# Patient Record
Sex: Female | Born: 1960
Health system: Southern US, Community
[De-identification: ages and names within clinical notes are randomized; demographics above are authoritative.]

## PROBLEM LIST (undated history)

## (undated) DIAGNOSIS — K589 Irritable bowel syndrome without diarrhea: Secondary | ICD-10-CM

## (undated) DIAGNOSIS — K259 Gastric ulcer, unspecified as acute or chronic, without hemorrhage or perforation: Secondary | ICD-10-CM

## (undated) DIAGNOSIS — I480 Paroxysmal atrial fibrillation: Secondary | ICD-10-CM

## (undated) DIAGNOSIS — Z860100 Personal history of colon polyps, unspecified: Secondary | ICD-10-CM

## (undated) DIAGNOSIS — K3184 Gastroparesis: Secondary | ICD-10-CM

## (undated) DIAGNOSIS — R0600 Dyspnea, unspecified: Secondary | ICD-10-CM

## (undated) DIAGNOSIS — E785 Hyperlipidemia, unspecified: Secondary | ICD-10-CM

## (undated) DIAGNOSIS — E079 Disorder of thyroid, unspecified: Secondary | ICD-10-CM

## (undated) DIAGNOSIS — F32A Depression, unspecified: Secondary | ICD-10-CM

## (undated) DIAGNOSIS — G4700 Insomnia, unspecified: Secondary | ICD-10-CM

## (undated) DIAGNOSIS — K8020 Calculus of gallbladder without cholecystitis without obstruction: Secondary | ICD-10-CM

## (undated) DIAGNOSIS — F419 Anxiety disorder, unspecified: Secondary | ICD-10-CM

## (undated) DIAGNOSIS — K219 Gastro-esophageal reflux disease without esophagitis: Secondary | ICD-10-CM

## (undated) DIAGNOSIS — K5792 Diverticulitis of intestine, part unspecified, without perforation or abscess without bleeding: Secondary | ICD-10-CM

## (undated) DIAGNOSIS — R079 Chest pain, unspecified: Secondary | ICD-10-CM

## (undated) DIAGNOSIS — G43909 Migraine, unspecified, not intractable, without status migrainosus: Secondary | ICD-10-CM

## (undated) DIAGNOSIS — T7840XA Allergy, unspecified, initial encounter: Secondary | ICD-10-CM

## (undated) DIAGNOSIS — R569 Unspecified convulsions: Secondary | ICD-10-CM

## (undated) DIAGNOSIS — I639 Cerebral infarction, unspecified: Secondary | ICD-10-CM

## (undated) DIAGNOSIS — Z950 Presence of cardiac pacemaker: Secondary | ICD-10-CM

## (undated) DIAGNOSIS — R55 Syncope and collapse: Secondary | ICD-10-CM

## (undated) DIAGNOSIS — K635 Polyp of colon: Secondary | ICD-10-CM

## (undated) DIAGNOSIS — R519 Headache, unspecified: Secondary | ICD-10-CM

## (undated) DIAGNOSIS — R011 Cardiac murmur, unspecified: Secondary | ICD-10-CM

## (undated) DIAGNOSIS — D649 Anemia, unspecified: Secondary | ICD-10-CM

## (undated) DIAGNOSIS — F329 Major depressive disorder, single episode, unspecified: Secondary | ICD-10-CM

## (undated) HISTORY — DX: Unspecified convulsions: R56.9

## (undated) HISTORY — PX: COLONOSCOPY: SHX174

## (undated) HISTORY — PX: UPPER GASTROINTESTINAL ENDOSCOPY: SHX188

## (undated) HISTORY — DX: Dyspnea, unspecified: R06.00

## (undated) HISTORY — DX: Diverticulitis of intestine, part unspecified, without perforation or abscess without bleeding: K57.92

## (undated) HISTORY — DX: Gastro-esophageal reflux disease without esophagitis: K21.9

## (undated) HISTORY — DX: Gastric ulcer, unspecified as acute or chronic, without hemorrhage or perforation: K25.9

## (undated) HISTORY — DX: Headache, unspecified: R51.9

## (undated) HISTORY — DX: Cardiac murmur, unspecified: R01.1

## (undated) HISTORY — DX: Migraine, unspecified, not intractable, without status migrainosus: G43.909

## (undated) HISTORY — DX: Gastroparesis: K31.84

## (undated) HISTORY — DX: Paroxysmal atrial fibrillation: I48.0

## (undated) HISTORY — DX: Hyperlipidemia, unspecified: E78.5

## (undated) HISTORY — PX: ELECTROPHYSIOLOGY STUDY: EP1205

## (undated) HISTORY — DX: Depression, unspecified: F32.A

## (undated) HISTORY — PX: BREAST EXCISIONAL BIOPSY: SUR124

## (undated) HISTORY — DX: Personal history of colon polyps, unspecified: Z86.0100

## (undated) HISTORY — DX: Anemia, unspecified: D64.9

## (undated) HISTORY — DX: Syncope and collapse: R55

## (undated) HISTORY — DX: Allergy, unspecified, initial encounter: T78.40XA

## (undated) HISTORY — DX: Irritable bowel syndrome, unspecified: K58.9

## (undated) HISTORY — DX: Disorder of thyroid, unspecified: E07.9

## (undated) HISTORY — DX: Chest pain, unspecified: R07.9

## (undated) HISTORY — DX: Calculus of gallbladder without cholecystitis without obstruction: K80.20

---
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
## 1982-10-27 HISTORY — PX: APPENDECTOMY: SHX54

## 1991-10-28 HISTORY — PX: ABDOMINAL HYSTERECTOMY: SHX81

## 1995-10-28 DIAGNOSIS — I639 Cerebral infarction, unspecified: Secondary | ICD-10-CM

## 1995-10-28 HISTORY — DX: Cerebral infarction, unspecified: I63.9

## 2008-10-27 DIAGNOSIS — G4700 Insomnia, unspecified: Secondary | ICD-10-CM | POA: Insufficient documentation

## 2008-10-27 HISTORY — PX: CHOLECYSTECTOMY: SHX55

## 2010-10-27 HISTORY — PX: CARDIAC CATHETERIZATION: SHX172

## 2010-10-27 HISTORY — PX: ATRIAL ABLATION SURGERY: SHX560

## 2011-04-01 DIAGNOSIS — F4310 Post-traumatic stress disorder, unspecified: Secondary | ICD-10-CM | POA: Insufficient documentation

## 2011-10-28 HISTORY — PX: LOOP RECORDER IMPLANT: SHX5954

## 2012-08-27 DIAGNOSIS — I480 Paroxysmal atrial fibrillation: Secondary | ICD-10-CM | POA: Insufficient documentation

## 2013-05-06 DIAGNOSIS — K589 Irritable bowel syndrome without diarrhea: Secondary | ICD-10-CM | POA: Insufficient documentation

## 2014-05-05 DIAGNOSIS — K3184 Gastroparesis: Secondary | ICD-10-CM | POA: Insufficient documentation

## 2014-05-05 DIAGNOSIS — K219 Gastro-esophageal reflux disease without esophagitis: Secondary | ICD-10-CM | POA: Insufficient documentation

## 2014-09-06 HISTORY — PX: PACEMAKER INSERTION: SHX728

## 2016-10-27 DIAGNOSIS — K635 Polyp of colon: Secondary | ICD-10-CM

## 2016-10-27 HISTORY — DX: Polyp of colon: K63.5

## 2018-01-27 LAB — HM MAMMOGRAPHY

## 2018-01-29 LAB — BASIC METABOLIC PANEL
BUN: 10 (ref 4–21)
CO2: 26 — AB (ref 13–22)
Chloride: 108 (ref 99–108)
Creatinine: 0.6 (ref <=1.1)
Glucose: 95
Potassium: 4.1 (ref 3.4–5.3)
Sodium: 140 (ref 137–147)

## 2018-01-29 LAB — CBC AND DIFFERENTIAL
HCT: 40 (ref 36–46)
Hemoglobin: 13.6 (ref 12.0–16.0)
Platelets: 221 (ref 150–399)
WBC: 11

## 2018-01-29 LAB — TSH: TSH: 0.66 (ref <=5.90)

## 2018-01-29 LAB — LIPID PANEL
Cholesterol: 168 (ref 0–200)
HDL: 50 (ref 35–70)
LDL Cholesterol: 96
Triglycerides: 111 (ref 40–160)

## 2018-01-29 LAB — HEPATIC FUNCTION PANEL
ALT: 15 (ref 7–35)
AST: 21 (ref 13–35)
Alkaline Phosphatase: 75.1 (ref 25–125)

## 2018-01-29 LAB — CBC: RBC: 4.3 (ref 3.87–5.11)

## 2018-05-07 LAB — HM PAP SMEAR

## 2018-08-19 LAB — HM DEXA SCAN

## 2019-01-13 ENCOUNTER — Encounter (HOSPITAL_COMMUNITY): Payer: Self-pay

## 2019-01-13 ENCOUNTER — Other Ambulatory Visit: Payer: Self-pay

## 2019-01-13 ENCOUNTER — Ambulatory Visit (HOSPITAL_COMMUNITY)
Admission: EM | Admit: 2019-01-13 | Discharge: 2019-01-13 | Disposition: A | Discharge status: Home / Self Care | Payer: Self-pay | Attending: Family Medicine | Admitting: Family Medicine

## 2019-01-13 DIAGNOSIS — R05 Cough: Secondary | ICD-10-CM

## 2019-01-13 DIAGNOSIS — J029 Acute pharyngitis, unspecified: Secondary | ICD-10-CM | POA: Insufficient documentation

## 2019-01-13 HISTORY — DX: Insomnia, unspecified: G47.00

## 2019-01-13 HISTORY — DX: Anxiety disorder, unspecified: F41.9

## 2019-01-13 HISTORY — DX: Presence of cardiac pacemaker: Z95.0

## 2019-01-13 LAB — POCT RAPID STREP A: Streptococcus, Group A Screen (Direct): NEGATIVE

## 2019-01-13 MED ORDER — BENZONATATE 100 MG PO CAPS: 100.0000 mg | ORAL_CAPSULE | Freq: Three times a day (TID) | ORAL | 0 refills | Status: DC

## 2019-01-13 MED ORDER — LIDOCAINE VISCOUS HCL 2 % MT SOLN: 15.0000 mL | OROMUCOSAL | 0 refills | Status: DC | PRN

## 2019-01-13 NOTE — ED Provider Notes (Addendum)
[facility]    CSN: [id number] Arrival date & time: 01/13/19  9628     History   Chief Complaint Chief Complaint  Patient presents with  . Sore Throat    HPI Dana Harvey is a 58 y.o. female.   Pt is a 58 year old female that presents with sore throat, dry cough x 5 days. Symptoms have been constant and remain the same. She has been doing salt water gargles for her symptoms. Pain with swallowing. 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 No recent traveling. No reported fever but she has had some chills and night sweat. Reports some fatigue. No chest congestion, chest pain, shortness of breath. Her and her husband recently moved here from [place] and have been cleaning an old house where there has been some exposure to dust and mold.   ROS per HPI    Sore Throat     Past Medical History:  Diagnosis Date  . Anxiety   . Insomnia   . Pacemaker     There are no active problems to display for this patient.   Past Surgical History:  Procedure Laterality Date  . ABDOMINAL HYSTERECTOMY    . APPENDECTOMY    . CESAREAN SECTION    . CHOLECYSTECTOMY      OB History   No obstetric history on file.      Home Medications    Prior to Admission medications   Medication Sig Start Date End Date Taking? Authorizing Provider  ALPRAZolam Prudy Feeler) 0.5 MG tablet Take 0.5 mg by mouth at bedtime as needed for anxiety.   Yes [provider]  zolpidem (AMBIEN) 5 MG tablet Take 10 mg by mouth at bedtime as needed for sleep.   Yes [provider]  benzonatate (TESSALON) 100 MG capsule Take 1 capsule (100 mg total) by mouth every 8 (eight) hours. 01/13/19   Maverick Dieudonne, Gloris Manchester A, NP  lidocaine (XYLOCAINE) 2 % solution Use as directed 15 mLs in the mouth or throat as needed for mouth pain. 01/13/19   Janace Aris, NP    Family History Family History  Problem Relation Age of Onset  . Osteoarthritis Mother   . Cancer Father   . Diabetes  Father     Social History Social History   Tobacco Use  . Smoking status: Never Smoker  . Smokeless tobacco: Never Used  Substance Use Topics  . Alcohol use: Never    Frequency: Never  . Drug use: Never     Allergies   Penicillins and Aspirin   Review of Systems Review of Systems   Physical Exam Triage Vital Signs ED Triage Vitals  Enc Vitals Group     BP 01/13/19 0835 118/83     Pulse Rate 01/13/19 0835 70     Resp 01/13/19 0835 18     Temp 01/13/19 0835 97.8 F (36.6 C)     Temp Source 01/13/19 0835 Oral     SpO2 01/13/19 0835 99 %     Weight 01/13/19 0824 121 lb (54.9 kg)     Height --      Head Circumference --      Peak Flow --      Pain Score 01/13/19 0824 4     Pain Loc --      Pain Edu? --      Excl. in GC? --    No data found.  Updated Vital Signs BP 118/83 (BP Location:  Right Arm)   Pulse 70   Temp 97.8 F (36.6 C) (Oral)   Resp 18   Wt 121 lb (54.9 kg)   SpO2 99%   Visual Acuity Right Eye Distance:   Left Eye Distance:   Bilateral Distance:    Right Eye Near:   Left Eye Near:    Bilateral Near:     Physical Exam Vitals signs and nursing note reviewed.  Constitutional:      General: She is not in acute distress.    Appearance: She is well-developed. She is not toxic-appearing.  HENT:     Head: Normocephalic.     Right Ear: Tympanic membrane and ear canal normal.     Left Ear: Tympanic membrane and ear canal normal.     Nose: Congestion present.     Mouth/Throat:     Pharynx: Posterior oropharyngeal erythema present.     Tonsils: 0 on the right. 0 on the left.  Eyes:     Conjunctiva/sclera: Conjunctivae normal.  Neck:     Musculoskeletal: Normal range of motion.  Cardiovascular:     Rate and Rhythm: Normal rate and regular rhythm.  Pulmonary:     Effort: Pulmonary effort is normal.     Breath sounds: Normal breath sounds.  Lymphadenopathy:     Cervical: No cervical adenopathy.  Skin:    General: Skin is warm and dry.   Neurological:     Mental Status: She is alert.  Psychiatric:        Mood and Affect: Mood normal.      UC Treatments / Results  Labs (all labs ordered are listed, but only abnormal results are displayed) Labs Reviewed  CULTURE, GROUP A STREP [facility])  POCT RAPID STREP A    EKG None  Radiology No results found.  Procedures Procedures (including critical care time)  Medications Ordered in UC Medications - No data to display  Initial Impression / Assessment and Plan / UC Course  I have reviewed the triage vital signs and the nursing notes.  Pertinent labs & imaging results that were available during my care of the patient were reviewed by me and considered in my medical decision making (see chart for details).     Rapid strep test negative Symptoms consistent with viral illness  Lidocaine for sore throat as needed Continue the salt water gargles.  Tessalon Perles for cough Follow up as needed for continued or worsening symptoms  Final Clinical Impressions(s) / UC Diagnoses   Final diagnoses:  Sore throat     Discharge Instructions     Your rapid strep test was negative This is most likely some sort of viral illness We gave you a lidocaine gargle and spit to help with the discomfort in your throat Tessalon Perles for cough You can take ibuprofen or Tylenol as needed Follow up as needed for continued or worsening symptoms     ED Prescriptions    Medication Sig Dispense Auth. Provider   lidocaine (XYLOCAINE) 2 % solution Use as directed 15 mLs in the mouth or throat as needed for mouth pain. 100 mL Chay Mazzoni A, NP   benzonatate (TESSALON) 100 MG capsule Take 1 capsule (100 mg total) by mouth every 8 (eight) hours. 21 capsule Dahlia Byes A, NP     Controlled Substance Prescriptions [place] Controlled Substance Registry consulted? Not Applicable       Janace Aris, NP 01/13/19 1040

## 2019-01-13 NOTE — ED Triage Notes (Signed)
Pt cc sore throat , coughing this has been going on for 5 days.

## 2019-01-13 NOTE — Discharge Instructions (Signed)
Your rapid strep test was negative This is most likely some sort of viral illness We gave you a lidocaine gargle and spit to help with the discomfort in your throat Tessalon Perles for cough You can take ibuprofen or Tylenol as needed Follow up as needed for continued or worsening symptoms

## 2019-01-15 LAB — CULTURE, GROUP A STREP (THRC)

## 2019-01-17 ENCOUNTER — Telehealth (HOSPITAL_COMMUNITY): Payer: Self-pay | Admitting: Emergency Medicine

## 2019-01-17 MED ORDER — AZITHROMYCIN 250 MG PO TABS: 250.0000 mg | ORAL_TABLET | Freq: Every day | ORAL | 0 refills | Status: DC

## 2019-03-02 ENCOUNTER — Encounter: Payer: Self-pay | Admitting: Family Medicine

## 2019-03-16 ENCOUNTER — Encounter: Payer: Self-pay | Admitting: Family Medicine

## 2019-04-01 ENCOUNTER — Encounter: Payer: Self-pay | Admitting: Family Medicine

## 2019-04-01 ENCOUNTER — Ambulatory Visit (INDEPENDENT_AMBULATORY_CARE_PROVIDER_SITE_OTHER): Payer: BC Managed Care – PPO | Admitting: Family Medicine

## 2019-04-01 ENCOUNTER — Other Ambulatory Visit: Payer: Self-pay

## 2019-04-01 ENCOUNTER — Telehealth: Payer: Self-pay | Admitting: *Deleted

## 2019-04-01 VITALS — BP 95/67 | HR 70 | Temp 98.6°F | Ht 67.0 in | Wt 122.2 lb

## 2019-04-01 DIAGNOSIS — I959 Hypotension, unspecified: Secondary | ICD-10-CM

## 2019-04-01 DIAGNOSIS — K3184 Gastroparesis: Secondary | ICD-10-CM

## 2019-04-01 DIAGNOSIS — Z131 Encounter for screening for diabetes mellitus: Secondary | ICD-10-CM

## 2019-04-01 DIAGNOSIS — F419 Anxiety disorder, unspecified: Secondary | ICD-10-CM | POA: Insufficient documentation

## 2019-04-01 DIAGNOSIS — G4700 Insomnia, unspecified: Secondary | ICD-10-CM

## 2019-04-01 DIAGNOSIS — R6889 Other general symptoms and signs: Secondary | ICD-10-CM

## 2019-04-01 DIAGNOSIS — Z13220 Encounter for screening for lipoid disorders: Secondary | ICD-10-CM

## 2019-04-01 DIAGNOSIS — R5383 Other fatigue: Secondary | ICD-10-CM

## 2019-04-01 DIAGNOSIS — M8580 Other specified disorders of bone density and structure, unspecified site: Secondary | ICD-10-CM

## 2019-04-01 DIAGNOSIS — R142 Eructation: Secondary | ICD-10-CM

## 2019-04-01 DIAGNOSIS — Z950 Presence of cardiac pacemaker: Secondary | ICD-10-CM | POA: Diagnosis not present

## 2019-04-01 MED ORDER — ZOLPIDEM TARTRATE ER 6.25 MG PO TBCR: 6.2500 mg | EXTENDED_RELEASE_TABLET | Freq: Every evening | ORAL | 0 refills | Status: DC | PRN

## 2019-04-01 MED ORDER — PANTOPRAZOLE SODIUM 40 MG PO TBEC: 40.0000 mg | DELAYED_RELEASE_TABLET | Freq: Every day | ORAL | 1 refills | Status: DC

## 2019-04-01 NOTE — Telephone Encounter (Signed)
Per Dr Hassan Rowan, I left a detailed message at the pts cell number to return a call to schedule a follow up virtual visit (30 minute visit) for next week.  I also left a message stating Dr Hassan Rowan sent in a refill for Ambien, and if she needs any additional refills to leave a message with this info.

## 2019-04-01 NOTE — Progress Notes (Signed)
Virtual Visit via Video Note  I connected with Dana Harvey   on 04/01/19 at  3:15 PM EDT by a video enabled telemedicine application and verified that I am speaking with the correct person using two identifiers.  Location patient: home Location provider:work office Persons participating in the virtual visit: patient, provider  I discussed the limitations of evaluation and management by telemedicine and the availability of in person appointments. The patient expressed understanding and agreed to proceed.   Dana Harvey DOB: 1961-09-22 Encounter date: 04/01/2019  This is a 58 y.o. female who presents to establish care. Chief Complaint  Patient presents with  . Establish Care    History of present illness: Just moved here in Feb. Was in [place] previously.   Having some blood pressure issues today. 95/67, 93/64 w HR 81, 84/71 HR 79 then 77/50 HR 78, 90/66. Feels like she is dehydrated.  She has had a significant number of stools today which is not uncommon for her to occur episodically for long periods of constipation; feeling worse as day is going on. Has pacemaker so feels like heart shouldn't be racing. Husband is bringing home gatorade for her so she is going to try to rehydrate and will monitor.   Stomach bothering her so much she left work early. Has IBS flares occasionally. Sometimes won't go to bathroom (BM) for 2 weeks. Then is miserable when she finally starts to have BM. Burping frequently. Hasn't had regular GI treating her. Tries to manage with diet. Avoids red meat, tries to stay with softer foods. Has rx for linzess which works, but it makes her go too frequently. Can take this on weekend but doesn't always work when she needs. Dose up to . Gas pressure gets extreme - to point of feeling like she gets chest pain. Has been having BM all day. Took linzess last Saturday. That is only time she can usually tolerate and gives her time to get to bathroom.  When she has BM then she is having diarrhea and vomits as well. Lower doses of Linzess didn't work for her. Lost appetite in 2018. Increased stress with job caused this. Just never got back appetite. Hasn't regained weight since that time. Weight has been stable since that time.   Anxiety/stress: Tried amitriptyline but had suicidal thoughts by second day of medication. Anxiety level is always very elevated. Only sleeping 2-3 hours/night. So tired. In last year sleep has been worst. Takes the zolpidem right when she goes to sleep which helps her fall asleep but wakes at midnight. Goes to sleep at 8pm, wakes at 5am every morning. Tossing/turning, not rested; feels so extremely tired. Ambien is only thing she has tried for sleep in past. Did try to add melatonin but it didn't help - felt like it kept her more wired. (  at onset and then would repeat if woke up)  In 2011 daughter was deployed and became MIA. Went to primary care, stated wasn't feeling well. Not sleeping, extreme fatigue. Dx with fibromyalgia, chronic fatigue syndrome at that time. Started on lyrica which compounded her from being tired to gaining weight and feeling more tired. Then did get depressed. Then started with heart racing. EKG at primary care showed a fib. HR was jumping up to 180. Had atrial node ablation x 6 months, but then sx came back. States she was completely disabled at this point. Started vomiting every time she ate. Started to worry that husband was poisoning her; went to ER  twice weekly. Ended up following with GI and dx with gatroparesis. Was on multiple pain medications from all of providers that she was seeing (including 17 pain medications). Worked to get self off of all medications. Increased anxiety coming off medications. Heart issues didn't improve off of medications. Went back for ablation but was "overablated" and ended up with pacemaker. Every symptom she had felt better once she got pacemaker.      Past Medical  History:  Diagnosis Date  . Anxiety   . CVA (cerebral vascular accident) (HCC) 1997  . Depression   . Diverticulitis   . Fainting spell   . Frequent headaches   . GERD (gastroesophageal reflux disease)   . Heart murmur   . Insomnia   . Migraines   . Pacemaker   . Polyp of intestine 2018   benign per patient  . Seizures (HCC)   . Thyroid disease    Past Surgical History:  Procedure Laterality Date  . ABDOMINAL HYSTERECTOMY  1993  . APPENDECTOMY  1984  . ATRIAL ABLATION SURGERY  2012  . BREAST BIOPSY    . CESAREAN SECTION     [phone]  . CHOLECYSTECTOMY  2010  . PACEMAKER INSERTION  09/06/2014   Dr Onalee Hua [place]--per patient   Allergies  Allergen Reactions  . Penicillins Anaphylaxis  . Aspirin Tinitus  . Nystatin     Cough   Current Meds  Medication Sig  . ALPRAZolam (XANAX) 0.5 MG tablet Take 0.5 mg by mouth 2 (two) times daily.   Marland Kitchen atorvastatin (LIPITOR) 40 MG tablet Take 40 mg by mouth daily.  . pantoprazole (PROTONIX) 40 MG tablet Take 1 tablet (40 mg total) by mouth daily.  Marland Kitchen VITAMIN E PO Take 180 mg by mouth daily.  . [DISCONTINUED] MELATONIN PO Take 5 mg by mouth daily as needed.  . [DISCONTINUED] pantoprazole (PROTONIX) 40 MG tablet Take 40 mg by mouth daily.  . [DISCONTINUED] pantoprazole sodium (PROTONIX) 40 mg/20 mL PACK   . [DISCONTINUED] zolpidem (AMBIEN) 5 MG tablet Take 10 mg by mouth at bedtime as needed for sleep.   Social History   Tobacco Use  . Smoking status: Former Games developer  . Smokeless tobacco: Never Used  . Tobacco comment: quit 37 years ago  Substance Use Topics  . Alcohol use: Never    Frequency: Never   Family History  Problem Relation Age of Onset  . Osteoarthritis Mother   . Alcohol abuse Mother   . Arthritis Mother   . COPD Mother   . Depression Mother   . Hearing loss Mother   . Hyperlipidemia Mother   . Hypertension Mother   . Cancer Father   . Diabetes Father   . Heart disease Father   . Hypertension Father    . Alcohol abuse Brother   . Cancer Brother   . Depression Brother   . Drug abuse Brother   . Alcohol abuse Son   . Drug abuse Son   . Early death Maternal Grandmother 109  . Heart disease Maternal Grandmother   . Alcohol abuse Maternal Grandfather   . Heart disease Maternal Grandfather 70  . Cancer Paternal Grandmother   . Heart disease Paternal Grandmother   . Cancer Paternal Grandfather   . Heart disease Paternal Grandfather   . Alcohol abuse Brother   . Depression Brother   . Diabetes Brother   . Drug abuse Brother   . Hypertension Brother   . Hyperlipidemia Brother      Review  of Systems  Constitutional: Positive for appetite change. Negative for chills, fatigue and fever.  Respiratory: Negative for cough, chest tightness, shortness of breath and wheezing.   Cardiovascular: Positive for palpitations. Negative for chest pain and leg swelling.       Feels like heart is going faster than normal.  Gastrointestinal: Positive for abdominal distention, abdominal pain, constipation and vomiting.  Endocrine: Positive for cold intolerance.       States she is always extremely cold.  Even when it is very hot outside she is wearing multiple layers.  She comes in from working gets in bed under the covers to try and stay warm.  Neurological: Positive for light-headedness.  Psychiatric/Behavioral: Positive for decreased concentration and sleep disturbance. Negative for suicidal ideas. The patient is nervous/anxious.     Objective:  BP 95/67 Comment: all vitals taken by pt-jaf  Pulse 70   Temp 98.6 F (37 C)   Ht 5\' 7"  (1.702 m)   Wt 122 lb 3.2 oz (55.4 kg)   BMI 19.14 kg/m   Weight: 122 lb 3.2 oz (55.4 kg)   BP Readings from Last 3 Encounters:  04/01/19 95/67  01/13/19 118/83   Wt Readings from Last 3 Encounters:  04/01/19 122 lb 3.2 oz (55.4 kg)  01/13/19 121 lb (54.9 kg)    EXAM:  GENERAL: alert, oriented, appears well and in no acute distress  HEENT: atraumatic,  conjunctiva clear, no obvious abnormalities on inspection of external nose and ears  NECK: normal movements of the head and neck  LUNGS: on inspection no signs of respiratory distress, breathing rate appears normal, no obvious gross SOB, gasping or wheezing  CV: no obvious cyanosis  MS: moves all visible extremities without noticeable abnormality  PSYCH/NEURO: pleasant and cooperative, no obvious depression or anxiety, speech and thought processing grossly intact.  SKIN: No noted abnormalities of face or neck.  Assessment/Plan  1. Gastroparesis This is one of her primary concerns.  Although the Linzess has been helpful to achieve a bowel movement, it has not been helpful for her long-term to have regular bowel movements.  She has in the past been on Reglan, but this was for nausea.  Uncertain if it helped to have regular bowel movements.  I think it is reasonable with the longevity of symptoms and severity of symptoms for her to see GI.  She does have records from a endoscopy and colonoscopy that she will try to bring either to us or to GI. - Ambulatory referral to Gastroenterology  2. Hypotension, unspecified hypotension type This may be related to increase in bowel movements today.  Encouraged her to work on continuing to rehydrate and continue to monitor blood pressure.  She does not have to check as often as she has been, but twice a day would be helpful to get baseline.  Call us if any lowering of blood pressure or any additional symptoms.  We will get some blood work since it has been a year since she last completed blood work and can check in with her on how blood pressures are doing at that time.  3. Pacemaker She will need to follow with electrophysiology due to implanted pacemaker.  She is not due for a follow-up visit at this immediate time. - Ambulatory referral to Cardiac Electrophysiology - Ambulatory referral to Cardiology  4. Burping See above.  I suspect are related to  slow gut. - Ambulatory referral to Gastroenterology  5. Insomnia, unspecified type We are going to try a  longer acting Ambien to see if she can achieve some latency of sleep.  We did discuss trying a noncontrolled substance (like trazodone) to help with sleep as well.  Since she tolerates the Ambien and has good sleep onset with this we will try a long-acting version first and then can try trazodone pending her response to this.  I do also suspect that if we can get her improved sleep that anxiety during the day will be better. - zolpidem (AMBIEN CR) 6.25 MG CR tablet; Take 1 tablet (6.25 mg total) by mouth at bedtime as needed for sleep.  Dispense: 30 tablet; Refill: 0  6. Fatigue, unspecified type Issue for her.  She feels it is been worse in the last year.  Appetite is poor and she eats minimally due to GI issues, so I think it is pertinent to check vitamin levels. - CBC with Differential/Platelet; Future - Comprehensive metabolic panel; Future - TSH; Future - T4, free; Future - T3, free; Future - VITAMIN D 25 Hydroxy (Vit-D Deficiency, Fractures); Future - Vitamin B12; Future  7. Lipid screening Has been on Lipitor.  Will recheck baseline. - Lipid panel; Future  8. Screening for diabetes mellitus  - Hemoglobin A1c; Future  9. Cold intolerance  - TSH; Future  10. Osteopenia, unspecified location Could consider repeat scan in 2024.  Recommend weightbearing exercise and adequate vitamin D and calcium.  11. Anxiety: We did discuss anxiety and some amount of detail today.  However, since she has multiple other medical issues, we will need to set up follow-up visit to complete discussion.  We did discuss that chronic use of Xanax is not ideal.  She is willing to try other medication to help with anxiety.  We will also see if this improves at all with a longer acting Ambien for better sleep.  Will discuss at follow-up appointment.   I discussed the assessment and treatment plan with  the patient. The patient was provided an opportunity to ask questions and all were answered. The patient agreed with the plan and demonstrated an understanding of the instructions.   The patient was advised to call back or seek an in-person evaluation if the symptoms worsen or if the condition fails to improve as anticipated.  I provided 40 minutes of non-face-to-face time during this encounter.  I was able to reach her again at end of day. Please schedule bloodwork for her to complete and then follow up 30 minute visit for lab review and anxiety discussion. (we were disconnected but able to reconnect and complete discussion and plan for followup)  Theodis Shove, MD

## 2019-04-04 ENCOUNTER — Other Ambulatory Visit: Payer: Self-pay | Admitting: Family Medicine

## 2019-04-04 MED ORDER — TRAZODONE HCL 100 MG PO TABS: 100.0000 mg | ORAL_TABLET | Freq: Every evening | ORAL | 2 refills | Status: DC | PRN

## 2019-04-06 ENCOUNTER — Telehealth: Payer: Self-pay

## 2019-04-06 ENCOUNTER — Ambulatory Visit: Payer: Self-pay | Admitting: Family Medicine

## 2019-04-06 NOTE — Telephone Encounter (Signed)
LMOVM for pt to return call to DC. 

## 2019-04-22 ENCOUNTER — Ambulatory Visit (INDEPENDENT_AMBULATORY_CARE_PROVIDER_SITE_OTHER): Payer: BC Managed Care – PPO | Admitting: Gastroenterology

## 2019-04-22 ENCOUNTER — Encounter: Payer: Self-pay | Admitting: Gastroenterology

## 2019-04-22 VITALS — Ht 67.0 in | Wt 122.0 lb

## 2019-04-22 DIAGNOSIS — K5900 Constipation, unspecified: Secondary | ICD-10-CM

## 2019-04-22 DIAGNOSIS — K3184 Gastroparesis: Secondary | ICD-10-CM

## 2019-04-22 MED ORDER — MOTEGRITY 2 MG PO TABS: 1.0000 | ORAL_TABLET | Freq: Every day | ORAL | 3 refills | Status: DC

## 2019-04-22 NOTE — Progress Notes (Signed)
TELEHEALTH VISIT  Referring Provider: Wynn BankerKoberlein, Junell C, MD Primary Care Physician:  Patient, No Pcp Per   Tele-visit due to COVID-19 pandemic Patient requested visit virtually, consented to the virtual encounter via video enabled telemedicine application Contact made at:  09:00 04/22/19 Patient verified by name and date of birth Location of patient: Home Location provider: [facility] Names of persons participating: Me, patient, Deneise LeverDesiree Brown CMA Time spent on telehealth visit: 34 minutes I discussed the limitations of evaluation and management by telemedicine. The patient expressed understanding and agreed to proceed.  Reason for Consultation: Gastroparesis   IMPRESSION:  Gastroparesis with ongoing nausea and eructation     - diagnosed by prior gastroenterologists    - responded to domperidone, until it was removed from the market  Constipation - Minimal response to Linzess (diarrhea), no response to Amitiza (hypotension) Intolerant of amitryptlene due to suicidal ideation (used to treat anxiety) Patient reported history of colon polyps on colonoscopy 2018  Suspected diffuse GI motility disorder.  Trial of Motegrity recommended to treat both her known gastroparesis and constipation.  My only concern is her ongoing nausea.  If she does not tolerate the 2 mg dose will reduce to 1 mg daily.  Obtain prior records to review her diagnosis.  Given her ongoing symptoms she may benefit from repeat endoscopic evaluation with esophageal, gastric, and duodenal biopsies if these have not already been performed.  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 Hassan RowanKoberlein for further evaluation of gastroparesis.  The history is obtained through the patient and review of her electronic health record. Moved to [place] in February.  She has a history of fibromyalgia, chronic fatigue syndrome, anxiety, IBS.   Diagnosed with gastroparesis in 2011 at the time of severe stress around her daughter going MIA when she was deployed. [facility] told her that narcotics had paralyzed her stomach. Treated with domperidone but then she couldn't get it in the [place] any more. However, she found that her symptoms dramatically improved.   Constipation frequently going 2 weeks between bowel movements.  Treated with Linzess which works but then results in diarrhea. She finds that she is vomiting while she is defecating.  This prevents her from using the medication during the week when she has to work.  Lower dose of Linzess did not provide symptomatic relief. She did not tolerate Amitiza due to hypotension.   Constant, abdominal pain in the upper mid-abomen. Chronic nausea. Frequent eructation, especially in the morning.  Lost her appetite in 2018 during a time of job stress. She saw another gastroenterologist at that time in [place], [place], Dr. Cameron AliEric Fontenot [phone]), and he did an EGD and colonoscopy. She had some polyps removed. He told her she would have to return to [place] because he didn't treat gastroparesis. She saw another local gastroenterologist for a second opinion, Dr. Laurence SlateNiazy Selim in [place], [place] [phone]).  She had a gastric emptying scan at that time that confirmed the diagnosis.  She was put on a gastroparesis diet. Has not regained the weight that she lost in 2018.  Weight has recently been stable.  No other associated symptoms. No identified exacerbating or relieving features.   Previously on amitrytlene for anxiety and stress but had suicidal thoughts by the second day  of the medication.  Father with colon cancer diagnosed in his 6860s. Three paternal uncles with colon cancer.  No other known family history of colon cancer or polyps. No family history of uterine/endometrial cancer, pancreatic cancer or gastric/stomach cancer.  Past Medical History:  Diagnosis Date  . Anxiety   . CVA (cerebral vascular accident) (HCC) 1997  . Depression   . Diverticulitis   . Fainting spell   . Frequent headaches   . GERD (gastroesophageal reflux disease)   . Heart murmur   . Insomnia   . Migraines   . Pacemaker   . Polyp of intestine 2018   benign per patient  . Seizures (HCC)   . Thyroid disease     Past Surgical History:  Procedure Laterality Date  . ABDOMINAL HYSTERECTOMY  1993  . APPENDECTOMY  1984  . ATRIAL ABLATION SURGERY  2012  . BREAST BIOPSY    . CESAREAN SECTION     [phone],1984,1985  . CHOLECYSTECTOMY  2010  . PACEMAKER INSERTION  09/06/2014   Dr Onalee Huaavid [place]--per patient    Current Outpatient Medications  Medication Sig Dispense Refill  . ALPRAZolam (XANAX) 0.5 MG tablet Take 0.5 mg by mouth 2 (two) times daily.     Marland Kitchen. atorvastatin (LIPITOR) 40 MG tablet Take 40 mg by mouth daily.    . pantoprazole (PROTONIX) 40 MG tablet Take 1 tablet (40 mg total) by mouth daily. 90 tablet 1  . traZODone (DESYREL) 100 MG tablet Take 1 tablet (100 mg total) by mouth at bedtime as needed for sleep. 30 tablet 2  . VITAMIN E PO Take 180 mg by mouth daily.     No current facility-administered medications for this visit.     Allergies as of 04/22/2019 - Review Complete 04/22/2019  Allergen Reaction Noted  . Penicillins Anaphylaxis 01/13/2019  . Aspirin Tinitus 01/13/2019  . Nystatin  04/01/2019    Family History  Problem Relation Age of Onset  . Osteoarthritis Mother   . Alcohol abuse Mother   . Arthritis Mother   . COPD Mother   . Depression Mother   . Hearing loss Mother   . Hyperlipidemia Mother   . Hypertension Mother   . Cancer Father   .  Diabetes Father   . Heart disease Father   . Hypertension Father   . Alcohol abuse Brother   . Cancer Brother   . Depression Brother   . Drug abuse Brother   . Alcohol abuse Son   . Drug abuse Son   . Early death Maternal Grandmother [id number]  . Heart disease Maternal Grandmother   . Alcohol abuse Maternal Grandfather   . Heart disease Maternal Grandfather 70  . Cancer Paternal Grandmother   . Heart disease Paternal Grandmother   . Cancer Paternal Grandfather   . Heart disease Paternal Grandfather   . Alcohol abuse Brother   . Depression Brother   . Diabetes Brother   . Drug abuse Brother   . Hypertension Brother   . Hyperlipidemia Brother     Social History   Socioeconomic History  . Marital status: Married    Spouse name: Not on file  . Number of children: Not on file  . Years of education: Not on file  . Highest education level: Not on file  Occupational History  . Not on file  Social Needs  . Financial resource strain: Not on file  . Food insecurity    Worry: Not on file    Inability: Not on file  . Transportation needs    Medical: Not on file    Non-medical: Not on file  Tobacco Use  . Smoking status: Former Research scientist (life sciences)  . Smokeless tobacco: Never Used  . Tobacco comment: quit 37 years ago  Substance and Sexual Activity  . Alcohol use: Never    Frequency: Never  . Drug use: Never  . Sexual activity: Not on file  Lifestyle  . Physical activity    Days per week: Not on file    Minutes per session: Not on file  . Stress: Not on file  Relationships  . Social Herbalist on phone: Not on file    Gets together: Not on file    Attends religious service: Not on file    Active member of club or organization: Not on file    Attends meetings of clubs or organizations: Not on file    Relationship status: Not on file  . Intimate partner violence    Fear of current or ex partner: Not on file    Emotionally abused: Not on file    Physically abused: Not on file     Forced sexual activity: Not on file  Other Topics Concern  . Not on file  Social History Narrative  . Not on file    Review of Systems: ALL ROS discussed and all others negative except listed in HPI.  Physical Exam: Complete physical exam not performed due to the limits inherent in a telehealth encounter.  General: Awake, alert, and oriented, and well communicative. In no acute distress. Burping during our encounter.  HEENT: EOMI, non-icteric sclera, NCAT, MMM  Neck: Normal movement of head and neck  Pulm: No labored breathing, speaking in full sentences without conversational dyspnea  Abd: patient localizes pain to upper abdomen to the right of the umbilicus Derm: No apparent lesions or bruising in visible field  MS: Moves all visible extremities without noticeable abnormality  Psych: Pleasant, cooperative, normal speech, normal affect and normal insight Neuro: Alert and appropriate   Kaede Clendenen L. Tarri Glenn, MD, MPH [facility] 04/22/2019, 9:30 AM

## 2019-04-22 NOTE — Patient Instructions (Signed)
Please discontinue Linzess.  I am recommending a trial of  Motegrity (Prucalopride) 2 mg once daily.  We will work to obtain your prior GI results.  Let's check in again in 4-6 weeks, or earlier as needed.

## 2019-05-04 ENCOUNTER — Other Ambulatory Visit: Payer: Self-pay | Admitting: Family Medicine

## 2019-05-06 ENCOUNTER — Other Ambulatory Visit: Payer: Self-pay | Admitting: Family Medicine

## 2019-05-06 ENCOUNTER — Encounter: Payer: Self-pay | Admitting: Family Medicine

## 2019-05-06 MED ORDER — ALPRAZOLAM 0.5 MG PO TABS: 0.2500 mg | ORAL_TABLET | Freq: Two times a day (BID) | ORAL | 0 refills | Status: DC | PRN

## 2019-05-06 NOTE — Telephone Encounter (Signed)
I have sent in refill of xanax. I did change the rx to 0.5-1 tab as needed twice daily. I did just send 1 month in hopes that we will be able to decrease this at some point. Would like her to work on decreasing use of this and really evaluating need when she takes it. I do not like this medication long term and she did state she would be willing to discuss alternatives. Please schedule 30 minute chronic condition visit. We discussed a lot at last visit and there is more in her med history to go over as well as larger discussion of anxiety.

## 2019-05-06 NOTE — Telephone Encounter (Signed)
I called the pt and informed her of the message below.  Appt scheduled for 7/17.

## 2019-05-08 NOTE — Progress Notes (Signed)
Patient unable to keep her telehealth encounter scheduled with me this morning due to work obligations. She rescheduled.

## 2019-05-09 ENCOUNTER — Ambulatory Visit (INDEPENDENT_AMBULATORY_CARE_PROVIDER_SITE_OTHER): Payer: BC Managed Care – PPO | Admitting: Gastroenterology

## 2019-05-09 DIAGNOSIS — K3184 Gastroparesis: Secondary | ICD-10-CM

## 2019-05-13 ENCOUNTER — Encounter: Payer: Self-pay | Admitting: Family Medicine

## 2019-05-13 ENCOUNTER — Other Ambulatory Visit: Payer: Self-pay

## 2019-05-13 ENCOUNTER — Telehealth: Payer: Self-pay | Admitting: *Deleted

## 2019-05-13 ENCOUNTER — Ambulatory Visit (INDEPENDENT_AMBULATORY_CARE_PROVIDER_SITE_OTHER): Payer: BC Managed Care – PPO | Admitting: Family Medicine

## 2019-05-13 VITALS — BP 140/103 | HR 92 | Temp 98.6°F | Wt 122.0 lb

## 2019-05-13 DIAGNOSIS — F419 Anxiety disorder, unspecified: Secondary | ICD-10-CM | POA: Diagnosis not present

## 2019-05-13 DIAGNOSIS — R5383 Other fatigue: Secondary | ICD-10-CM

## 2019-05-13 NOTE — Progress Notes (Signed)
Virtual Visit via Video Note  I connected with Dana Harvey on 05/13/19 at  1:00 PM EDT by a video enabled telemedicine application and verified that I am speaking with the correct person using two identifiers.  Location patient: home Location provider:work or home office Persons participating in the virtual visit: patient, provider  I discussed the limitations of evaluation and management by telemedicine and the availability of in person appointments. The patient expressed understanding and agreed to proceed.   Dana Harvey DOB: 06/21/1961 Encounter date: 05/13/2019  This is a 58 y.o. female who presents with Chief Complaint  Patient presents with  . Follow-up    History of present illness: This week has been very hard for her. Son, who lives in [place] is using heroin. 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 Just more than she felt like she could handle.   Has been crying all week. BP 150/103 after work. HR feels constantly up. Using the alprazolam more because feeling like she can't breathe. Just feeling very overwhelmed. HR staying around 103. BP has always been  On lower end. Not having chest pain. Does have a little headache. Very anxious.   Is sleeping better than she was previously, but just can't put everything together. Doesn't want to get out of bed. Going back to church has been helpful.   Does get hot flashes. Has some menopausal sx. Feels like this has been going on for about 6 months.   CVA history: migraine when driving to work; threw up in car coming home. Then went home and lights went out at home. Couldn't see anything and head felt like something exploded. Initially told it was MS. Then lumbar puncture was one and she was told she had stroke.Lost 85% vision right eye.  Last mammogram was last year. Pap at that time as well.   Taking lipitor 40mg  but does get severe chest  pain when she takes this.     Allergies  Allergen Reactions  . Penicillins Anaphylaxis  . Aspirin Tinitus  . Nystatin     Cough   Current Meds  Medication Sig  . ALPRAZolam (XANAX) 0.5 MG tablet Take 0.5-1 tablets (0.25-0.5 mg total) by mouth 2 (two) times daily as needed for anxiety.  Marland Kitchen atorvastatin (LIPITOR) 40 MG tablet Take 40 mg by mouth daily.  . pantoprazole (PROTONIX) 40 MG tablet Take 1 tablet (40 mg total) by mouth daily.  . Prucalopride Succinate (MOTEGRITY) 2 MG TABS Take 1 tablet by mouth daily.  . traZODone (DESYREL) 100 MG tablet Take 1 tablet (100 mg total) by mouth at bedtime as needed for sleep.  Marland Kitchen VITAMIN E PO Take 180 mg by mouth daily.    Review of Systems  Constitutional: Positive for fatigue. Negative for chills and fever.  Respiratory: Positive for shortness of breath (worse under stress). Negative for cough, chest tightness and wheezing.   Cardiovascular: Negative for chest pain, palpitations and leg swelling.  Psychiatric/Behavioral: Positive for sleep disturbance (better with current medication). The patient is nervous/anxious.        Tearful, anxious, stressed; see hpi    Objective:  BP (!) 140/103 Comment: all vitals taken by pt-jaf  Pulse 92   Temp 98.6 F (37 C)   Wt 122 lb (55.3 kg)   BMI 19.11 kg/m   Weight: 122 lb (55.3 kg)   BP Readings from Last 3 Encounters:  05/13/19 (!) 140/103  04/01/19 95/67  01/13/19 118/83   Wt Readings from Last 3 Encounters:  05/13/19 122 lb (55.3 kg)  04/22/19 122 lb (55.3 kg)  04/01/19 122 lb 3.2 oz (55.4 kg)    EXAM:  GENERAL: alert, oriented, appears well and in no acute distress  HEENT: atraumatic, conjunctiva clear, no obvious abnormalities on inspection of external nose and ears  NECK: normal movements of the head and neck  LUNGS: on inspection no signs of respiratory distress, breathing rate appears normal, no obvious gross SOB, gasping or wheezing  CV: no obvious cyanosis  MS: moves  all visible extremities without noticeable abnormality  PSYCH/NEURO: She is tearful, anxious on call. She cries through most of call. She is extremely worried about son. Feels helpless in that situation. No thoughts of self harm, but also feels she cannot live feeling like this so wants to do what she can to work on feeling better. Husband is good support system for her. She is focusing on work during day. Feels she is very productive at work; it's just not enough. And when she does do a good job at work; she ends up getting additional work to do because of this.   Assessment/Plan  1. Anxiety Anxiety is getting worse.  Much of this is situational due to son with addiction issue and COVID crisis causing workload to be increased.  She is willing to try additional treatments to help with anxiety.  We discussed getting blood work done first.  Although she feels like most of her symptoms are anxiety and stress related, because she is having some elevated heart rate I do think it is reasonable to evaluate her in the office with an EKG and also to repeat a blood pressure (at last visit she was hypotensive secondary to excessive bowel movements, but now she is hypertensive).  She has not tolerated amitriptyline in the past for mood.  We briefly discussed consideration for Effexor since this may also help with hot flashes and menopausal type symptoms.  I also mentioned propranolol to help with heart rate as well as anxiety, especially if blood pressure remains slightly elevated.  She questioned using just hormone replacement to help with anxiety, but I do not think that this will be enough since she has been on anxiety treatments in the past and there are so many external factors creating increased stress.  I also suggested therapy, which she is willing to consider.  Number given to patient.  We discussed limitations of dealing with family member that has addiction issues.  I do think that it would be very helpful for  her to have a therapist to help sort through these feelings and to help with stresses that revolve around having a child with an addiction.  2. Fatigue, unspecified type Blood work was ordered at her last visit.  Follow-up is being scheduled and we will review blood work at that time.    I discussed the assessment and treatment plan with the patient. The patient was provided an opportunity to ask questions and all were answered. The patient agreed with the plan and demonstrated an understanding of the instructions.   The patient was advised to call back or seek an in-person evaluation if the symptoms worsen or if the condition fails to improve as anticipated.  I provided 30 minutes of non-face-to-face time during this encounter.   Theodis ShoveJunell Tyronn Golda, MD

## 2019-05-13 NOTE — Telephone Encounter (Signed)
I left a detailed message for the pt to call the office to schedule a lab appt. Office visit with PCP needed for EKG and BP check.

## 2019-05-13 NOTE — Telephone Encounter (Signed)
-----   Message from Caren Macadam, MD sent at 05/13/2019  1:59 PM EDT ----- Schedule labwork; is there any way we could get EKG and bp check as well?

## 2019-05-20 ENCOUNTER — Other Ambulatory Visit (INDEPENDENT_AMBULATORY_CARE_PROVIDER_SITE_OTHER): Payer: BC Managed Care – PPO

## 2019-05-20 ENCOUNTER — Ambulatory Visit (INDEPENDENT_AMBULATORY_CARE_PROVIDER_SITE_OTHER): Payer: BC Managed Care – PPO

## 2019-05-20 ENCOUNTER — Encounter: Payer: Self-pay | Admitting: Family Medicine

## 2019-05-20 ENCOUNTER — Ambulatory Visit (INDEPENDENT_AMBULATORY_CARE_PROVIDER_SITE_OTHER): Payer: BC Managed Care – PPO | Admitting: Family Medicine

## 2019-05-20 ENCOUNTER — Ambulatory Visit: Payer: BC Managed Care – PPO | Admitting: Family Medicine

## 2019-05-20 ENCOUNTER — Other Ambulatory Visit: Payer: Self-pay

## 2019-05-20 VITALS — BP 100/60 | HR 73 | Temp 97.6°F | Ht 67.0 in | Wt 123.7 lb

## 2019-05-20 DIAGNOSIS — R5383 Other fatigue: Secondary | ICD-10-CM

## 2019-05-20 DIAGNOSIS — R0602 Shortness of breath: Secondary | ICD-10-CM | POA: Diagnosis not present

## 2019-05-20 DIAGNOSIS — Z13220 Encounter for screening for lipoid disorders: Secondary | ICD-10-CM

## 2019-05-20 DIAGNOSIS — Z1239 Encounter for other screening for malignant neoplasm of breast: Secondary | ICD-10-CM | POA: Diagnosis not present

## 2019-05-20 DIAGNOSIS — E785 Hyperlipidemia, unspecified: Secondary | ICD-10-CM

## 2019-05-20 DIAGNOSIS — Z131 Encounter for screening for diabetes mellitus: Secondary | ICD-10-CM | POA: Diagnosis not present

## 2019-05-20 DIAGNOSIS — R252 Cramp and spasm: Secondary | ICD-10-CM | POA: Diagnosis not present

## 2019-05-20 DIAGNOSIS — R000 Tachycardia, unspecified: Secondary | ICD-10-CM | POA: Diagnosis not present

## 2019-05-20 DIAGNOSIS — R6889 Other general symptoms and signs: Secondary | ICD-10-CM

## 2019-05-20 LAB — CBC WITH DIFFERENTIAL/PLATELET
Basophils Absolute: 0.1 10*3/uL (ref 0.0–0.1)
Basophils Relative: 0.7 % (ref 0.0–3.0)
Eosinophils Absolute: 0 10*3/uL (ref 0.0–0.7)
Eosinophils Relative: 0.1 % (ref 0.0–5.0)
HCT: 42.1 % (ref 36.0–46.0)
Hemoglobin: 14.3 g/dL (ref 12.0–15.0)
Lymphocytes Relative: 30.4 % (ref 12.0–46.0)
Lymphs Abs: 3.1 10*3/uL (ref 0.7–4.0)
MCHC: 34 g/dL (ref 30.0–36.0)
MCV: 94.2 fl (ref 78.0–100.0)
Monocytes Absolute: 0.5 10*3/uL (ref 0.1–1.0)
Monocytes Relative: 4.8 % (ref 3.0–12.0)
Neutro Abs: 6.6 10*3/uL (ref 1.4–7.7)
Neutrophils Relative %: 64 % (ref 43.0–77.0)
Platelets: 217 10*3/uL (ref 150.0–400.0)
RBC: 4.46 Mil/uL (ref 3.87–5.11)
RDW: 13.5 % (ref 11.5–15.5)
WBC: 10.3 10*3/uL (ref 4.0–10.5)

## 2019-05-20 LAB — COMPREHENSIVE METABOLIC PANEL
ALT: 9 U/L (ref 0–35)
AST: 14 U/L (ref 0–37)
Albumin: 4.7 g/dL (ref 3.5–5.2)
Alkaline Phosphatase: 72 U/L (ref 39–117)
BUN: 11 mg/dL (ref 6–23)
CO2: 27 mEq/L (ref 19–32)
Calcium: 10 mg/dL (ref 8.4–10.5)
Chloride: 106 mEq/L (ref 96–112)
Creatinine, Ser: 0.75 mg/dL (ref 0.40–1.20)
GFR: 79.58 mL/min (ref >60.00)
Glucose, Bld: 82 mg/dL (ref 70–99)
Potassium: 4 mEq/L (ref 3.5–5.1)
Sodium: 141 mEq/L (ref 135–145)
Total Bilirubin: 0.4 mg/dL (ref 0.2–1.2)
Total Protein: 6.7 g/dL (ref 6.0–8.3)

## 2019-05-20 LAB — TSH: TSH: 0.71 u[IU]/mL (ref 0.35–4.50)

## 2019-05-20 LAB — VITAMIN B12: Vitamin B-12: 544 pg/mL (ref 211–911)

## 2019-05-20 LAB — LIPID PANEL
Cholesterol: 271 mg/dL — ABNORMAL HIGH (ref 0–200)
HDL: 58.7 mg/dL (ref >39.00)
LDL Cholesterol: 198 mg/dL — ABNORMAL HIGH (ref 0–99)
NonHDL: 211.84
Total CHOL/HDL Ratio: 5
Triglycerides: 68 mg/dL (ref 0.0–149.0)
VLDL: 13.6 mg/dL (ref 0.0–40.0)

## 2019-05-20 LAB — VITAMIN D 25 HYDROXY (VIT D DEFICIENCY, FRACTURES): VITD: 25.59 ng/mL — ABNORMAL LOW (ref 30.00–100.00)

## 2019-05-20 LAB — T3, FREE: T3, Free: 2.9 pg/mL (ref 2.3–4.2)

## 2019-05-20 LAB — HEMOGLOBIN A1C: Hgb A1c MFr Bld: 5.6 % (ref 4.6–6.5)

## 2019-05-20 LAB — MAGNESIUM: Magnesium: 2.1 mg/dL (ref 1.5–2.5)

## 2019-05-20 LAB — T4, FREE: Free T4: 0.88 ng/dL (ref 0.60–1.60)

## 2019-05-20 NOTE — Progress Notes (Signed)
Dana DoffingMichelle Iliene Harvey DOB: [birth year] Encounter date: 05/20/2019  This is a 58 y.o. female who presents with Chief Complaint  Patient presents with  . Shortness of Breath    History of present illness: Provider had patient set up in office exam due to elevated bp and heart rate. Bloodwork was also ordered but has not yet been completed.  She has come around with dealing with son with heroin addiction. She feels better after learning that multiple people were giving him money which helped her to feel like it was no longer felt that he was in his current situation.  She now has been able to let some of this go to God which is helped her.  Hasn't taken medication from Dr. Orvan FalconerBeavers this week. Prioritizing at work. Learning to say no. Does still have orthostatic moments still sometimes - notes that she feels it with changes in position sometime and then has to sit down. Today's reading is closer to her 'normal" at home. bp has been good this week. Has been taking half of alprazolam around 4pmp by time she gets home she feels better. Does note elevated heart rate several times/day. Notes more around 3pm. Seems like she starts to note it after lunch. No caffeine at lunch.   Wanted to ask about med that patient told her about. Calms Forte. Thought she could try this for sleep.   Is waking up with charlie horses frequently. This is newer for her.  Not taking the lipitor because it causes her chest pains. Stopped then restarted and chest pain recurred. Tried crestor in past - leg cramps. Lipitor brand and generic were also bad. Has been off of this now for 2 weeks.   Sometimes feels that breathing is labored. Sometimes feels winded/hard to take deep breath.   Hasn't followed with cardiology due to cost for multiple pre-existing medical bills.    Allergies  Allergen Reactions  . Penicillins Anaphylaxis  . Aspirin Tinitus  . Crestor [Rosuvastatin Calcium]   . Lipitor [Atorvastatin Calcium]   .  Nystatin     Cough   Current Meds  Medication Sig  . ALPRAZolam (XANAX) 0.5 MG tablet Take 0.5-1 tablets (0.25-0.5 mg total) by mouth 2 (two) times daily as needed for anxiety.  . pantoprazole (PROTONIX) 40 MG tablet Take 1 tablet (40 mg total) by mouth daily.  . Prucalopride Succinate (MOTEGRITY) 2 MG TABS Take 1 tablet by mouth daily.  . traZODone (DESYREL) 100 MG tablet Take 1 tablet (100 mg total) by mouth at bedtime as needed for sleep.  Marland Kitchen. VITAMIN E PO Take 180 mg by mouth daily.  . [DISCONTINUED] atorvastatin (LIPITOR) 40 MG tablet Take 40 mg by mouth daily.    Review of Systems  Constitutional: Negative for chills, fatigue and fever.  Respiratory: Negative for cough, chest tightness, shortness of breath and wheezing.   Cardiovascular: Negative for chest pain, palpitations and leg swelling.    Objective:  BP 100/60 (BP Location: Left Arm, Patient Position: Sitting, Cuff Size: Normal)   Pulse 73   Temp 97.6 F (36.4 C) (Temporal)   Ht 5\' 7"  (1.702 m)   Wt 123 lb 11.2 oz (56.1 kg)   SpO2 98%   BMI 19.37 kg/m   Weight: 123 lb 11.2 oz (56.1 kg)   BP Readings from Last 3 Encounters:  05/20/19 100/60  05/13/19 (!) 140/103  04/01/19 95/67   Wt Readings from Last 3 Encounters:  05/20/19 123 lb 11.2 oz (56.1 kg)  05/13/19 122  lb (55.3 kg)  04/22/19 122 lb (55.3 kg)    Physical Exam Constitutional:      General: She is not in acute distress.    Appearance: She is well-developed.  Cardiovascular:     Rate and Rhythm: Normal rate and regular rhythm.     Heart sounds: Normal heart sounds. No murmur. No friction rub.  Pulmonary:     Effort: Pulmonary effort is normal. No respiratory distress.     Breath sounds: Normal breath sounds. No wheezing or rales.  Musculoskeletal:     Right lower leg: No edema.     Left lower leg: No edema.  Neurological:     Mental Status: She is alert and oriented to person, place, and time.  Psychiatric:        Mood and Affect: Mood  normal.        Behavior: Behavior normal.     Assessment/Plan:   1. Fatigue, unspecified type This is been going on for some time.  We will start with some blood work and then consider further evaluation pending these results.  She has also had significantly increased stress recently, which may be contributing as well. - EKG 12-Lead - Vitamin B12 - VITAMIN D 25 Hydroxy (Vit-D Deficiency, Fractures) - T3, free - T4, free - TSH - Comprehensive metabolic panel - CBC with Differential/Platelet - Magnesium  2. Tachycardia Tachycardia was not apparent on EKG today.  EKG was in normal sinus rhythm.  No acute changes.  Now asked to elevation or depression. - EKG 12-Lead  3. Screening for breast cancer She is due for repeat mammogram. - MM DIGITAL SCREENING BILATERAL; Future  4. Leg cramping This is near complete.  We will start with some blood work.  I have added on magnesium to previous blood work ordered.  5. Shortness of breath This is a newer complaint for her.  We will get a chest x-ray since she is here today.  Lung exam sounded normal.  Further evaluation pending this result. - DG Chest 2 View; Future - DG Chest 2 View  6. Cold intolerance  - TSH - Magnesium  7. Lipid screening  - Lipid panel  8. Screening for diabetes mellitus  - Hemoglobin A1c   Return pending bloodwork results.    Micheline Rough, MD

## 2019-05-20 NOTE — Patient Instructions (Signed)
I will call you with lab results when I get them and we can plan follow up visit at that time.

## 2019-05-25 ENCOUNTER — Encounter: Payer: Self-pay | Admitting: Family Medicine

## 2019-05-25 NOTE — Addendum Note (Signed)
Addended by: Agnes Lawrence on: 05/25/2019 03:37 PM   Modules accepted: Orders

## 2019-05-27 ENCOUNTER — Ambulatory Visit: Payer: BC Managed Care – PPO | Admitting: Gastroenterology

## 2019-05-27 ENCOUNTER — Encounter: Payer: BC Managed Care – PPO | Admitting: Internal Medicine

## 2019-06-02 ENCOUNTER — Ambulatory Visit (INDEPENDENT_AMBULATORY_CARE_PROVIDER_SITE_OTHER): Payer: BC Managed Care – PPO | Admitting: Physician Assistant

## 2019-06-02 ENCOUNTER — Encounter: Payer: Self-pay | Admitting: Physician Assistant

## 2019-06-02 ENCOUNTER — Telehealth: Payer: BC Managed Care – PPO | Admitting: Internal Medicine

## 2019-06-02 ENCOUNTER — Ambulatory Visit: Payer: BC Managed Care – PPO | Admitting: Physician Assistant

## 2019-06-02 ENCOUNTER — Other Ambulatory Visit: Payer: Self-pay

## 2019-06-02 VITALS — BP 98/70 | HR 69 | Temp 97.0°F | Ht 67.0 in | Wt 123.6 lb

## 2019-06-02 DIAGNOSIS — R002 Palpitations: Secondary | ICD-10-CM

## 2019-06-02 DIAGNOSIS — R079 Chest pain, unspecified: Secondary | ICD-10-CM

## 2019-06-02 DIAGNOSIS — R072 Precordial pain: Secondary | ICD-10-CM | POA: Diagnosis not present

## 2019-06-02 DIAGNOSIS — I495 Sick sinus syndrome: Secondary | ICD-10-CM | POA: Diagnosis not present

## 2019-06-02 DIAGNOSIS — Z950 Presence of cardiac pacemaker: Secondary | ICD-10-CM

## 2019-06-02 DIAGNOSIS — R0789 Other chest pain: Secondary | ICD-10-CM

## 2019-06-02 NOTE — Progress Notes (Addendum)
Cardiology Office Note    Date:  06/02/2019   ID:  Dana Harvey, DOB Jul 24, 1961, MRN [id number]  PCP:  Dana BankerKoberlein, Junell C, MD  Cardiologist:  Plan to set up with Dr. Jens Harvey as primary cardiologist and Dr. Graciela Harvey as EP  Chief Complaint  Patient presents with   Establish Care   Consult    cardiology evaluation for chest pain and palpitation    History of Present Illness:  Dana Harvey is a 58 y.o. female with past medical history of CVA, depression, GERD, insomnia, history of seizure in the setting of severe hypokalemia, and history of pacemaker.  Her father had first MI at age 57 and ended up having bypass surgery later.  Maternal grandmother had a sudden cardiac death at age 557.  Patient was initially seen by Dr. Annice Harvey electrophysiology in [place] for inappropriate tachycardia, she later had an ablation however was unsuccessful.  Loop recorder later confirmed sick sinus syndrome with nighttime bradycardia down to the 20s. She had a cardiac cath in [facility] in [place] which was reportedly normal. She later established seen by Dr. Sherre Scarletavid [facility] [place] who diagnosed her with sick sinus syndrome, a St Jude dual-chamber pacemaker placed.  She also had a prior history of atrial fibrillation according to the patient.  No previous record was available to me.  Patient was later moved [facility] and was followed by Dr. Fransisca ConnorsMichael Harvey.  Last interrogation was near the end of 2019.  [place] moved to [place] in February 2020 and has not established with a cardiologist since then. She works as a Clinical biochemistCMA. In the past few weeks, she has been noticing some quivering sensation in the heart and also occasional chest pain.  The two symptoms do not occur at the same time.  The chest pain typically occurs at rest and last about 5 seconds.  There is no obvious correlation between chest pain and physical exertion.  There has been increasing in  frequency lately with up to 15 and 16 episodes per day.  She does notice increasing dyspnea with exertion and fatigue recently.  Recent lab work showed normal red blood cell count and renal function.  Fasting lipid panel showed LDL quite elevated up to 198.  She was seen by her primary care provider and was referred to cardiology service for further evaluation.     Past Medical History:  Diagnosis Date   Anxiety    CVA (cerebral vascular accident) (HCC) 1997   Depression    Diverticulitis    Fainting spell    Frequent headaches    GERD (gastroesophageal reflux disease)    Heart murmur    Insomnia    Migraines    Pacemaker    Polyp of intestine 2018   benign per patient   Seizures (HCC)    secondary to potassium level dropping (due to opioid medications per patient)   Thyroid disease     Past Surgical History:  Procedure Laterality Date   ABDOMINAL HYSTERECTOMY  1993   heavy vaginal bleeding; later went in and removed Right ovary   APPENDECTOMY  1984   ATRIAL ABLATION SURGERY  2012   BREAST BIOPSY     bilateral; benign   CESAREAN SECTION     [phone],1984,1985   CHOLECYSTECTOMY  2010   PACEMAKER INSERTION  09/06/2014   Dr Dana Huaavid Harvey--per patient; put in after ablation    Current Medications: Outpatient Medications Prior to Visit  Medication Sig Dispense Refill   atorvastatin (LIPITOR) 40 MG tablet Take 1 tablet by mouth daily.     ALPRAZolam (XANAX) 0.5 MG tablet Take 0.5-1 tablets (0.25-0.5 mg total) by mouth 2 (two) times daily as needed for anxiety. 60 tablet 0   pantoprazole (PROTONIX) 40 MG tablet Take 1 tablet (40 mg total) by mouth daily. 90 tablet 1   traZODone (DESYREL) 100 MG tablet Take 1 tablet (100 mg total) by mouth at bedtime as needed for sleep. 30 tablet 2   VITAMIN E PO Take 180 mg by mouth daily.     Prucalopride Succinate (MOTEGRITY) 2 MG TABS Take 1 tablet by mouth daily. 30 tablet 3   No facility-administered  medications prior to visit.      Allergies:   Penicillins, Aspirin, Crestor [rosuvastatin calcium], Lipitor [atorvastatin calcium], and Nystatin   Social History   Socioeconomic History   Marital status: Married    Spouse name: Not on file   Number of children: Not on file   Years of education: Not on file   Highest education level: Not on file  Occupational History   Not on file  Social Needs   Financial resource strain: Not on file   Food insecurity    Worry: Not on file    Inability: Not on file   Transportation needs    Medical: Not on file    Non-medical: Not on file  Tobacco Use   Smoking status: Former Smoker   Smokeless tobacco: Never Used   Tobacco comment: quit 37 years ago  Substance and Sexual Activity   Alcohol use: Never    Frequency: Never   Drug use: Never   Sexual activity: Not on file  Lifestyle   Physical activity    Days per week: Not on file    Minutes per session: Not on file   Stress: Not on file  Relationships   Social connections    Talks on phone: Not on file    Gets together: Not on file    Attends religious service: Not on file    Active member of club or organization: Not on file    Attends meetings of clubs or organizations: Not on file    Relationship status: Not on file  Other Topics Concern   Not on file  Social History Narrative   Not on file     Family History:  The patient's family history includes Alcohol abuse in her brother, brother, maternal grandfather, mother, and son; Arthritis in her mother; COPD in her mother; Cancer in her brother, father, paternal grandfather, and paternal grandmother; Depression in her brother, brother, and mother; Diabetes in her brother and father; Drug abuse in her brother, brother, and son; Early death (age of onset: 8653) in her maternal grandmother; Hearing loss in her mother; Heart disease in her maternal grandmother, paternal grandfather, and paternal grandmother; Heart disease  (age of onset: 3162) in her father; Heart disease (age of onset: 4270) in her maternal grandfather; Hyperlipidemia in her brother and mother; Hypertension in her brother, father, and mother; Osteoarthritis in her mother.   ROS:   Please see the history of present illness.    ROS All other systems reviewed and are negative.   PHYSICAL EXAM:   VS:  BP 98/70    Pulse 69    Temp (!) 97 F (36.1 Harvey)    Ht 5\' 7"  (1.702 m)    Wt 123 lb 9.6 oz (56.1 kg)  SpO2 100%    BMI 19.36 kg/m    GEN: Well nourished, well developed, in no acute distress  HEENT: normal  Neck: no JVD, carotid bruits, or masses Cardiac: RRR; no murmurs, rubs, or gallops,no edema. Pacemaker in left pectoral space.  Respiratory:  clear to auscultation bilaterally, normal work of breathing GI: soft, nontender, nondistended, + BS MS: no deformity or atrophy  Skin: warm and dry, no rash Neuro:  Alert and Oriented x 3, Strength and sensation are intact Psych: euthymic mood, full affect  Wt Readings from Last 3 Encounters:  06/02/19 123 lb 9.6 oz (56.1 kg)  05/20/19 123 lb 11.2 oz (56.1 kg)  05/13/19 122 lb (55.3 kg)      Studies/Labs Reviewed:   EKG:  EKG is ordered today.  The ekg ordered today demonstrates occasionally atrial paced rhythm.  No significant ST-T wave changes  Recent Labs: 05/20/2019: ALT 9; BUN 11; Creatinine, Ser 0.75; Hemoglobin 14.3; Magnesium 2.1; Platelets 217.0; Potassium 4.0; Sodium 141; TSH 0.71   Lipid Panel    Component Value Date/Time   CHOL 271 (H) 05/20/2019 1124   TRIG 68.0 05/20/2019 1124   HDL 58.70 05/20/2019 1124   CHOLHDL 5 05/20/2019 1124   VLDL 13.6 05/20/2019 1124   LDLCALC 198 (H) 05/20/2019 1124    Additional studies/ records that were reviewed today include:   N/A   ASSESSMENT:    1. Precordial pain   2. Chest pain of uncertain etiology   3. Palpitations   4. SSS (sick sinus syndrome) ([place])   5. Pacemaker      PLAN:  In order of problems listed  above:  1. Chest pain: Chest pain seems to be atypical and only last about 5 to 10 seconds each time.  However she does have increasing exertional dyspnea and fatigue.  I recommended a echocardiogram and stress test in this case.  The case has been discussed with Dr. Stanford Breed who is agreeable.  She was established with Dr. Stanford Breed as primary cardiologist and Dr. Caryl Comes of his electrophysiologist.  Request previous pacemaker implantation report, office note, cath report and last stress test.  She reportedly had an normal cath in 2011 and a negative stress test sometime between 2015 and 2019.  2. Palpitation: Symptom of palpitation occurs at different time as the chest discomfort.  She will need earlier device interrogation to make sure she does not have any significant irregular rhythm.  Otherwise plan to follow-up with Dr. Caryl Comes as her electrophysiologist   3. SSS s/p pacemaker: Placed in September 06, 2014 by Dr. Bjorn Loser [place]  4. PAF: This is per patient report, I do not have any documentation to support this.  She says she had atrial fibrillation in the past however denies any recent recurrence.  She is not on any systemic anticoagulation therapy. CHA2DS2-Vasc score 1 (female)   Addendum: outside record received, see separate phone note on 06/14/2019 for more detail regarding previous history and workup. Will upload outside record to EPIC   Medication Adjustments/Labs and Tests Ordered: Current medicines are reviewed at length with the patient today.  Concerns regarding medicines are outlined above.  Medication changes, Labs and Tests ordered today are listed in the Patient Instructions below. Patient Instructions  Medication Instructions:  Your physician recommends that you continue on your current medications as directed. Please refer to the Current Medication list given to you today.  If you need a refill on your cardiac medications before your next appointment, please call  your pharmacy.   Lab work: NONE ordered at this time of appointment   If you have labs (blood work) drawn today and your tests are completely normal, you will receive your results only by:  MyChart Message (if you have MyChart) OR  A paper copy in the mail If you have any lab test that is abnormal or we need to change your treatment, we will call you to review the results.  Testing/Procedures: Your physician has requested that you have an Echocardiogram. Echocardiography is a painless test that uses sound waves to create images of your heart. It provides your doctor with information about the size and shape of your heart and how well your hearts chambers and valves are working. This procedure takes approximately one hour. There are no restrictions for this procedure.  Your physician has requested that you have a Lexiscan Myoview. For further information please visit [email or web address]. Please follow instruction sheet, as given.  Please schedule both Echocardiogram and Lexiscan as urgent next week  Follow-Up: At [facility], you and your health needs are our priority.  As part of our continuing mission to provide you with exceptional heart care, we have created designated Provider Care Teams.  These Care Teams include your primary Cardiologist (physician) and Advanced Practice Providers (APPs -  Physician Assistants and Nurse Practitioners) who all work together to provide you with the care you need, when you need it. You will need a follow up appointment in 6 weeks.  Please call our office 2 months in advance to schedule this appointment.  You may see Olga MillersBrian Crenshaw, MD or one of the following Advanced Practice Providers on your designated Care Team:   Corine ShelterLuke Kilroy, PA-Harvey Judy PimpleKrista Kroeger, PA-Harvey  Marjie Skiffallie Goodrich, [place]-Harvey  Any Other Special Instructions Will Be Listed Below (If Applicable).         Ramond DialSigned, Johnwilliam Shepperson, [place]  06/02/2019 6:22 PM    [facility] [address], [place], [place]  [address] Phone: [phone](336) [phone]; Fax: [phone]

## 2019-06-02 NOTE — Patient Instructions (Addendum)
Medication Instructions:  Your physician recommends that you continue on your current medications as directed. Please refer to the Current Medication list given to you today.  If you need a refill on your cardiac medications before your next appointment, please call your pharmacy.   Lab work: NONE ordered at this time of appointment   If you have labs (blood work) drawn today and your tests are completely normal, you will receive your results only by: Marland Kitchen MyChart Message (if you have MyChart) OR . A paper copy in the mail If you have any lab test that is abnormal or we need to change your treatment, we will call you to review the results.  Testing/Procedures: Your physician has requested that you have an Echocardiogram. Echocardiography is a painless test that uses sound waves to create images of your heart. It provides your doctor with information about the size and shape of your heart and how well your heart's chambers and valves are working. This procedure takes approximately one hour. There are no restrictions for this procedure.  Your physician has requested that you have a Lexiscan Myoview. For further information please visit [email or web address]. Please follow instruction sheet, as given.  Please schedule both Echocardiogram and Lexiscan as urgent next week  Follow-Up: At [facility], you and your health needs are our priority.  As part of our continuing mission to provide you with exceptional heart care, we have created designated Provider Care Teams.  These Care Teams include your primary Cardiologist (physician) and Advanced Practice Providers (APPs -  Physician Assistants and Nurse Practitioners) who all work together to provide you with the care you need, when you need it. You will need a follow up appointment in 6 weeks.  Please call our office 2 months in advance to schedule this appointment.  You may see Kirk Ruths, MD or one of the following Advanced Practice Providers on  your designated Care Team:   Kerin Ransom, PA-C Roby Lofts, [place] . Sande Rives, PA-C  Any Other Special Instructions Will Be Listed Below (If Applicable).

## 2019-06-07 ENCOUNTER — Telehealth (HOSPITAL_COMMUNITY): Payer: Self-pay | Admitting: *Deleted

## 2019-06-07 NOTE — Telephone Encounter (Signed)
Patient given detailed instructions per Myocardial Perfusion Study Information Sheet for the test on 06/10/19 at 9:45. Patient notified to arrive 15 minutes early and that it is imperative to arrive on time for appointment to keep from having the test rescheduled.  If you need to cancel or reschedule your appointment, please call the office within 24 hours of your appointment. . Patient verbalized understanding.Dana Harvey

## 2019-06-10 ENCOUNTER — Ambulatory Visit (HOSPITAL_BASED_OUTPATIENT_CLINIC_OR_DEPARTMENT_OTHER): Payer: BC Managed Care – PPO

## 2019-06-10 ENCOUNTER — Ambulatory Visit (HOSPITAL_COMMUNITY): Payer: BC Managed Care – PPO | Attending: Physician Assistant

## 2019-06-10 ENCOUNTER — Other Ambulatory Visit: Payer: Self-pay

## 2019-06-10 DIAGNOSIS — R110 Nausea: Secondary | ICD-10-CM | POA: Diagnosis not present

## 2019-06-10 DIAGNOSIS — R072 Precordial pain: Secondary | ICD-10-CM

## 2019-06-10 LAB — MYOCARDIAL PERFUSION IMAGING
LV dias vol: 54 mL (ref 46–106)
LV sys vol: 22 mL
Peak HR: 79 {beats}/min
Rest HR: 71 {beats}/min
SDS: 6
SRS: 0
SSS: 6
TID: 1.01

## 2019-06-10 MED ORDER — AMINOPHYLLINE 25 MG/ML IV SOLN
75.0000 mg | Freq: Once | INTRAVENOUS | Status: AC
  Administered 2019-06-10: 75 mg via INTRAVENOUS

## 2019-06-10 MED ORDER — REGADENOSON 0.4 MG/5ML IV SOLN
0.4000 mg | Freq: Once | INTRAVENOUS | Status: AC
  Administered 2019-06-10: 0.4 mg via INTRAVENOUS

## 2019-06-10 MED ORDER — TECHNETIUM TC 99M TETROFOSMIN IV KIT
32.5000 | PACK | Freq: Once | INTRAVENOUS | Status: AC | PRN
  Administered 2019-06-10: 32.5 via INTRAVENOUS
  Filled 2019-06-10: qty 33

## 2019-06-10 MED ORDER — TECHNETIUM TC 99M TETROFOSMIN IV KIT
10.9000 | PACK | Freq: Once | INTRAVENOUS | Status: AC | PRN
  Administered 2019-06-10: 10.9 via INTRAVENOUS
  Filled 2019-06-10: qty 11

## 2019-06-14 ENCOUNTER — Encounter: Payer: Self-pay | Admitting: Physician Assistant

## 2019-06-14 ENCOUNTER — Telehealth: Payer: Self-pay | Admitting: Cardiology

## 2019-06-14 ENCOUNTER — Telehealth: Payer: Self-pay | Admitting: Physician Assistant

## 2019-06-14 NOTE — Telephone Encounter (Signed)
I have called and discussed the patient's echocardiogram result and a stress test result with her.  I reassured her that when compared to her previous echocardiogram and stress test in 2017, her overall ejection fraction has slightly improved and that there is no evidence of significant reversible blockage to explain her symptoms.  She says she continued to feel shortness of breath with exertion.  She feels like there is something going on with her heart.  Although the recent cardiac test has placed significant strain on her financial status. I encouraged her to followup with Dr. Caryl Comes, she does not need followup with Dr. Stanford Breed at this time as I doubt structural heart issue is responsible for her symptom

## 2019-06-14 NOTE — Telephone Encounter (Signed)
New Message     Pt Is calling for results   Please call

## 2019-06-14 NOTE — Telephone Encounter (Signed)
Which phone number did she use, I just called her, no answer

## 2019-06-14 NOTE — Progress Notes (Signed)
Normal stress test, no significant reversible blockage

## 2019-06-14 NOTE — Progress Notes (Signed)
Normal echo with normal pumping function of heart. When compare to previous echo in 2017, pumping function has improved from 45-50% to now 50-55%.

## 2019-06-14 NOTE — Telephone Encounter (Signed)
I have reviewed received outside medical record, previous work-up as summarized below:  Records received from Dr. Rance Muir [facility]ovascular specialist of [place] [address] [place] [address]  1.  Coronary CT obtained at [facility] on 05/15/2017, normal coronary arteries, EF 57%.  No evidence of atherosclerosis.  Chest pain unlikely to be cardiac in origin.  2.  St Jude pacemaker interrogation 05/08/2017, dual-chamber interrogation by Dr. Radford Pax.  Ventricular pacing less than 1%, atrial pacing 72%, no therapy delivered.  Documented note at the time says patient complained of palpitation on almost daily.  Some symptom correlated with A and V auto capture.  A and V auto capture was turned off.  Adjusted EGM collection for AMS and a decreased AMS detection rate to 160 BPM.  3.  Previous echocardiogram obtained in July 2017 showed EF 45 to 50%, no pulmonary hypertension noted, normal valve function.  4.  History of pulmonary function test revealed a normal spirometry, normal lung volumes, severe diffusion defect with DLCO 43% predicted.   5.  Although patient says she has a history of PAF, previous cardiology notes from Dr. Radford Pax did not document any evidence of atrial fibrillation.  6.  Previous sleep study obtained in August 2017.  No evidence of nocturnal hypoxia.  7.  Myoview obtained on 05/15/2016 was normal with fixed small anterior defect mixed with ischemia however this was felt to be breast attenuation artifact, normal LVEF    Records received from Dr. Jacqlyn Larsen office 1. Vass pacemaker ([place] [id number] pacemaker [id number]) placed by Dr. Bjorn Loser [phone]) on 09/06/2014 for sinus node dysfunction  2.  Dr. Nonnie Done office did document prior history of atrial fibrillation, last documentation was in December 2015, no evidence of recurrent atrial fibrillation on pacemaker.  3.  Previous cardiac catheterization in  2012 that showed no coronary artery disease.  She also had a tilt table testing that showed no significant change in BP or heart rate.  She also had a witnessed syncopal episode in Dr. Starleen Blue Panday's office with normal BP and heart rate during the event.  She ultimately underwent EPS.  Dr. Karleen Hampshire reported no inducible sustained tachyarrhythmia.  Dr. Karleen Hampshire then proceed with ablation for clinically suspected inappropriate sinus tachycardia despite top of the report read no finding of supraventricular tachycardia.  She had sinus tachycardia with heart rate of 130-140s on isoproterenol and epinephrine which was not surprising.  She subsequently developed sinus bradycardia.  She had a implantable loop recorder placed in early 2013.  Based on last report by Dr. Magdalene Molly, she was noted to have significant sinus bradycardia during sleep but occasional heart rate down into the 30s and 40s during the day as well on previous loop recorder interrogation.  This apparently led to her pacemaker implantation.

## 2019-06-14 NOTE — Telephone Encounter (Signed)
The patient was calling for her Echo and Lexiscan results. Message routed to the ordering provider.

## 2019-06-14 NOTE — Progress Notes (Signed)
When compare to previous stress test in 2017 at outside facility, no significant change

## 2019-06-20 ENCOUNTER — Other Ambulatory Visit: Payer: Self-pay

## 2019-06-20 ENCOUNTER — Telehealth: Payer: Self-pay | Admitting: *Deleted

## 2019-06-20 ENCOUNTER — Ambulatory Visit (INDEPENDENT_AMBULATORY_CARE_PROVIDER_SITE_OTHER): Payer: BC Managed Care – PPO | Admitting: Internal Medicine

## 2019-06-20 ENCOUNTER — Encounter: Payer: Self-pay | Admitting: Internal Medicine

## 2019-06-20 VITALS — BP 106/74 | HR 72 | Ht 67.0 in | Wt 125.2 lb

## 2019-06-20 DIAGNOSIS — Z950 Presence of cardiac pacemaker: Secondary | ICD-10-CM

## 2019-06-20 DIAGNOSIS — I495 Sick sinus syndrome: Secondary | ICD-10-CM | POA: Diagnosis not present

## 2019-06-20 DIAGNOSIS — R072 Precordial pain: Secondary | ICD-10-CM | POA: Diagnosis not present

## 2019-06-20 DIAGNOSIS — R002 Palpitations: Secondary | ICD-10-CM

## 2019-06-20 NOTE — Progress Notes (Addendum)
ELECTROPHYSIOLOGY CONSULT NOTE  Patient ID: Dana Harvey, MRN: [id number], DOB/AGE: October 30, 1960 58 y.o. Admit date: (Not on file) Date of Consult: 06/20/2019  Primary Physician: Caren Macadam, MD Primary Cardiologist: new      Dana Harvey is a 58 y.o. female who is being seen today for the evaluation of pacemaker and tachycardia  at the request of BC.    HPI Dana Harvey is a 58 y.o. female seen to establish pacemaker St Jude 2015   Symptoms of fatigue and Dx with chronic fatigue and fibromyalgia and saw many MDs finally Dx with inappropriate sinus tachycardia and underwent ablation with significant improvement   Then had recurrent symptomts and loop showed >> tachy and "nocturnal bradycardia" and paced with resolution of almost all of her symptoms.  She understood it was to "fix her Afib"  Over the last 4 months has had exertional dyspnea, LH and shower intolerance  Longstanding heat intolerance    DATE TEST EF   8/20 .myoview  59 % No ischemia  8/20 Echo  50-55%            Past Medical History:  Diagnosis Date  . Anxiety   . Chest pain at rest    normal cors on cath in 2012. Normal coronary CT in 2018. Normal stress test in 05/2019  . CVA (cerebral vascular accident) ([place]) 1997  . Depression   . Diverticulitis   . Dyspnea    previous PFT showed normal lung volumes, severe diffusion defect with DLCO 43% predicted  . Fainting spell   . Frequent headaches   . GERD (gastroesophageal reflux disease)   . Heart murmur   . Insomnia   . Migraines   . Pacemaker    St Jude dual chamber PPM placed on 09/06/2014 by Dr. Magdalene Molly in [place]  . PAF (paroxysmal atrial fibrillation) ([place])    diagnosed by Dr. Magdalene Molly, remote history, no recurrent  . Polyp of intestine 2018   benign per patient  . Seizures ([place])    secondary to potassium level dropping (due to opioid medications per patient)  . Thyroid disease       Surgical  History:  Past Surgical History:  Procedure Laterality Date  . ABDOMINAL HYSTERECTOMY  1993   heavy vaginal bleeding; later went in and removed Right ovary  . APPENDECTOMY  1984  . ATRIAL ABLATION SURGERY  2012  . BREAST BIOPSY     bilateral; benign  . CARDIAC CATHETERIZATION  2012   normal cors on cath in [facility] [place]  . CESAREAN SECTION     [phone]  . CHOLECYSTECTOMY  2010  . ELECTROPHYSIOLOGY STUDY  around 2013   with ablation for clinically suspected inappropriate sinus tachycardia, by Dr. Karleen Hampshire  . LOOP RECORDER IMPLANT  2013   by Dr. Karleen Hampshire  . PACEMAKER INSERTION  09/06/2014   St Jude Assurity dual chamber PPM, Dr Shanon Brow [place]--per patient; put in after ablation     Home Meds: Current Meds  Medication Sig  . ALPRAZolam (XANAX) 0.5 MG tablet Take 0.5-1 tablets (0.25-0.5 mg total) by mouth 2 (two) times daily as needed for anxiety.  Marland Kitchen atorvastatin (LIPITOR) 40 MG tablet Take 80 mg by mouth daily.  . pantoprazole (PROTONIX) 40 MG tablet Take 40 mg by mouth daily as needed (GERD).  Marland Kitchen traZODone (DESYREL) 100 MG tablet Take 100 mg by mouth at bedtime.  Marland Kitchen zolpidem (AMBIEN CR) 6.25 MG CR tablet Take 1 tablet  by mouth at bedtime.    Allergies:  Allergies  Allergen Reactions  . Penicillins Anaphylaxis  . Aspirin Tinitus  . Crestor [Rosuvastatin Calcium]   . Nystatin     Cough    Social History   Socioeconomic History  . Marital status: Married    Spouse name: Not on file  . Number of children: Not on file  . Years of education: Not on file  . Highest education level: Not on file  Occupational History  . Not on file  Social Needs  . Financial resource strain: Not on file  . Food insecurity    Worry: Not on file    Inability: Not on file  . Transportation needs    Medical: Not on file    Non-medical: Not on file  Tobacco Use  . Smoking status: Former Games developermoker  . Smokeless tobacco: Never Used  . Tobacco comment: quit 37 years ago  Substance and  Sexual Activity  . Alcohol use: Never    Frequency: Never  . Drug use: Never  . Sexual activity: Not on file  Lifestyle  . Physical activity    Days per week: Not on file    Minutes per session: Not on file  . Stress: Not on file  Relationships  . Social Musicianconnections    Talks on phone: Not on file    Gets together: Not on file    Attends religious service: Not on file    Active member of club or organization: Not on file    Attends meetings of clubs or organizations: Not on file    Relationship status: Not on file  . Intimate partner violence    Fear of current or ex partner: Not on file    Emotionally abused: Not on file    Physically abused: Not on file    Forced sexual activity: Not on file  Other Topics Concern  . Not on file  Social History Narrative  . Not on file     Family History  Problem Relation Age of Onset  . Osteoarthritis Mother   . Alcohol abuse Mother   . Arthritis Mother   . COPD Mother   . Depression Mother   . Hearing loss Mother   . Hyperlipidemia Mother   . Hypertension Mother   . Cancer Father   . Diabetes Father   . Heart disease Father [phone]       CABG  . Hypertension Father   . Alcohol abuse Brother   . Cancer Brother   . Depression Brother   . Drug abuse Brother   . Alcohol abuse Son   . Drug abuse Son   . Early death Maternal Grandmother [phone]  . Heart disease Maternal Grandmother   . Alcohol abuse Maternal Grandfather   . Heart disease Maternal Grandfather 70  . Cancer Paternal Grandmother   . Heart disease Paternal Grandmother   . Cancer Paternal Grandfather   . Heart disease Paternal Grandfather   . Alcohol abuse Brother   . Depression Brother   . Diabetes Brother   . Drug abuse Brother   . Hypertension Brother   . Hyperlipidemia Brother      ROS:  Please see the history of present illness.     All other systems reviewed and negative.    Physical Exam: Blood pressure 106/74, pulse 72, height 5\' 7"  (1.702 m), weight 125 lb  3.2 oz (56.8 kg), SpO2 97 %. General: Well developed, well nourished female in no  acute distress. Head: Normocephalic, atraumatic, sclera non-icteric, no xanthomas, nares are without discharge. EENT: normal  Lymph Nodes:  none Neck: Negative for carotid bruits. JVD not elevated. Back:without scoliosis kyphosis Lungs: Clear bilaterally to auscultation without wheezes, rales, or rhonchi. Breathing is unlabored. Device pocket well healed; without hematoma or erythema.  There is no tethering  Heart: RRR with S1 S2. No  murmur . No rubs, or gallops appreciated. Abdomen: Soft, non-tender, non-distended with normoactive bowel sounds. No hepatomegaly. No rebound/guarding. No obvious abdominal masses. Msk:  Strength and tone appear normal for age. Extremities: No clubbing or cyanosis. No  edema.  Distal pedal pulses are 2+ and equal bilaterally. Skin: Warm and Dry Neuro: Alert and oriented X 3. CN III-XII intact Grossly normal sensory and motor function . Psych:  Responds to questions appropriately with a normal affect.      Labs: Cardiac Enzymes No results for input(s): CKTOTAL, CKMB, TROPONINI in the last 72 hours. CBC Lab Results  Component Value Date   WBC 10.3 05/20/2019   HGB 14.3 05/20/2019   HCT 42.1 05/20/2019   MCV 94.2 05/20/2019   PLT 217.0 05/20/2019   PROTIME: No results for input(s): LABPROT, INR in the last 72 hours. Chemistry No results for input(s): NA, K, CL, CO2, BUN, CREATININE, CALCIUM, PROT, BILITOT, ALKPHOS, ALT, AST, GLUCOSE in the last 168 hours.  Invalid input(s): LABALBU Lipids Lab Results  Component Value Date   CHOL 271 (H) 05/20/2019   HDL 58.70 05/20/2019   LDLCALC 198 (H) 05/20/2019   TRIG 68.0 05/20/2019   BNP No results found for: PROBNP Thyroid Function Tests: No results for input(s): TSH, T4TOTAL, T3FREE, THYROIDAB in the last 72 hours.  Invalid input(s): FREET3 Miscellaneous No results found for: DDIMER  Radiology/Studies:  No results  found.  EKG:  7/20 Personally reviewed  Sinus w intermittent A pacing  8/20 [facility]  20/08/39 Assessment and Plan:   Inappropriate sinus tachy s/p ablation  Sinus node dysfunction secondary  Pacemaker St Jude   orthostatic intolerance  Dyspnea on exertion   Profound orthostatic hypotension which is likely contributing to her dyspnea and exercise intolerance.  Would anticipate trying compressive wear and hydration with tri-oral/pedialyte/liquid IV   Device function is normal with some degree of heart rate limitation-- may be a role for device reprogramming  Will followup by way of telehealth visit.       Sherryl MangesSteven Hajime Asfaw

## 2019-06-20 NOTE — Telephone Encounter (Signed)
Attempted to transfer pt's [facility] remote monitoring to our clinic. Unable to request release via [facility].  Spoke with Dr. Jacqlyn Larsen office in [place], [place]--pt is no longer followed by their office. Spoke with [facility]--pt was seen in-office 09/2018, but [facility]lin monitoring is followed by Dr. Rance Muir (also in [place], [place] @ [facility]). Left message for pacer clinic @ [phone] requesting call back to our DC.

## 2019-06-21 LAB — CUP PACEART INCLINIC DEVICE CHECK
Battery Remaining Longevity: 124 mo
Battery Voltage: 2.99 V
Brady Statistic RA Percent Paced: 76 %
Brady Statistic RV Percent Paced: 0.02 %
Date Time Interrogation Session: 20200824191728
Implantable Lead Implant Date: 20151111
Implantable Lead Implant Date: 20151111
Implantable Lead Location: 753859
Implantable Lead Location: 753860
Implantable Pulse Generator Implant Date: 20151111
Lead Channel Impedance Value: 475 Ohm
Lead Channel Impedance Value: 587.5 Ohm
Lead Channel Pacing Threshold Amplitude: 0.5 V
Lead Channel Pacing Threshold Amplitude: 0.5 V
Lead Channel Pacing Threshold Amplitude: 0.5 V
Lead Channel Pacing Threshold Amplitude: 0.5 V
Lead Channel Pacing Threshold Pulse Width: 0.4 ms
Lead Channel Pacing Threshold Pulse Width: 0.4 ms
Lead Channel Pacing Threshold Pulse Width: 0.4 ms
Lead Channel Pacing Threshold Pulse Width: 0.4 ms
Lead Channel Sensing Intrinsic Amplitude: 12 mV
Lead Channel Sensing Intrinsic Amplitude: 4.1 mV
Lead Channel Setting Pacing Amplitude: 2 V
Lead Channel Setting Pacing Amplitude: 2.5 V
Lead Channel Setting Pacing Pulse Width: 0.4 ms
Lead Channel Setting Sensing Sensitivity: 0.5 mV
Pulse Gen Model: 2240
Pulse Gen Serial Number: 7694052

## 2019-06-24 ENCOUNTER — Other Ambulatory Visit: Payer: Self-pay | Admitting: Family Medicine

## 2019-06-24 DIAGNOSIS — G4700 Insomnia, unspecified: Secondary | ICD-10-CM

## 2019-06-27 ENCOUNTER — Encounter: Payer: Self-pay | Admitting: Family Medicine

## 2019-06-27 ENCOUNTER — Other Ambulatory Visit: Payer: Self-pay | Admitting: Family Medicine

## 2019-06-27 MED ORDER — ATORVASTATIN CALCIUM 80 MG PO TABS: 80.0000 mg | ORAL_TABLET | Freq: Every day | ORAL | 1 refills | Status: DC

## 2019-06-27 NOTE — Telephone Encounter (Signed)
Attempted to call [facility] at [facility]. The number was busy.

## 2019-06-28 NOTE — Telephone Encounter (Signed)
Attempted to call back. No answer / number busy.

## 2019-06-29 ENCOUNTER — Other Ambulatory Visit: Payer: Self-pay | Admitting: Physician Assistant

## 2019-07-05 ENCOUNTER — Ambulatory Visit
Admission: RE | Admit: 2019-07-05 | Discharge: 2019-07-05 | Disposition: A | Discharge status: Home / Self Care | Payer: BC Managed Care – PPO | Source: Ambulatory Visit | Attending: Family Medicine | Admitting: Family Medicine

## 2019-07-05 ENCOUNTER — Other Ambulatory Visit: Payer: Self-pay

## 2019-07-05 DIAGNOSIS — Z1239 Encounter for other screening for malignant neoplasm of breast: Secondary | ICD-10-CM

## 2019-07-05 DIAGNOSIS — Z1231 Encounter for screening mammogram for malignant neoplasm of breast: Secondary | ICD-10-CM | POA: Diagnosis not present

## 2019-07-06 ENCOUNTER — Ambulatory Visit: Payer: BC Managed Care – PPO

## 2019-07-13 ENCOUNTER — Encounter: Payer: Self-pay | Admitting: Family Medicine

## 2019-07-13 NOTE — Telephone Encounter (Signed)
I can see that exam ended, but I don't see result put in chart. Can you check with [facility] imaging and see if this mammogram was read/resulted so we can get results to patient? Thanks!

## 2019-07-14 NOTE — Telephone Encounter (Signed)
I called the [facility] and spoke with [facility]  She stated there is a note in the patients chart stating they are awaiting the films from her previous mammogram prior to completing the report.  Message forwarded to Dr Ethlyn Gallery and the pt via [place] message.

## 2019-07-18 ENCOUNTER — Ambulatory Visit: Payer: BC Managed Care – PPO | Admitting: Cardiology

## 2019-07-19 NOTE — Telephone Encounter (Signed)
Spoke w/ operator message taking to have device clinic return our call.

## 2019-07-26 ENCOUNTER — Other Ambulatory Visit: Payer: Self-pay | Admitting: Family Medicine

## 2019-07-26 DIAGNOSIS — R928 Other abnormal and inconclusive findings on diagnostic imaging of breast: Secondary | ICD-10-CM

## 2019-07-27 ENCOUNTER — Telehealth: Payer: Self-pay
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
## 2019-07-27 NOTE — Telephone Encounter (Signed)
Duplicate encounter. See phone note from 06/20/19.
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
## 2019-08-02 ENCOUNTER — Telehealth (INDEPENDENT_AMBULATORY_CARE_PROVIDER_SITE_OTHER): Payer: BC Managed Care – PPO | Admitting: Family Medicine

## 2019-08-02 ENCOUNTER — Encounter: Payer: Self-pay | Admitting: Family Medicine

## 2019-08-02 ENCOUNTER — Other Ambulatory Visit: Payer: Self-pay

## 2019-08-02 DIAGNOSIS — K3184 Gastroparesis: Secondary | ICD-10-CM | POA: Diagnosis not present

## 2019-08-02 DIAGNOSIS — G43909 Migraine, unspecified, not intractable, without status migrainosus: Secondary | ICD-10-CM

## 2019-08-02 DIAGNOSIS — R110 Nausea: Secondary | ICD-10-CM

## 2019-08-02 MED ORDER — ONDANSETRON HCL 4 MG PO TABS: 4.0000 mg | ORAL_TABLET | Freq: Three times a day (TID) | ORAL | 1 refills | Status: DC | PRN

## 2019-08-02 NOTE — Progress Notes (Signed)
Virtual Visit via Video Note  I connected with Dana Harvey on 08/02/19 at  2:00 PM EDT by a video enabled telemedicine application 2/2 COVID-19 pandemic and verified that I am speaking with the correct person using two identifiers.  Location patient: home Location provider:work or home office Persons participating in the virtual visit: patient, provider  I discussed the limitations of evaluation and management by telemedicine and the availability of in person appointments. The patient expressed understanding and agreed to proceed.   HPI: Pt is a 58 yo female with pmh sig for IBS, gastroparesis, GERD, PAF, h/o ablation now with pacemaker, s/p cholecystectomy, anxiety and depression typically seen by Dr. Hassan Rowan.  Pt seen today for acute concern of increased nausea x the last few wks.  The feeling starts 1 hour after waking up.  Pt has been unable to vomit, but feels it would make her feel better if she could.  Pantoprazole helps some.  Pt denies wt loss.  Weight stable, 123-125 lbs.  Pt denies heart burn, abd pain, CP, sour acid taste in mouth, sore throat, pregnancy-s/p hysterectomy.  Pt endorses increased anxiety after mammogram results showed a mass.  Was on motegrity for gastroparesis, but had to stop 2/2 increased depression/suicidal thoughts. Feels like bowels are moving well at this time.  Using more ibuprofen, 1200 mg at a time for HAs.  Pain in R posterior neck, moves to R parietal area before becoming a stabbing sensation in R eye.   ROS: See pertinent positives and negatives per HPI.  Past Medical History:  Diagnosis Date  . Anxiety   . Chest pain at rest    normal cors on cath in 2012. Normal coronary CT in 2018. Normal stress test in 05/2019  . CVA (cerebral vascular accident) ([facility]) 1997  . Depression   . Diverticulitis   . Dyspnea    previous PFT showed normal lung volumes, severe diffusion defect with DLCO 43% predicted  . Fainting spell   . Frequent headaches   . GERD  (gastroesophageal reflux disease)   . Heart murmur   . Insomnia   . Migraines   . Pacemaker    St Jude dual chamber PPM placed on 09/06/2014 by Dr. Gaynelle Adu in [place]  . PAF (paroxysmal atrial fibrillation) ([facility])    diagnosed by Dr. Gaynelle Adu, remote history, no recurrent  . Polyp of intestine 2018   benign per patient  . Seizures ([facility])    secondary to potassium level dropping (due to opioid medications per patient)  . Thyroid disease     Past Surgical History:  Procedure Laterality Date  . ABDOMINAL HYSTERECTOMY  1993   heavy vaginal bleeding; later went in and removed Right ovary  . APPENDECTOMY  1984  . ATRIAL ABLATION SURGERY  2012  . BREAST EXCISIONAL BIOPSY     bilateral; benign  . CARDIAC CATHETERIZATION  2012   normal cors on cath in [facility] [place]  . CESAREAN SECTION     [phone]  . CHOLECYSTECTOMY  2010  . ELECTROPHYSIOLOGY STUDY  around 2013   with ablation for clinically suspected inappropriate sinus tachycardia, by Dr. Annice Pih  . LOOP RECORDER IMPLANT  2013   by Dr. Annice Pih  . PACEMAKER INSERTION  09/06/2014   St Jude Assurity dual chamber PPM, Dr Onalee Hua [place]--per patient; put in after ablation    Family History  Problem Relation Age of Onset  . Osteoarthritis Mother   . Alcohol abuse Mother   . Arthritis Mother   .  COPD Mother   . Depression Mother   . Hearing loss Mother   . Hyperlipidemia Mother   . Hypertension Mother   . Cancer Father   . Diabetes Father   . Heart disease Father 71       CABG  . Hypertension Father   . Alcohol abuse Brother   . Cancer Brother   . Depression Brother   . Drug abuse Brother   . Alcohol abuse Son   . Drug abuse Son   . Early death Maternal Grandmother 80  . Heart disease Maternal Grandmother   . Alcohol abuse Maternal Grandfather   . Heart disease Maternal Grandfather 70  . Cancer Paternal Grandmother   . Heart disease Paternal Grandmother   . Cancer Paternal Grandfather   . Heart disease  Paternal Grandfather   . Alcohol abuse Brother   . Depression Brother   . Diabetes Brother   . Drug abuse Brother   . Hypertension Brother   . Hyperlipidemia Brother      Current Outpatient Medications:  .  ALPRAZolam (XANAX) 0.5 MG tablet, TAKE 1/2 TO 1 TABLETS (0.25-0.5 MG TOTAL) BY MOUTH 2 TIMES DAILY AS NEEDED FOR ANXIETY., Disp: 60 tablet, Rfl: 0 .  atorvastatin (LIPITOR) 80 MG tablet, Take 1 tablet (80 mg total) by mouth daily., Disp: 90 tablet, Rfl: 1 .  pantoprazole (PROTONIX) 40 MG tablet, Take 40 mg by mouth daily as needed (GERD)., Disp: , Rfl:  .  traZODone (DESYREL) 100 MG tablet, Take 100 mg by mouth at bedtime., Disp: , Rfl:  .  zolpidem (AMBIEN CR) 6.25 MG CR tablet, TAKE 1 TABLET (6.25 MG TOTAL) BY MOUTH AT BEDTIME AS NEEDED FOR SLEEP., Disp: 30 tablet, Rfl: 0  EXAM:  VITALS per patient if applicable:  GENERAL: alert, oriented, appears well and in no acute distress  HEENT: atraumatic, conjunctiva clear, no obvious abnormalities on inspection of external nose and ears  NECK: normal movements of the head and neck  LUNGS: on inspection no signs of respiratory distress, breathing rate appears normal, no obvious gross SOB, gasping or wheezing  CV: no obvious cyanosis  MS: moves all visible extremities without noticeable abnormality  PSYCH/NEURO: pleasant and cooperative, no obvious depression or anxiety, speech and thought processing grossly intact  ASSESSMENT AND PLAN:  Discussed the following assessment and plan:  Nausea  -discussed possible causes including gastroparesis, gastric ulcers (2/2 increased NSAID use), reflux, etc.  Thought s/p cholecystectomy consider blocked pancreatic duct - Plan: ondansetron (ZOFRAN) 4 MG tablet, Comprehensive metabolic panel, Lipase, CBC (no diff), TSH  Gastroparesis -discussed various medication options including reglan if not previously tried. -f/u with GI recommended.  Migraine without status migrainosus, not  intractable, unspecified migraine type  -pt advised to limit NASAID use -discussed HA prevention (hydration, decreasing caffeine intake, getting enough sleep) - Plan: CBC (no diff)  Pt wishes to wait on labs until after she has f/u for her mammogram. Recommend f/u with pcp in the next 1-2 wks.   I discussed the assessment and treatment plan with the patient. The patient was provided an opportunity to ask questions and all were answered. The patient agreed with the plan and demonstrated an understanding of the instructions.   The patient was advised to call back or seek an in-person evaluation if the symptoms worsen or if the condition fails to improve as anticipated.   Billie Ruddy, MD

## 2019-08-03 ENCOUNTER — Ambulatory Visit
Admission: RE | Admit: 2019-08-03 | Discharge: 2019-08-03 | Disposition: A | Discharge status: Home / Self Care | Payer: BC Managed Care – PPO | Source: Ambulatory Visit | Attending: Family Medicine | Admitting: Family Medicine

## 2019-08-03 ENCOUNTER — Other Ambulatory Visit: Payer: Self-pay | Admitting: Family Medicine

## 2019-08-03 ENCOUNTER — Ambulatory Visit: Payer: BC Managed Care – PPO

## 2019-08-03 ENCOUNTER — Other Ambulatory Visit: Payer: Self-pay

## 2019-08-03 DIAGNOSIS — N6310 Unspecified lump in the right breast, unspecified quadrant: Secondary | ICD-10-CM

## 2019-08-03 DIAGNOSIS — R928 Other abnormal and inconclusive findings on diagnostic imaging of breast: Secondary | ICD-10-CM

## 2019-08-03 DIAGNOSIS — R921 Mammographic calcification found on diagnostic imaging of breast: Secondary | ICD-10-CM | POA: Diagnosis not present

## 2019-08-03 DIAGNOSIS — N6320 Unspecified lump in the left breast, unspecified quadrant: Secondary | ICD-10-CM

## 2019-08-04 ENCOUNTER — Telehealth: Payer: BC Managed Care – PPO | Admitting: Internal Medicine

## 2019-08-12 ENCOUNTER — Other Ambulatory Visit: Payer: Self-pay

## 2019-08-12 ENCOUNTER — Ambulatory Visit
Admission: RE | Admit: 2019-08-12 | Discharge: 2019-08-12 | Disposition: A | Discharge status: Home / Self Care | Payer: BC Managed Care – PPO | Source: Ambulatory Visit | Attending: Family Medicine | Admitting: Family Medicine

## 2019-08-12 DIAGNOSIS — N6320 Unspecified lump in the left breast, unspecified quadrant: Secondary | ICD-10-CM

## 2019-08-12 DIAGNOSIS — R921 Mammographic calcification found on diagnostic imaging of breast: Secondary | ICD-10-CM | POA: Diagnosis not present

## 2019-08-12 DIAGNOSIS — N6489 Other specified disorders of breast: Secondary | ICD-10-CM | POA: Diagnosis not present

## 2019-08-17 ENCOUNTER — Other Ambulatory Visit: Payer: Self-pay | Admitting: Family Medicine

## 2019-08-17 DIAGNOSIS — G4700 Insomnia, unspecified: Secondary | ICD-10-CM

## 2019-08-30 NOTE — Telephone Encounter (Signed)
LM (HIPAA-compliant) for Dana Harvey at [facility] requesting call back to DC. Direct number given.

## 2019-09-14 NOTE — Telephone Encounter (Signed)
Lmovm with direct office number to call back.

## 2019-09-27 ENCOUNTER — Telehealth: Payer: Self-pay

## 2019-09-27 ENCOUNTER — Emergency Department (HOSPITAL_COMMUNITY)
Admission: EM | Admit: 2019-09-27 | Discharge: 2019-09-27 | Disposition: A | Discharge status: Home / Self Care | Payer: BC Managed Care – PPO | Attending: Emergency Medicine | Admitting: Emergency Medicine

## 2019-09-27 ENCOUNTER — Other Ambulatory Visit: Payer: Self-pay

## 2019-09-27 ENCOUNTER — Emergency Department (HOSPITAL_COMMUNITY): Payer: BC Managed Care – PPO

## 2019-09-27 DIAGNOSIS — R519 Headache, unspecified: Secondary | ICD-10-CM | POA: Diagnosis not present

## 2019-09-27 DIAGNOSIS — H538 Other visual disturbances: Secondary | ICD-10-CM | POA: Diagnosis not present

## 2019-09-27 DIAGNOSIS — E079 Disorder of thyroid, unspecified: Secondary | ICD-10-CM | POA: Insufficient documentation

## 2019-09-27 DIAGNOSIS — G43909 Migraine, unspecified, not intractable, without status migrainosus: Secondary | ICD-10-CM | POA: Diagnosis not present

## 2019-09-27 DIAGNOSIS — Z950 Presence of cardiac pacemaker: Secondary | ICD-10-CM | POA: Insufficient documentation

## 2019-09-27 DIAGNOSIS — R110 Nausea: Secondary | ICD-10-CM | POA: Diagnosis not present

## 2019-09-27 DIAGNOSIS — H532 Diplopia: Secondary | ICD-10-CM | POA: Diagnosis not present

## 2019-09-27 DIAGNOSIS — H539 Unspecified visual disturbance: Secondary | ICD-10-CM | POA: Insufficient documentation

## 2019-09-27 DIAGNOSIS — Z720 Tobacco use: Secondary | ICD-10-CM | POA: Insufficient documentation

## 2019-09-27 DIAGNOSIS — M542 Cervicalgia: Secondary | ICD-10-CM | POA: Diagnosis not present

## 2019-09-27 DIAGNOSIS — R52 Pain, unspecified: Secondary | ICD-10-CM | POA: Diagnosis not present

## 2019-09-27 DIAGNOSIS — Z20828 Contact with and (suspected) exposure to other viral communicable diseases: Secondary | ICD-10-CM | POA: Insufficient documentation

## 2019-09-27 DIAGNOSIS — R42 Dizziness and giddiness: Secondary | ICD-10-CM | POA: Diagnosis not present

## 2019-09-27 DIAGNOSIS — Z79899 Other long term (current) drug therapy: Secondary | ICD-10-CM | POA: Diagnosis not present

## 2019-09-27 DIAGNOSIS — G4489 Other headache syndrome: Secondary | ICD-10-CM | POA: Diagnosis not present

## 2019-09-27 LAB — I-STAT CHEM 8, ED
BUN: 5 mg/dL — ABNORMAL LOW (ref 6–20)
Calcium, Ion: 1.18 mmol/L (ref 1.15–1.40)
Chloride: 106 mmol/L (ref 98–111)
Creatinine, Ser: 0.6 mg/dL (ref 0.44–1.00)
Glucose, Bld: 95 mg/dL (ref 70–99)
HCT: 38 % (ref 36.0–46.0)
Hemoglobin: 12.9 g/dL (ref 12.0–15.0)
Potassium: 3.8 mmol/L (ref 3.5–5.1)
Sodium: 141 mmol/L (ref 135–145)
TCO2: 25 mmol/L (ref 22–32)

## 2019-09-27 LAB — COMPREHENSIVE METABOLIC PANEL
ALT: 18 U/L (ref 0–44)
AST: 20 U/L (ref 15–41)
Albumin: 4 g/dL (ref 3.5–5.0)
Alkaline Phosphatase: 66 U/L (ref 38–126)
Anion gap: 10 (ref 5–15)
BUN: 6 mg/dL (ref 6–20)
CO2: 24 mmol/L (ref 22–32)
Calcium: 9.4 mg/dL (ref 8.9–10.3)
Chloride: 107 mmol/L (ref 98–111)
Creatinine, Ser: 0.74 mg/dL (ref 0.44–1.00)
GFR calc Af Amer: >60 mL/min (ref >60)
GFR calc non Af Amer: >60 mL/min (ref >60)
Glucose, Bld: 99 mg/dL (ref 70–99)
Potassium: 3.8 mmol/L (ref 3.5–5.1)
Sodium: 141 mmol/L (ref 135–145)
Total Bilirubin: 0.4 mg/dL (ref 0.3–1.2)
Total Protein: 6.3 g/dL — ABNORMAL LOW (ref 6.5–8.1)

## 2019-09-27 LAB — CBC
HCT: 38.8 % (ref 36.0–46.0)
Hemoglobin: 12.9 g/dL (ref 12.0–15.0)
MCH: 32 pg (ref 26.0–34.0)
MCHC: 33.2 g/dL (ref 30.0–36.0)
MCV: 96.3 fL (ref 80.0–100.0)
Platelets: 204 10*3/uL (ref 150–400)
RBC: 4.03 MIL/uL (ref 3.87–5.11)
RDW: 13.2 % (ref 11.5–15.5)
WBC: 7.9 10*3/uL (ref 4.0–10.5)
nRBC: 0 % (ref 0.0–0.2)

## 2019-09-27 LAB — DIFFERENTIAL
Abs Immature Granulocytes: 0.03 10*3/uL (ref 0.00–0.07)
Basophils Absolute: 0 10*3/uL (ref 0.0–0.1)
Basophils Relative: 1 %
Eosinophils Absolute: 0 10*3/uL (ref 0.0–0.5)
Eosinophils Relative: 0 %
Immature Granulocytes: 0 %
Lymphocytes Relative: 34 %
Lymphs Abs: 2.7 10*3/uL (ref 0.7–4.0)
Monocytes Absolute: 0.4 10*3/uL (ref 0.1–1.0)
Monocytes Relative: 5 %
Neutro Abs: 4.7 10*3/uL (ref 1.7–7.7)
Neutrophils Relative %: 60 %

## 2019-09-27 LAB — I-STAT BETA HCG BLOOD, ED (MC, WL, AP ONLY): I-stat hCG, quantitative: <5 m[IU]/mL (ref <5)

## 2019-09-27 LAB — PROTIME-INR
INR: 1 (ref 0.8–1.2)
Prothrombin Time: 12.8 seconds (ref 11.4–15.2)

## 2019-09-27 LAB — CK: Total CK: 279 U/L — ABNORMAL HIGH (ref 38–234)

## 2019-09-27 LAB — CBG MONITORING, ED: Glucose-Capillary: 99 mg/dL (ref 70–99)

## 2019-09-27 LAB — APTT: aPTT: 29 seconds (ref 24–36)

## 2019-09-27 MED ORDER — LIDOCAINE 5 % EX PTCH: 1.0000 | MEDICATED_PATCH | CUTANEOUS | 0 refills | Status: DC

## 2019-09-27 MED ORDER — IOHEXOL 350 MG/ML SOLN
100.0000 mL | Freq: Once | INTRAVENOUS | Status: AC | PRN
  Administered 2019-09-27: 100 mL via INTRAVENOUS

## 2019-09-27 MED ORDER — METHOCARBAMOL 500 MG PO TABS: 500.0000 mg | ORAL_TABLET | Freq: Two times a day (BID) | ORAL | 0 refills | Status: DC

## 2019-09-27 MED ORDER — SODIUM CHLORIDE 0.9% FLUSH
3.0000 mL | Freq: Once | INTRAVENOUS | Status: DC
Start: 2019-09-27 — End: 2019-09-28

## 2019-09-27 MED ORDER — TETRACAINE HCL 0.5 % OP SOLN
2.0000 [drp] | Freq: Once | OPHTHALMIC | Status: AC
  Administered 2019-09-27: 2 [drp] via OPHTHALMIC
  Filled 2019-09-27: qty 4

## 2019-09-27 MED ORDER — SODIUM CHLORIDE 0.9 % IV BOLUS
1000.0000 mL | Freq: Once | INTRAVENOUS | Status: AC
  Administered 2019-09-27: 1000 mL via INTRAVENOUS

## 2019-09-27 MED ORDER — METOCLOPRAMIDE HCL 10 MG PO TABS: 10.0000 mg | ORAL_TABLET | Freq: Four times a day (QID) | ORAL | 0 refills | Status: DC

## 2019-09-27 MED ORDER — METHYLPREDNISOLONE SODIUM SUCC 125 MG IJ SOLR
125.0000 mg | Freq: Once | INTRAMUSCULAR | Status: AC
  Administered 2019-09-27: 125 mg via INTRAVENOUS
  Filled 2019-09-27: qty 2

## 2019-09-27 MED ORDER — METOCLOPRAMIDE HCL 5 MG/ML IJ SOLN
10.0000 mg | Freq: Once | INTRAMUSCULAR | Status: AC
  Administered 2019-09-27: 10 mg via INTRAVENOUS
  Filled 2019-09-27: qty 2

## 2019-09-27 NOTE — Telephone Encounter (Signed)
I called [facility] tech support and the tech lady helped me enrolled the pt into our system. I scheduled the pt for a transmission for 10-11-2019.

## 2019-09-27 NOTE — Telephone Encounter (Signed)
   MF 1   Dana Harvey  Female, 58 y.o., September 06, 1961  MRN: [id number]     Phone: [phone] Jerilynn Mages)  PCP: Caren Macadam, MD  Coverage: Sherre Poot Blue [employer] Comm Ppo       Next Appt  With Cardiology ([facility] Device Remotes) 09/29/2019 at 9:00 AM          Patient Calls You routed conversation to [facility] [place] Just now (12:44 PM)      You Just now (12:44 PM)   I called [facility] tech support and the tech lady helped me enrolled the pt into our system. I scheduled the pt for a transmission for 10-11-2019.      Documentation        You routed conversation to [facility] Device 13 minutes ago (12:32 PM)      You 13 minutes ago (12:32 PM)   I spoke to the nurse in the device clinic at dr. Radford Pax office and she states the pt has not been seen by them since January of 2019. She tried to release the pt in [facility] however she did not find the pt in [facility] to release her.      Documentation        You (Name not recorded) 27 minutes ago (12:17 PM)   dr. Radford Pax office    Outgoing call                    Recent Patient Communication   Last Update Reason Specialty     Today - Cardiology    [facility], [place] D Closed    1 month ago Medication Refill Family Medicine    [facility] [place], [place]

## 2019-09-27 NOTE — ED Triage Notes (Signed)
Pt here for headache x 3 weeks, worsening today to the worst headache of her life while at work so they called EMS. Pt endorses nausea, double vision and blurred vision in R eye. 4 mg zofran given PTA without relief.
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
## 2019-09-27 NOTE — Discharge Instructions (Signed)
°  Antiinflammatory medications: Take 600 mg of ibuprofen every 6 hours or 440 mg (over the counter dose) to 500 mg (prescription dose) of naproxen every 12 hours for the next 3 days. After this time, these medications may be used as needed for pain. Take these medications with food to avoid upset stomach. Choose only one of these medications, do not take them together. Acetaminophen (generic for Tylenol): Should you continue to have additional pain while taking the ibuprofen or naproxen, you may add in acetaminophen as needed. Your daily total maximum amount of acetaminophen from all sources should be limited to 4000mg /day for persons without liver problems, or 2000mg /day for those with liver problems. Methocarbamol: Methocarbamol (generic for Robaxin) is a muscle relaxer and can help relieve stiff muscles or muscle spasms.  Do not drive or perform other dangerous activities while taking this medication as it can cause drowsiness as well as changes in reaction time and judgement. Lidocaine patches: These are available via either prescription or over-the-counter. The over-the-counter option may be more economical one and are likely just as effective. There are multiple over-the-counter brands, such as Salonpas. Exercises: Be sure to perform the attached exercises starting with three times a week and working up to performing them daily. This is an essential part of preventing long term problems.  Follow up: Follow-up with the ophthalmologist tomorrow morning, December 2, at 8:30 AM in the office. May need to follow-up with primary care provider or neurologist should headaches recur. Return: Return to the ED should symptoms worsen.  For prescription assistance, may try using prescription discount sites or apps, such as goodrx.com  Test Results for COVID-19 pending  You have a test pending for COVID-19.  Results typically return within about 48 hours.  Be sure to check MyChart for updated results.  We  recommend isolating yourself until results are received.  Patients who have symptoms consistent with COVID-19 should self isolated for: At least 3 days (72 hours) have passed since recovery, defined as resolution of fever without the use of fever reducing medications and improvement in respiratory symptoms (e.g., cough, shortness of breath), and At least 7 days have passed since symptoms first appeared.  If you have no symptoms, but your test returns positive, recommend isolating for at least 10 days.

## 2019-09-27 NOTE — ED Notes (Signed)
Patient verbalizes understanding of discharge instructions. Opportunity for questioning and answers were provided. Armband removed by staff, pt discharged from ED. Pt. ambulatory and discharged home.  

## 2019-09-27 NOTE — Telephone Encounter (Signed)
I spoke to the nurse in the device clinic at dr. Radford Pax office and she states the pt has not been seen by them since January of 2019. She tried to release the pt in [facility] however she did not find the pt in [facility] to release her.

## 2019-09-28 DIAGNOSIS — H532 Diplopia: Secondary | ICD-10-CM | POA: Diagnosis not present

## 2019-09-28 DIAGNOSIS — G43909 Migraine, unspecified, not intractable, without status migrainosus: Secondary | ICD-10-CM | POA: Diagnosis not present

## 2019-09-28 DIAGNOSIS — H5203 Hypermetropia, bilateral: Secondary | ICD-10-CM | POA: Diagnosis not present

## 2019-09-28 DIAGNOSIS — H5310 Unspecified subjective visual disturbances: Secondary | ICD-10-CM | POA: Diagnosis not present

## 2019-09-28 LAB — SARS CORONAVIRUS 2 (TAT 6-24 HRS): SARS Coronavirus 2: NEGATIVE

## 2019-09-29 ENCOUNTER — Telehealth: Payer: Self-pay

## 2019-09-29 NOTE — Telephone Encounter (Signed)
LMOVM for pt to send a transmission with her home monitor.  Pt called back and I tried to help her send a manual transmission with her home monitor. It did not come through. I gave her the number to [facility] support to get additional help.

## 2019-10-04 ENCOUNTER — Encounter: Payer: Self-pay | Admitting: Family Medicine

## 2019-10-04 ENCOUNTER — Telehealth: Payer: Self-pay | Admitting: *Deleted

## 2019-10-04 ENCOUNTER — Encounter: Payer: Self-pay | Admitting: *Deleted

## 2019-10-04 NOTE — Telephone Encounter (Signed)
See follow up charts message.

## 2019-10-04 NOTE — Telephone Encounter (Signed)
LMOVM for pt about following up to see if she called [facility] about her monitor. I left my direct office number for the pt to call back.

## 2019-10-04 NOTE — Telephone Encounter (Signed)
Copied from [place] [id number]. Topic: General - Inquiry >> Oct 04, 2019  8:37 AM Oneta Rack wrote: Reason for CRM: Patient states she's returning "Wendie Simmer" call stating every since her breast exam no one has reached out to her to follow up with her. Patient sent a My Chart message regarding the return call #. Patient states if an appointment is needed she's available for virtual only

## 2019-10-04 NOTE — Telephone Encounter (Signed)
Message addressed in follow up chart note.

## 2019-10-04 NOTE — Telephone Encounter (Signed)
Spoke with the pt and sent message with response to PCP.

## 2019-10-06 NOTE — Telephone Encounter (Signed)
Please help patient schedule if she has not yet made an appointment.  I have had multiple patients tell me that they have had difficulty scheduling an appointment with me and being told there is no availability.

## 2019-10-07 ENCOUNTER — Ambulatory Visit (INDEPENDENT_AMBULATORY_CARE_PROVIDER_SITE_OTHER): Payer: BC Managed Care – PPO | Admitting: *Deleted

## 2019-10-07 DIAGNOSIS — Z950 Presence of cardiac pacemaker: Secondary | ICD-10-CM | POA: Diagnosis not present

## 2019-10-10 LAB — CUP PACEART REMOTE DEVICE CHECK
Battery Remaining Longevity: 115 mo
Battery Remaining Percentage: 95.5 %
Battery Voltage: 2.99 V
Brady Statistic AP VP Percent: 1 %
Brady Statistic AP VS Percent: 79 %
Brady Statistic AS VP Percent: 1 %
Brady Statistic AS VS Percent: 21 %
Brady Statistic RA Percent Paced: 76 %
Brady Statistic RV Percent Paced: 1 %
Date Time Interrogation Session: 20201211214932
Implantable Lead Implant Date: 20151111
Implantable Lead Implant Date: 20151111
Implantable Lead Location: 753859
Implantable Lead Location: 753860
Implantable Pulse Generator Implant Date: 20151111
Lead Channel Impedance Value: 460 Ohm
Lead Channel Impedance Value: 530 Ohm
Lead Channel Pacing Threshold Amplitude: 0.5 V
Lead Channel Pacing Threshold Amplitude: 0.5 V
Lead Channel Pacing Threshold Pulse Width: 0.4 ms
Lead Channel Pacing Threshold Pulse Width: 0.4 ms
Lead Channel Sensing Intrinsic Amplitude: 12 mV
Lead Channel Sensing Intrinsic Amplitude: 5 mV
Lead Channel Setting Pacing Amplitude: 2 V
Lead Channel Setting Pacing Amplitude: 2.5 V
Lead Channel Setting Pacing Pulse Width: 0.4 ms
Lead Channel Setting Sensing Sensitivity: 0.5 mV
Pulse Gen Model: 2240
Pulse Gen Serial Number: 7694052

## 2019-10-10 NOTE — Telephone Encounter (Signed)
Transmission added to schedule for processing. 

## 2019-10-10 NOTE — Telephone Encounter (Signed)
Transmission received 10/07/2019 

## 2019-10-12 ENCOUNTER — Other Ambulatory Visit: Payer: Self-pay

## 2019-10-12 ENCOUNTER — Encounter: Payer: Self-pay | Admitting: Family Medicine

## 2019-10-12 ENCOUNTER — Telehealth (INDEPENDENT_AMBULATORY_CARE_PROVIDER_SITE_OTHER): Payer: BC Managed Care – PPO | Admitting: Family Medicine

## 2019-10-12 VITALS — BP 102/68 | HR 69 | Temp 97.9°F

## 2019-10-12 DIAGNOSIS — E785 Hyperlipidemia, unspecified: Secondary | ICD-10-CM

## 2019-10-12 DIAGNOSIS — R748 Abnormal levels of other serum enzymes: Secondary | ICD-10-CM

## 2019-10-12 MED ORDER — PANTOPRAZOLE SODIUM 40 MG PO TBEC: 40.0000 mg | DELAYED_RELEASE_TABLET | Freq: Two times a day (BID) | ORAL | Status: DC

## 2019-10-12 NOTE — Progress Notes (Signed)
Virtual Visit via Video Note  I connected with Dana Harvey  on 10/12/19 at  3:00 PM EST by a video enabled telemedicine application and verified that I am speaking with the correct person using two identifiers.  Location patient: home Location provider:work or home office Persons participating in the virtual visit: patient, provider  I discussed the limitations of evaluation and management by telemedicine and the availability of in person appointments. The patient expressed understanding and agreed to proceed.   Dana Harvey DOB: Jul 26, 1961 Encounter date: 10/12/2019  This is a 58 y.o. female who presents with Chief Complaint  Patient presents with  . Follow-up    History of present illness: Recent ER visit Thinks a lot of issues were work related stressors/stomach issues and thinks that this all lead up to migraine explosion. Hasn't had migraine in 5 years but this one just hung in there and wouldn't go away. Was taking '2400mg'$  ibuprofen daily to try and control it. Then kept having rainbow aura x 10 min over right eye; repeat episode a week later. Even tried [place] sample at work. Then got to point where she couldn't see. That was a Tuesday or weds night and went to hospital. Cocktail they gave her at hospital, didn't have migraine next day. Stopped zolpidem, trazodone. PA at ER thought that trazodone was causing rebound headaches. Just taking alprazolam and slept all night long. Got I watch and noticed she was getting only 4 hours of sleep in last month and then with the alprazolam she is sleeping 8 hours/night and sleeping all night long and even states that REM is better - from 20 minutes to 3.5 hours.   Also cut out soda. Last headache was Sunday. 3 days without headache.   Still stress at work. 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 Son is still using heroin (as is his wife) and has basically kicked her out of his life.   Doing better with  heart racing and breathing. Also has had resolution of leg cramps, charlie horses. When she met with cardio they suggested pedialyte which she does three times/week and this has really helped.    Allergies  Allergen Reactions  . Penicillins Anaphylaxis  . Aspirin Tinitus  . Crestor [Rosuvastatin Calcium]   . Nystatin     Cough   Current Meds  Medication Sig  . ALPRAZolam (XANAX) 0.5 MG tablet TAKE 1/2 TO 1 TABLETS (0.25-0.5 MG TOTAL) BY MOUTH 2 TIMES DAILY AS NEEDED FOR ANXIETY.  Marland Kitchen atorvastatin (LIPITOR) 80 MG tablet Take 1 tablet (80 mg total) by mouth daily.  . ondansetron (ZOFRAN) 4 MG tablet Take 1 tablet (4 mg total) by mouth every 8 (eight) hours as needed for nausea or vomiting.  . pantoprazole (PROTONIX) 40 MG tablet Take 40 mg by mouth daily as needed (GERD).  Marland Kitchen traZODone (DESYREL) 100 MG tablet Take 100 mg by mouth at bedtime.  Marland Kitchen zolpidem (AMBIEN CR) 6.25 MG CR tablet TAKE 1 TABLET (6.25 MG TOTAL) BY MOUTH AT BEDTIME AS NEEDED FOR SLEEP.    Review of Systems  Constitutional: Negative for chills, fatigue and fever.  Respiratory: Negative for cough, chest tightness, shortness of breath and wheezing.   Cardiovascular: Negative for chest pain, palpitations and leg swelling.    Objective:  BP 102/68   Pulse 69   Temp 97.9 F (36.6 C)       BP Readings from Last 3 Encounters:  10/12/19 102/68  09/27/19 (!) 147/84  06/20/19 106/74   Wt Readings from Last 3 Encounters:  06/20/19 125 lb 3.2 oz (56.8 kg)  06/02/19 123 lb 9.6 oz (56.1 kg)  05/20/19 123 lb 11.2 oz (56.1 kg)    EXAM:  GENERAL: alert, oriented, appears well and in no acute distress  HEENT: atraumatic, conjunctiva clear, no obvious abnormalities on inspection of external nose and ears  NECK: normal movements of the head and neck  LUNGS: on inspection no signs of respiratory distress, breathing rate appears normal, no obvious gross SOB, gasping or wheezing  CV: no obvious cyanosis  MS: moves all  visible extremities without noticeable abnormality  PSYCH/NEURO: pleasant and cooperative, no obvious depression or anxiety, speech and thought processing grossly intact  Assessment/Plan  1. Elevated CK - CK; Future  2. Hyperlipidemia, unspecified hyperlipidemia type - Lipid panel; Future  Return for pending lab results.    I discussed the assessment and treatment plan with the patient. The patient was provided an opportunity to ask questions and all were answered. The patient agreed with the plan and demonstrated an understanding of the instructions.   The patient was advised to call back or seek an in-person evaluation if the symptoms worsen or if the condition fails to improve as anticipated.  I provided 25 minutes of non-face-to-face time during this encounter.   Micheline Rough, MD

## 2019-10-13 ENCOUNTER — Telehealth: Payer: Self-pay | Admitting: *Deleted

## 2019-10-13 NOTE — Telephone Encounter (Signed)
-----   Message from Caren Macadam, MD sent at 10/12/2019  3:31 PM EST ----- Please set up lab visit for her

## 2019-10-13 NOTE — Telephone Encounter (Signed)
I left a detailed message at the pts cell number to call the office to schedule a fasting lab appt as below.

## 2019-10-15 ENCOUNTER — Other Ambulatory Visit: Payer: Self-pay | Admitting: Family Medicine

## 2019-10-25 ENCOUNTER — Other Ambulatory Visit: Payer: Self-pay | Admitting: Family Medicine

## 2019-10-26 ENCOUNTER — Encounter: Payer: Self-pay | Admitting: Family Medicine

## 2019-10-27 ENCOUNTER — Other Ambulatory Visit: Payer: Self-pay | Admitting: Family Medicine

## 2019-10-27 NOTE — Telephone Encounter (Signed)
Message routed to PCP CMA  

## 2019-10-27 NOTE — Telephone Encounter (Signed)
Mychart message sent to the pt stating Rx was previously sent to [facility] on 12/29.

## 2019-12-21 ENCOUNTER — Other Ambulatory Visit: Payer: Self-pay | Admitting: Family Medicine

## 2020-01-02 ENCOUNTER — Encounter: Payer: Self-pay | Admitting: Family Medicine

## 2020-01-02 ENCOUNTER — Other Ambulatory Visit: Payer: Self-pay | Admitting: Family Medicine

## 2020-01-02 MED ORDER — ALPRAZOLAM 0.5 MG PO TABS: 0.5000 mg | ORAL_TABLET | Freq: Two times a day (BID) | ORAL | 0 refills | Status: DC | PRN

## 2020-01-06 ENCOUNTER — Ambulatory Visit (INDEPENDENT_AMBULATORY_CARE_PROVIDER_SITE_OTHER): Payer: BC Managed Care – PPO | Admitting: *Deleted

## 2020-01-06 DIAGNOSIS — Z950 Presence of cardiac pacemaker: Secondary | ICD-10-CM

## 2020-01-07 LAB — CUP PACEART REMOTE DEVICE CHECK
Battery Remaining Longevity: 115 mo
Battery Remaining Percentage: 95.5 %
Battery Voltage: 2.99 V
Brady Statistic AP VP Percent: 1 %
Brady Statistic AP VS Percent: 78 %
Brady Statistic AS VP Percent: 1 %
Brady Statistic AS VS Percent: 21 %
Brady Statistic RA Percent Paced: 75 %
Brady Statistic RV Percent Paced: 1 %
Date Time Interrogation Session: 20210312040012
Implantable Lead Implant Date: 20151111
Implantable Lead Implant Date: 20151111
Implantable Lead Location: 753859
Implantable Lead Location: 753860
Implantable Pulse Generator Implant Date: 20151111
Lead Channel Impedance Value: 440 Ohm
Lead Channel Impedance Value: 530 Ohm
Lead Channel Pacing Threshold Amplitude: 0.5 V
Lead Channel Pacing Threshold Amplitude: 0.5 V
Lead Channel Pacing Threshold Pulse Width: 0.4 ms
Lead Channel Pacing Threshold Pulse Width: 0.4 ms
Lead Channel Sensing Intrinsic Amplitude: 12 mV
Lead Channel Sensing Intrinsic Amplitude: 5 mV
Lead Channel Setting Pacing Amplitude: 2 V
Lead Channel Setting Pacing Amplitude: 2.5 V
Lead Channel Setting Pacing Pulse Width: 0.4 ms
Lead Channel Setting Sensing Sensitivity: 0.5 mV
Pulse Gen Model: 2240
Pulse Gen Serial Number: 7694052

## 2020-01-12 ENCOUNTER — Telehealth: Payer: Self-pay | Admitting: Emergency Medicine

## 2020-01-12 NOTE — Telephone Encounter (Signed)
Reviewed 01/06/20 transmission results with patient. She sent myChart message to review. PPM function WNL 2 short AMS rates. Question concerning battery life, reassured that 9 yrs 7 months battery life is consistent with implant date . Asked if she could have PPM removed due to only VP < 1 % and educated that she needs ppm due to SSS and RA pacing of 75%. Patient does not feel she needs to see a cardiologist as well as EP .  Educated on fact that EP  Cares for her ppm specifically and cardiology responsible for other cardiac needs. All questions answered.

## 2020-01-13 ENCOUNTER — Other Ambulatory Visit: Payer: Self-pay | Admitting: *Deleted

## 2020-01-13 ENCOUNTER — Ambulatory Visit
Admission: RE | Admit: 2020-01-13 | Discharge: 2020-01-13 | Disposition: A | Discharge status: Home / Self Care | Payer: BC Managed Care – PPO | Source: Ambulatory Visit | Attending: *Deleted | Admitting: *Deleted

## 2020-01-13 DIAGNOSIS — R3129 Other microscopic hematuria: Secondary | ICD-10-CM | POA: Diagnosis not present

## 2020-01-13 DIAGNOSIS — R109 Unspecified abdominal pain: Secondary | ICD-10-CM

## 2020-01-13 DIAGNOSIS — N200 Calculus of kidney: Secondary | ICD-10-CM | POA: Insufficient documentation

## 2020-01-13 DIAGNOSIS — R319 Hematuria, unspecified: Secondary | ICD-10-CM

## 2020-01-20 ENCOUNTER — Other Ambulatory Visit: Payer: Self-pay | Admitting: Family Medicine

## 2020-02-15 ENCOUNTER — Telehealth: Payer: BC Managed Care – PPO | Admitting: Family Medicine

## 2020-02-22 ENCOUNTER — Other Ambulatory Visit: Payer: Self-pay | Admitting: Family Medicine

## 2020-02-22 DIAGNOSIS — G4700 Insomnia, unspecified: Secondary | ICD-10-CM

## 2020-02-23 NOTE — Telephone Encounter (Signed)
Needs a chronic condition visit

## 2020-03-07 ENCOUNTER — Telehealth: Payer: Self-pay | Admitting: Family Medicine

## 2020-03-07 NOTE — Telephone Encounter (Signed)
error 

## 2020-03-08 ENCOUNTER — Other Ambulatory Visit: Payer: Self-pay

## 2020-03-09 ENCOUNTER — Other Ambulatory Visit (INDEPENDENT_AMBULATORY_CARE_PROVIDER_SITE_OTHER): Payer: BC Managed Care – PPO

## 2020-03-09 DIAGNOSIS — F419 Anxiety disorder, unspecified: Secondary | ICD-10-CM | POA: Diagnosis not present

## 2020-03-09 DIAGNOSIS — E785 Hyperlipidemia, unspecified: Secondary | ICD-10-CM

## 2020-03-09 DIAGNOSIS — R110 Nausea: Secondary | ICD-10-CM

## 2020-03-09 LAB — TSH: TSH: 0.61 u[IU]/mL (ref 0.35–4.50)

## 2020-03-09 LAB — LIPID PANEL
Cholesterol: 185 mg/dL (ref 0–200)
HDL: 45.7 mg/dL (ref >39.00)
LDL Cholesterol: 117 mg/dL — ABNORMAL HIGH (ref 0–99)
NonHDL: 139.01
Total CHOL/HDL Ratio: 4
Triglycerides: 111 mg/dL (ref 0.0–149.0)
VLDL: 22.2 mg/dL (ref 0.0–40.0)

## 2020-03-15 ENCOUNTER — Encounter (HOSPITAL_COMMUNITY): Payer: Self-pay | Admitting: *Deleted

## 2020-03-15 ENCOUNTER — Emergency Department (HOSPITAL_COMMUNITY): Payer: BC Managed Care – PPO

## 2020-03-15 ENCOUNTER — Emergency Department (HOSPITAL_COMMUNITY)
Admission: EM | Admit: 2020-03-15 | Discharge: 2020-03-15 | Disposition: A | Discharge status: Home / Self Care | Payer: BC Managed Care – PPO | Attending: Emergency Medicine | Admitting: Emergency Medicine

## 2020-03-15 DIAGNOSIS — K5900 Constipation, unspecified: Secondary | ICD-10-CM | POA: Diagnosis not present

## 2020-03-15 DIAGNOSIS — Z79899 Other long term (current) drug therapy: Secondary | ICD-10-CM | POA: Diagnosis not present

## 2020-03-15 DIAGNOSIS — R109 Unspecified abdominal pain: Secondary | ICD-10-CM | POA: Diagnosis not present

## 2020-03-15 DIAGNOSIS — Z950 Presence of cardiac pacemaker: Secondary | ICD-10-CM | POA: Diagnosis not present

## 2020-03-15 DIAGNOSIS — Z87891 Personal history of nicotine dependence: Secondary | ICD-10-CM | POA: Diagnosis not present

## 2020-03-15 LAB — COMPREHENSIVE METABOLIC PANEL
ALT: 31 U/L (ref 0–44)
AST: 27 U/L (ref 15–41)
Albumin: 4.1 g/dL (ref 3.5–5.0)
Alkaline Phosphatase: 64 U/L (ref 38–126)
Anion gap: 9 (ref 5–15)
BUN: 10 mg/dL (ref 6–20)
CO2: 26 mmol/L (ref 22–32)
Calcium: 9.6 mg/dL (ref 8.9–10.3)
Chloride: 102 mmol/L (ref 98–111)
Creatinine, Ser: 0.64 mg/dL (ref 0.44–1.00)
GFR calc Af Amer: >60 mL/min (ref >60)
GFR calc non Af Amer: >60 mL/min (ref >60)
Glucose, Bld: 99 mg/dL (ref 70–99)
Potassium: 3.9 mmol/L (ref 3.5–5.1)
Sodium: 137 mmol/L (ref 135–145)
Total Bilirubin: 0.3 mg/dL (ref 0.3–1.2)
Total Protein: 6.5 g/dL (ref 6.5–8.1)

## 2020-03-15 LAB — URINALYSIS, ROUTINE W REFLEX MICROSCOPIC
Bilirubin Urine: NEGATIVE
Glucose, UA: NEGATIVE mg/dL
Hgb urine dipstick: NEGATIVE
Ketones, ur: NEGATIVE mg/dL
Leukocytes,Ua: NEGATIVE
Nitrite: NEGATIVE
Protein, ur: NEGATIVE mg/dL
Specific Gravity, Urine: 1.001 — ABNORMAL LOW (ref 1.005–1.030)
pH: 7 (ref 5.0–8.0)

## 2020-03-15 LAB — CBC
HCT: 40.5 % (ref 36.0–46.0)
Hemoglobin: 13.7 g/dL (ref 12.0–15.0)
MCH: 32.2 pg (ref 26.0–34.0)
MCHC: 33.8 g/dL (ref 30.0–36.0)
MCV: 95.3 fL (ref 80.0–100.0)
Platelets: 204 10*3/uL (ref 150–400)
RBC: 4.25 MIL/uL (ref 3.87–5.11)
RDW: 13.1 % (ref 11.5–15.5)
WBC: 8 10*3/uL (ref 4.0–10.5)
nRBC: 0 % (ref 0.0–0.2)

## 2020-03-15 LAB — LIPASE, BLOOD: Lipase: 31 U/L (ref 11–51)

## 2020-03-15 LAB — POC OCCULT BLOOD, ED: Fecal Occult Bld: NEGATIVE

## 2020-03-15 MED ORDER — SODIUM CHLORIDE 0.9 % IV BOLUS
1000.0000 mL | Freq: Once | INTRAVENOUS | Status: AC
  Administered 2020-03-15: 1000 mL via INTRAVENOUS

## 2020-03-15 MED ORDER — ONDANSETRON HCL 4 MG/2ML IJ SOLN
4.0000 mg | Freq: Once | INTRAMUSCULAR | Status: AC
  Administered 2020-03-15: 4 mg via INTRAVENOUS
  Filled 2020-03-15: qty 2

## 2020-03-15 MED ORDER — IOHEXOL 300 MG/ML  SOLN
100.0000 mL | Freq: Once | INTRAMUSCULAR | Status: AC | PRN
  Administered 2020-03-15: 100 mL via INTRAVENOUS

## 2020-03-15 MED ORDER — SODIUM CHLORIDE 0.9% FLUSH
3.0000 mL | Freq: Once | INTRAVENOUS | Status: AC
  Administered 2020-03-15: 3 mL via INTRAVENOUS

## 2020-03-15 NOTE — ED Notes (Signed)
Patient verbalizes understanding of discharge instructions . Opportunity for questions and answers were provided . Armband removed by staff ,Pt discharged from ED. W/C  offered at D/C  and Declined W/C at D/C and was escorted to lobby by RN.  

## 2020-03-15 NOTE — ED Provider Notes (Signed)
[facility] EMERGENCY DEPARTMENT Provider Note   CSN: [id number] Arrival date & time: 03/15/20  8841     History Chief Complaint  Patient presents with  . Abdominal Pain    Dana Harvey is a 59 y.o. female.  The history is provided by the patient and medical records. No language interpreter was used.  Abdominal Pain    59 year old female with history of diverticular disease, GERD, and prior surgical history including appendectomy, cholecystectomy, pacemaker insertion presenting for evaluation of abdominal pain.  Patient report for the past 11 days she has not had a bowel movement.  She complains of tightness and burning sensation in her abdomen, having the urge to go with minimal stool production, decreased flatus, recurrent bouts of nausea, and has vomited up unprocessed food.  Symptoms moderate in severity.  She reported having history of gastroparesis in the past which felt somewhat similar.  She denies any prior history of SBO.  No fever chills no chest pain shortness of breath productive cough or dysuria.  She has not been COVID-19 vaccinated.  She did try an enema recently and did report having some liquid follows with some ability to pass flatus but symptoms still persist.  She report 8 pound weight gain within the past week.  Denies any history of cancer, no history of alcohol abuse or tobacco abuse.  Past Medical History:  Diagnosis Date  . Anxiety   . Chest pain at rest    normal cors on cath in 2012. Normal coronary CT in 2018. Normal stress test in 05/2019  . CVA (cerebral vascular accident) ([place]) 1997  . Depression   . Diverticulitis   . Dyspnea    previous PFT showed normal lung volumes, severe diffusion defect with DLCO 43% predicted  . Fainting spell   . Frequent headaches   . GERD (gastroesophageal reflux disease)   . Heart murmur   . Insomnia   . Migraines   . Pacemaker    St Jude dual chamber PPM placed on 09/06/2014 by Dr. Magdalene Molly  in [place]  . PAF (paroxysmal atrial fibrillation) ([place])    diagnosed by Dr. Magdalene Molly, remote history, no recurrent  . Polyp of intestine 2018   benign per patient  . Seizures ([place])    secondary to potassium level dropping (due to opioid medications per patient)  . Thyroid disease     Patient Active Problem List   Diagnosis Date Noted  . Pacemaker 04/01/2019  . Osteopenia 04/01/2019  . Anxiety 04/01/2019    Past Surgical History:  Procedure Laterality Date  . ABDOMINAL HYSTERECTOMY  1993   heavy vaginal bleeding; later went in and removed Right ovary  . APPENDECTOMY  1984  . ATRIAL ABLATION SURGERY  2012  . BREAST EXCISIONAL BIOPSY     bilateral; benign  . CARDIAC CATHETERIZATION  2012   normal cors on cath in [facility] tx  . CESAREAN SECTION     [phone]  . CHOLECYSTECTOMY  2010  . ELECTROPHYSIOLOGY STUDY  around 2013   with ablation for clinically suspected inappropriate sinus tachycardia, by Dr. Karleen Hampshire  . LOOP RECORDER IMPLANT  2013   by Dr. Karleen Hampshire  . PACEMAKER INSERTION  09/06/2014   St Jude Assurity dual chamber PPM, Dr Shanon Brow [place]--per patient; put in after ablation     OB History   No obstetric history on file.     Family History  Problem Relation Age of Onset  . Osteoarthritis Mother   .  Alcohol abuse Mother   . Arthritis Mother   . COPD Mother   . Depression Mother   . Hearing loss Mother   . Hyperlipidemia Mother   . Hypertension Mother   . Cancer Father   . Diabetes Father   . Heart disease Father 44       CABG  . Hypertension Father   . Alcohol abuse Brother   . Cancer Brother   . Depression Brother   . Drug abuse Brother   . Alcohol abuse Son   . Drug abuse Son   . Early death Maternal Grandmother 69  . Heart disease Maternal Grandmother   . Alcohol abuse Maternal Grandfather   . Heart disease Maternal Grandfather 70  . Cancer Paternal Grandmother   . Heart disease Paternal Grandmother   . Cancer Paternal Grandfather    . Heart disease Paternal Grandfather   . Alcohol abuse Brother   . Depression Brother   . Diabetes Brother   . Drug abuse Brother   . Hypertension Brother   . Hyperlipidemia Brother     Social History   Tobacco Use  . Smoking status: Former Games developer  . Smokeless tobacco: Never Used  . Tobacco comment: quit 37 years ago  Substance Use Topics  . Alcohol use: Never  . Drug use: Never    Home Medications Prior to Admission medications   Medication Sig Start Date End Date Taking? Authorizing Provider  ALPRAZolam (XANAX) 0.5 MG tablet TAKE 1 TABLET BY MOUTH 2 (TWO) TIMES DAILY AS NEEDED FOR ANXIETY. 02/23/20   Koberlein, Jannette Spanner C, MD  atorvastatin (LIPITOR) 80 MG tablet TAKE 1 TABLET BY MOUTH EVERY DAY 12/21/19   Koberlein, Junell C, MD  pantoprazole (PROTONIX) 40 MG tablet TAKE 1 TABLET BY MOUTH EVERY DAY 01/20/20   Koberlein, Jannette Spanner C, MD  zolpidem (AMBIEN CR) 6.25 MG CR tablet TAKE 1 TABLET BY MOUTH AT BEDTIME AS NEEDED FOR SLEEP. 02/23/20   [name], MD    Allergies    Penicillins, Aspirin, Crestor [rosuvastatin calcium], Fleet enema [enema], Miralax [polyethylene glycol], and Nystatin  Review of Systems   Review of Systems  Gastrointestinal: Positive for abdominal pain.  All other systems reviewed and are negative.   Physical Exam Updated Vital Signs BP (!) 133/91   Pulse 70   Temp 98.4 F (36.9 C) (Oral)   Resp 16   Ht 5\' 7"  (1.702 m)   Wt 59.9 kg   SpO2 100%   BMI 20.67 kg/m   Physical Exam Vitals and nursing note reviewed. Exam conducted with a chaperone present.  Constitutional:      General: She is not in acute distress.    Appearance: She is well-developed.  HENT:     Head: Atraumatic.  Eyes:     Conjunctiva/sclera: Conjunctivae normal.  Cardiovascular:     Rate and Rhythm: Normal rate and regular rhythm.  Pulmonary:     Effort: Pulmonary effort is normal.     Breath sounds: Normal breath sounds.  Abdominal:     General: Abdomen is flat.  Bowel sounds are decreased. There is no distension.     Palpations: Abdomen is soft.     Tenderness: There is abdominal tenderness in the left lower quadrant. Negative signs include Murphy's sign, Rovsing's sign and McBurney's sign.     Hernia: No hernia is present.  Genitourinary:    Comments: Chaperone present during exam.  Normal rectal tone, no obvious mass, no stool impaction, normal color stool on  glove.  No gross blood. Musculoskeletal:     Cervical back: Neck supple.  Skin:    Findings: No rash.  Neurological:     Mental Status: She is alert.     ED Results / Procedures / Treatments   Labs (all labs ordered are listed, but only abnormal results are displayed) Labs Reviewed  URINALYSIS, ROUTINE W REFLEX MICROSCOPIC - Abnormal; Notable for the following components:      Result Value   Color, Urine COLORLESS (*)    Specific Gravity, Urine 1.001 (*)    All other components within normal limits  LIPASE, BLOOD  COMPREHENSIVE METABOLIC PANEL  CBC  POC OCCULT BLOOD, ED    EKG None  Radiology CT ABDOMEN PELVIS W CONTRAST  Result Date: 03/15/2020 CLINICAL DATA:  Abdominal pain and constipation EXAM: CT ABDOMEN AND PELVIS WITH CONTRAST TECHNIQUE: Multidetector CT imaging of the abdomen and pelvis was performed using the standard protocol following bolus administration of intravenous contrast. CONTRAST:  OMNIPAQUE IOHEXOL 300 MG/ML  SOLN COMPARISON:  01/13/2020 FINDINGS: Lower chest: No acute abnormality. Hepatobiliary: No focal liver abnormality is seen. Status post cholecystectomy. No biliary dilatation. Pancreas: Unremarkable. No pancreatic ductal dilatation or surrounding inflammatory changes. Spleen: Normal in size without focal abnormality. Adrenals/Urinary Tract: Adrenal glands are within normal limits. Kidneys demonstrate a normal enhancement pattern bilaterally. No renal calculi are seen. Small right renal cyst is seen. No obstructive changes are noted. The bladder is  well distended. Stomach/Bowel: Appendix has been surgically removed. No obstructive or inflammatory changes of the colon are seen. Small bowel and stomach are unremarkable. Vascular/Lymphatic: Aortic atherosclerosis. No enlarged abdominal or pelvic lymph nodes. Reproductive: Status post hysterectomy. No adnexal masses. Other: No abdominal wall hernia or abnormality. No abdominopelvic ascites. Musculoskeletal: Degenerative changes of lumbar spine are noted. IMPRESSION: No acute abnormality to correspond with the patient's given clinical symptomatology. Electronically Signed   By: Alcide Clever M.D.   On: 03/15/2020 11:54    Procedures Procedures (including critical care time)  Medications Ordered in ED Medications  sodium chloride flush (NS) 0.9 % injection 3 mL (3 mLs Intravenous Given 03/15/20 1005)  ondansetron (ZOFRAN) injection 4 mg (4 mg Intravenous Given 03/15/20 1003)  sodium chloride 0.9 % bolus 1,000 mL (0 mLs Intravenous Stopped 03/15/20 1122)  iohexol (OMNIPAQUE) 300 MG/ML solution 100 mL (100 mLs Intravenous Contrast Given 03/15/20 1137)    ED Course  I have reviewed the triage vital signs and the nursing notes.  Pertinent labs & imaging results that were available during my care of the patient were reviewed by me and considered in my medical decision making (see chart for details).    MDM Rules/Calculators/A&P                      BP (!) 110/58 (BP Location: Right Arm)   Pulse 70   Temp 98.4 F (36.9 C) (Oral)   Resp 16   Ht 5\' 7"  (1.702 m)   Wt 59.9 kg   SpO2 100%   BMI 20.67 kg/m   Final Clinical Impression(s) / ED Diagnoses Final diagnoses:  Constipation, unspecified constipation type    Rx / DC Orders ED Discharge Orders    None     9:32 AM Patient with history of gastroparesis, and multiple prior abdominal laparoscopic surgery here with complaints of constipation for more than a week.  Will obtain abdominal pelvis CT scan for further evaluation and to rule  out SBO.  CT scan and  labs are reassuring.  encouarge pt to f/u with PCP for further management.  She should also f/u with her GI specialist as well.  Suspect gastroparesis contributing to her sxs.  Recommend miralax    Fayrene Helper, PA-C 03/16/20 1111    Gwyneth Sprout, MD 03/16/20 [phone]

## 2020-03-15 NOTE — Discharge Instructions (Signed)
Please call and follow-up with your gastroenterologist for further evaluation of your condition.  Unfortunately it appears that you are allergic to MiraLAX.  Drink plenty of fluid, use Fleet enema as needed for constipation.

## 2020-03-15 NOTE — ED Triage Notes (Signed)
To ED for eval of constipation for past 11 days. States she vomited this am and has picture of chicken that she ate on Saturday in her vomit. Pt used enema last night without relief. Hx of IBS.

## 2020-03-19 ENCOUNTER — Telehealth (INDEPENDENT_AMBULATORY_CARE_PROVIDER_SITE_OTHER): Payer: BC Managed Care – PPO | Admitting: Family Medicine

## 2020-03-19 ENCOUNTER — Encounter: Payer: Self-pay | Admitting: Family Medicine

## 2020-03-19 DIAGNOSIS — F419 Anxiety disorder, unspecified: Secondary | ICD-10-CM | POA: Diagnosis not present

## 2020-03-19 DIAGNOSIS — G4700 Insomnia, unspecified: Secondary | ICD-10-CM | POA: Diagnosis not present

## 2020-03-19 DIAGNOSIS — E785 Hyperlipidemia, unspecified: Secondary | ICD-10-CM

## 2020-03-19 DIAGNOSIS — K5900 Constipation, unspecified: Secondary | ICD-10-CM

## 2020-03-19 MED ORDER — SERTRALINE HCL 50 MG PO TABS: 50.0000 mg | ORAL_TABLET | Freq: Every day | ORAL | 1 refills | Status: DC

## 2020-03-19 NOTE — Progress Notes (Signed)
Virtual Visit via Video Note  I connected with Dana Harvey  on 03/19/20 at 12:45 PM EDT by a video enabled telemedicine application and verified that I am speaking with the correct person using two identifiers.  Location patient: home Location provider:work or home office Persons participating in the virtual visit: patient, provider  I discussed the limitations of evaluation and management by telemedicine and the availability of in person appointments. The patient expressed understanding and agreed to proceed.   Dana Harvey DOB: [birth year] Encounter date: 03/19/2020  This is a 59 y.o. female who presents with Chief Complaint  Patient presents with  . Follow-up    History of present illness: In ER 03/15/20 for constipation: Didn't have BM x 12 days. Was bloated, and had provider she works with listen and told her she didn't have bowel sounds. She kept burping chicken. Went to store and got enema and got nothing out but water. Then 40 minutes later started throwing up. No food was coming up, just bile. Couldn't sleep due to stomach pain all night. Thursday morning was throwing up all bile. Felt like something stuck in chest - she heimlich maneuver herself and chicken came up. Hands got swollen and she had gained 8lbs in 5 days.   Trying to avoid dairy, chicken. Took motegrity after 12 days no BM and miralax and had multiple days of bowel movements. Feels better today.   Has doubled up on the protonix to help with her stomach symptoms.   Tried to wean self off ambien. Then couldn't sleep so tried to take alprazolam instead (1.5 tab of the 0.5mg ). then tried not to take xanax for 4 days and was really stressed out to point of having to call the office. She went home and took half dose and felt ok.   Son and his wife are in rehab program. Sons wife delivered baby a couple of months ago and baby and all kids taken away.   Anxiety: has been increased; see above. Increases  through work day. Feels a lot of stress at work. Increased work load; feels responsible to taking on the work of others at the end of the day.   Ostopenia (08/19/18 last dexa):   Allergies  Allergen Reactions  . Penicillins Anaphylaxis  . Aspirin Tinitus  . Crestor [Rosuvastatin Calcium]   . Fleet Enema [Enema]     vomiting  . Miralax [Polyethylene Glycol]     vomiting  . Nystatin     Cough   Current Meds  Medication Sig  . ALPRAZolam (XANAX) 0.5 MG tablet TAKE 1 TABLET BY MOUTH 2 (TWO) TIMES DAILY AS NEEDED FOR ANXIETY.  Marland Kitchen atorvastatin (LIPITOR) 80 MG tablet Take 1 tablet (80 mg total) by mouth daily.  . pantoprazole (PROTONIX) 40 MG tablet Take 2 tablets (80 mg total) by mouth daily.  . Prucalopride Succinate (MOTEGRITY PO) Take by mouth as needed.  . zolpidem (AMBIEN CR) 6.25 MG CR tablet Take 1 tablet (6.25 mg total) by mouth at bedtime as needed. for sleep  . [DISCONTINUED] atorvastatin (LIPITOR) 80 MG tablet TAKE 1 TABLET BY MOUTH EVERY DAY  . [DISCONTINUED] pantoprazole (PROTONIX) 40 MG tablet TAKE 1 TABLET BY MOUTH EVERY DAY (Patient taking differently: 80 mg. )  . [DISCONTINUED] zolpidem (AMBIEN CR) 6.25 MG CR tablet TAKE 1 TABLET BY MOUTH AT BEDTIME AS NEEDED FOR SLEEP.    Review of Systems  Constitutional: Negative for chills, fatigue and fever.  Respiratory: Negative for cough, chest tightness, shortness  of breath and wheezing.   Cardiovascular: Negative for chest pain, palpitations and leg swelling.  Gastrointestinal: Positive for abdominal pain (has improved), constipation, diarrhea, nausea and vomiting.  Psychiatric/Behavioral: Positive for decreased concentration (when stressed). Negative for suicidal ideas. The patient is nervous/anxious.     Objective:  There were no vitals taken for this visit.      BP Readings from Last 3 Encounters:  03/15/20 (!) 110/58  10/12/19 102/68  09/27/19 (!) 147/84   Wt Readings from Last 3 Encounters:  03/15/20 132 lb  (59.9 kg)  06/20/19 125 lb 3.2 oz (56.8 kg)  06/02/19 123 lb 9.6 oz (56.1 kg)    EXAM:  GENERAL: alert, oriented, appears well and in no acute distress  HEENT: atraumatic, conjunctiva clear, no obvious abnormalities on inspection of external nose and ears  NECK: normal movements of the head and neck  LUNGS: on inspection no signs of respiratory distress, breathing rate appears normal, no obvious gross SOB, gasping or wheezing  CV: no obvious cyanosis  MS: moves all visible extremities without noticeable abnormality  PSYCH/NEURO: pleasant and cooperative. She sounds stressed, anxious. She has a good support system.    Assessment/Plan  1. Anxiety We are going to start sertraline today.  She will continue Xanax as needed, but I am hoping that with the sertraline in her system, anxiety will be better controlled overall. Discussed new medication(s) today with patient. Discussed potential side effects and patient verbalized understanding.  She is going to give me an update in 2 weeks with how she is tolerating medication and how mood is doing.  Mikeal Hawthorne to follow-up in 1 to 2 months.  A lot of anxiety is related to son's drug abuse, so she has mentioned going to an al-Anon meeting, which I encouraged her to do.  I encouraged her to bring her husband as well.   2. Constipation, unspecified constipation type She is continuing to use MiraLAX, and has had some relief with this.  I encouraged her to keep using this on a regular basis.  I am hoping with improvement in anxiety as well as use of the sertraline, the bowels will be more regular as well.  3. Insomnia, unspecified Continue with Ambien as needed.  This does help her with sleep. - zolpidem (AMBIEN CR) 6.25 MG CR tablet; Take 1 tablet (6.25 mg total) by mouth at bedtime as needed. for sleep  Dispense: 30 tablet; Refill: 2  4. Hyperlipidemia, unspecified hyperlipidemia type Continue with Lipitor.  Huge improvement in cholesterol with  this medication.  Labs reviewed with patient over the phone today.  Return for 2 week mychart update, we will schedule next visit pending this.    I discussed the assessment and treatment plan with the patient. The patient was provided an opportunity to ask questions and all were answered. The patient agreed with the plan and demonstrated an understanding of the instructions.   The patient was advised to call back or seek an in-person evaluation if the symptoms worsen or if the condition fails to improve as anticipated.  I provided 35 minutes of non-face-to-face time during this encounter.   Micheline Rough, MD

## 2020-03-22 DIAGNOSIS — E785 Hyperlipidemia, unspecified: Secondary | ICD-10-CM | POA: Insufficient documentation

## 2020-03-22 DIAGNOSIS — G4700 Insomnia, unspecified: Secondary | ICD-10-CM | POA: Insufficient documentation

## 2020-03-22 MED ORDER — ATORVASTATIN CALCIUM 80 MG PO TABS: 80.0000 mg | ORAL_TABLET | Freq: Every day | ORAL | 1 refills | Status: DC

## 2020-03-22 MED ORDER — ZOLPIDEM TARTRATE ER 6.25 MG PO TBCR: 6.2500 mg | EXTENDED_RELEASE_TABLET | Freq: Every evening | ORAL | 2 refills | Status: DC | PRN

## 2020-03-22 MED ORDER — PANTOPRAZOLE SODIUM 40 MG PO TBEC: 80.0000 mg | DELAYED_RELEASE_TABLET | Freq: Every day | ORAL | 1 refills | Status: DC

## 2020-04-06 ENCOUNTER — Ambulatory Visit (INDEPENDENT_AMBULATORY_CARE_PROVIDER_SITE_OTHER): Payer: BC Managed Care – PPO | Admitting: *Deleted

## 2020-04-06 DIAGNOSIS — I495 Sick sinus syndrome: Secondary | ICD-10-CM | POA: Diagnosis not present

## 2020-04-06 LAB — CUP PACEART REMOTE DEVICE CHECK
Battery Remaining Longevity: 114 mo
Battery Remaining Percentage: 95.5 %
Battery Voltage: 2.98 V
Brady Statistic AP VP Percent: 1 %
Brady Statistic AP VS Percent: 80 %
Brady Statistic AS VP Percent: 1 %
Brady Statistic AS VS Percent: 20 %
Brady Statistic RA Percent Paced: 77 %
Brady Statistic RV Percent Paced: 1 %
Date Time Interrogation Session: 20210611040014
Implantable Lead Implant Date: 20151111
Implantable Lead Implant Date: 20151111
Implantable Lead Location: 753859
Implantable Lead Location: 753860
Implantable Pulse Generator Implant Date: 20151111
Lead Channel Impedance Value: 460 Ohm
Lead Channel Impedance Value: 490 Ohm
Lead Channel Pacing Threshold Amplitude: 0.5 V
Lead Channel Pacing Threshold Amplitude: 0.5 V
Lead Channel Pacing Threshold Pulse Width: 0.4 ms
Lead Channel Pacing Threshold Pulse Width: 0.4 ms
Lead Channel Sensing Intrinsic Amplitude: 12 mV
Lead Channel Sensing Intrinsic Amplitude: 4 mV
Lead Channel Setting Pacing Amplitude: 2 V
Lead Channel Setting Pacing Amplitude: 2.5 V
Lead Channel Setting Pacing Pulse Width: 0.4 ms
Lead Channel Setting Sensing Sensitivity: 0.5 mV
Pulse Gen Model: 2240
Pulse Gen Serial Number: 7694052

## 2020-04-06 NOTE — Progress Notes (Signed)
Remote pacemaker transmission.   

## 2020-04-16 ENCOUNTER — Other Ambulatory Visit: Payer: Self-pay | Admitting: Family Medicine

## 2020-04-17 MED ORDER — ALPRAZOLAM 0.5 MG PO TABS: 0.5000 mg | ORAL_TABLET | Freq: Two times a day (BID) | ORAL | 1 refills | Status: DC | PRN

## 2020-05-07 ENCOUNTER — Ambulatory Visit (HOSPITAL_COMMUNITY)
Admission: EM | Admit: 2020-05-07 | Discharge: 2020-05-07 | Disposition: A | Discharge status: Home / Self Care | Payer: BC Managed Care – PPO | Attending: Family Medicine | Admitting: Family Medicine

## 2020-05-07 ENCOUNTER — Encounter (HOSPITAL_COMMUNITY): Payer: Self-pay | Admitting: Emergency Medicine

## 2020-05-07 ENCOUNTER — Other Ambulatory Visit: Payer: Self-pay

## 2020-05-07 DIAGNOSIS — H9209 Otalgia, unspecified ear: Secondary | ICD-10-CM | POA: Diagnosis not present

## 2020-05-07 DIAGNOSIS — I889 Nonspecific lymphadenitis, unspecified: Secondary | ICD-10-CM | POA: Diagnosis not present

## 2020-05-07 LAB — POCT RAPID STREP A: Streptococcus, Group A Screen (Direct): NEGATIVE

## 2020-05-07 MED ORDER — SULFAMETHOXAZOLE-TRIMETHOPRIM 800-160 MG PO TABS
1.0000 | ORAL_TABLET | Freq: Two times a day (BID) | ORAL | 0 refills | Status: AC
Start: 2020-05-07 — End: 2020-05-14

## 2020-05-07 NOTE — Discharge Instructions (Addendum)
Take the antibiotic as directed Warm compresses to area Tylenol or ibuprofen for pain Call or return for problems

## 2020-05-07 NOTE — ED Triage Notes (Addendum)
Complains of left ear pain and left tmj/jaw pain.  Patient has a dry cough  Symptoms started Saturday

## 2020-05-10 LAB — CULTURE, GROUP A STREP (THRC)

## 2020-05-10 NOTE — ED Provider Notes (Signed)
[facility]    CSN: [id number] Arrival date & time: 05/07/20  1727      History   Chief Complaint Chief Complaint  Patient presents with  . Otalgia    HPI Dana Harvey is a 59 y.o. female.   HPI   Patient has pain in the left ear and jaw for 4 days No pain with chewing No change in hearing No cold sx Pain with opening jaw Pain with extending neck/looking up   Past Medical History:  Diagnosis Date  . Anxiety   . Chest pain at rest    normal cors on cath in 2012. Normal coronary CT in 2018. Normal stress test in 05/2019  . CVA (cerebral vascular accident) (HCC) 1997  . Depression   . Diverticulitis   . Dyspnea    previous PFT showed normal lung volumes, severe diffusion defect with DLCO 43% predicted  . Fainting spell   . Frequent headaches   . GERD (gastroesophageal reflux disease)   . Heart murmur   . Insomnia   . Migraines   . Pacemaker    St Jude dual chamber PPM placed on 09/06/2014 by Dr. Gaynelle Adu in [place]  . PAF (paroxysmal atrial fibrillation) (HCC)    diagnosed by Dr. Gaynelle Adu, remote history, no recurrent  . Polyp of intestine 2018   benign per patient  . Seizures (HCC)    secondary to potassium level dropping (due to opioid medications per patient)  . Thyroid disease     Patient Active Problem List   Diagnosis Date Noted  . Insomnia 03/22/2020  . Hyperlipidemia 03/22/2020  . Pacemaker 04/01/2019  . Osteopenia 04/01/2019  . Anxiety 04/01/2019    Past Surgical History:  Procedure Laterality Date  . ABDOMINAL HYSTERECTOMY  1993   heavy vaginal bleeding; later went in and removed Right ovary  . APPENDECTOMY  1984  . ATRIAL ABLATION SURGERY  2012  . BREAST EXCISIONAL BIOPSY     bilateral; benign  . CARDIAC CATHETERIZATION  2012   normal cors on cath in [facility] tx  . CESAREAN SECTION     [phone]  . CHOLECYSTECTOMY  2010  . ELECTROPHYSIOLOGY STUDY  around 2013   with ablation for clinically suspected  inappropriate sinus tachycardia, by Dr. Annice Pih  . LOOP RECORDER IMPLANT  2013   by Dr. Annice Pih  . PACEMAKER INSERTION  09/06/2014   St Jude Assurity dual chamber PPM, Dr Onalee Hua McCain-[place]--per patient; put in after ablation    OB History   No obstetric history on file.      Home Medications    Prior to Admission medications   Medication Sig Start Date End Date Taking? Authorizing Provider  ALPRAZolam Prudy Feeler) 0.5 MG tablet Take 1 tablet (0.5 mg total) by mouth 2 (two) times daily as needed for anxiety. 04/17/20  Yes Koberlein, Junell C, MD  atorvastatin (LIPITOR) 80 MG tablet Take 1 tablet (80 mg total) by mouth daily. 03/22/20  Yes Koberlein, Junell C, MD  sertraline (ZOLOFT) 50 MG tablet Take 1 tablet (50 mg total) by mouth daily. 03/19/20  Yes Koberlein, Junell C, MD  zolpidem (AMBIEN CR) 6.25 MG CR tablet Take 1 tablet (6.25 mg total) by mouth at bedtime as needed. for sleep 03/22/20  Yes Koberlein, Junell C, MD  pantoprazole (PROTONIX) 40 MG tablet Take 2 tablets (80 mg total) by mouth daily. 03/22/20   [name], MD  Prucalopride Succinate (MOTEGRITY PO) Take by mouth as needed.  [provider]  sulfamethoxazole-trimethoprim (BACTRIM DS) 800-160 MG tablet Take 1 tablet by mouth 2 (two) times daily for 7 days. 05/07/20 05/14/20  Eustace Moore, MD    Family History Family History  Problem Relation Age of Onset  . Osteoarthritis Mother   . Alcohol abuse Mother   . Arthritis Mother   . COPD Mother   . Depression Mother   . Hearing loss Mother   . Hyperlipidemia Mother   . Hypertension Mother   . Cancer Father   . Diabetes Father   . Heart disease Father 37       CABG  . Hypertension Father   . Alcohol abuse Brother   . Cancer Brother   . Depression Brother   . Drug abuse Brother   . Alcohol abuse Son   . Drug abuse Son   . Early death Maternal Grandmother 58  . Heart disease Maternal Grandmother   . Alcohol abuse Maternal Grandfather   . Heart  disease Maternal Grandfather 70  . Cancer Paternal Grandmother   . Heart disease Paternal Grandmother   . Cancer Paternal Grandfather   . Heart disease Paternal Grandfather   . Alcohol abuse Brother   . Depression Brother   . Diabetes Brother   . Drug abuse Brother   . Hypertension Brother   . Hyperlipidemia Brother     Social History Social History   Tobacco Use  . Smoking status: Former Games developer  . Smokeless tobacco: Never Used  . Tobacco comment: quit 37 years ago  Substance Use Topics  . Alcohol use: Never  . Drug use: Never     Allergies   Nystatin, Penicillins, Aspirin, Crestor [rosuvastatin calcium], Fleet enema [enema], and Miralax [polyethylene glycol]   Review of Systems Review of Systems  See HPI Physical Exam Triage Vital Signs ED Triage Vitals  Enc Vitals Group     BP 05/07/20 1830 127/62     Pulse Rate 05/07/20 1830 73     Resp --      Temp 05/07/20 1830 98.8 F (37.1 C)     Temp Source 05/07/20 1830 Oral     SpO2 05/07/20 1830 100 %     Weight --      Height --      Head Circumference --      Peak Flow --      Pain Score 05/07/20 1825 8     Pain Loc --      Pain Edu? --      Excl. in GC? --    No data found.  Updated Vital Signs BP 127/62 (BP Location: Right Arm)   Pulse 73   Temp 98.8 F (37.1 C) (Oral)   SpO2 100%      Physical Exam Constitutional:      General: She is not in acute distress.    Appearance: Normal appearance. She is well-developed. She is not ill-appearing.  HENT:     Head: Normocephalic and atraumatic.      Right Ear: Tympanic membrane, ear canal and external ear normal.     Left Ear: Tympanic membrane, ear canal and external ear normal.     Ears:     Comments: No pain with traction pinna    Nose: Nose normal. No congestion.     Mouth/Throat:     Mouth: Mucous membranes are moist.     Pharynx: No posterior oropharyngeal erythema.  Eyes:     Conjunctiva/sclera: Conjunctivae normal.  Pupils: Pupils are  equal, round, and reactive to light.  Neck:     Comments: Tender ant cerv node at the left angle of the jaw Cardiovascular:     Rate and Rhythm: Normal rate.  Pulmonary:     Effort: Pulmonary effort is normal. No respiratory distress.  Abdominal:     General: There is no distension.     Palpations: Abdomen is soft.  Musculoskeletal:        General: Normal range of motion.     Cervical back: Normal range of motion.  Lymphadenopathy:     Cervical: Cervical adenopathy present.  Skin:    General: Skin is warm and dry.  Neurological:     Mental Status: She is alert.  Psychiatric:        Mood and Affect: Mood normal.        Behavior: Behavior normal.     Comments: Pleasant         UC Treatments / Results  Labs (all labs ordered are listed, but only abnormal results are displayed) Labs Reviewed  CULTURE, GROUP A STREP [facility])  POCT RAPID STREP A  strep (-)  EKG   Radiology No results found.  Procedures Procedures (including critical care time)  Medications Ordered in UC Medications - No data to display  Initial Impression / Assessment and Plan / UC Course  I have reviewed the triage vital signs and the nursing notes.  Pertinent labs & imaging results that were available during my care of the patient were reviewed by me and considered in my medical decision making (see chart for details).   No evidence of ear pathology, normal examination.  No nasal or posterior pharyngeal abnormalities to indicate ear pain.  Dentition is intact.  There is a swollen gland, and palpation of the gland does reproduce the pain she is exhibiting, and this is the area that hurts when she moves her neck and opens her mouth. Final Clinical Impressions(s) / UC Diagnoses   Final diagnoses:  Lymphadenitis     Discharge Instructions     Take the antibiotic as directed Warm compresses to area Tylenol or ibuprofen for pain Call or return for problems   ED Prescriptions    Medication Sig  Dispense Auth. Provider   sulfamethoxazole-trimethoprim (BACTRIM DS) 800-160 MG tablet Take 1 tablet by mouth 2 (two) times daily for 7 days. 14 tablet Eustace Moore, MD     PDMP not reviewed this encounter.   Eustace Moore, MD 05/10/20 [phone]

## 2020-05-30 ENCOUNTER — Encounter: Payer: Self-pay | Admitting: Family Medicine

## 2020-05-30 ENCOUNTER — Other Ambulatory Visit: Payer: Self-pay | Admitting: Family Medicine

## 2020-05-30 DIAGNOSIS — G4700 Insomnia, unspecified: Secondary | ICD-10-CM

## 2020-05-30 MED ORDER — ALPRAZOLAM 0.5 MG PO TABS: 0.5000 mg | ORAL_TABLET | Freq: Two times a day (BID) | ORAL | 1 refills | Status: DC | PRN

## 2020-05-30 MED ORDER — ZOLPIDEM TARTRATE ER 6.25 MG PO TBCR: 6.2500 mg | EXTENDED_RELEASE_TABLET | Freq: Every evening | ORAL | 2 refills | Status: DC | PRN

## 2020-06-13 ENCOUNTER — Other Ambulatory Visit: Payer: Self-pay | Admitting: Family Medicine

## 2020-06-13 DIAGNOSIS — Z1231 Encounter for screening mammogram for malignant neoplasm of breast: Secondary | ICD-10-CM

## 2020-07-03 ENCOUNTER — Telehealth: Payer: Self-pay | Admitting: Cardiology

## 2020-07-03 NOTE — Telephone Encounter (Signed)
Pt c/o of Chest Pain: STAT if CP now or developed within 24 hours  1. Are you having CP right now? No   2. Are you experiencing any other symptoms (ex. SOB, nausea, vomiting, sweating)? Fatigue, headaches   3. How long have you been experiencing CP? About 1.5 months   4. Is your CP continuous or coming and going? Coming and going   5. Have you taken Nitroglycerin? No ? Patient is requesting to make Dr. Jens Som aware of chest pain and she would also like to inquire about whether or not she is eligible to take Aspirin. Please advise.

## 2020-07-03 NOTE — Telephone Encounter (Signed)
Spoke to patient she stated for the past 1 to 1/2 months she has been having chest pain radiating up into jaw, down left arm and into back.Stated pain comes and goes.Stated she had a bad day yesterday stayed in bed most of day.Stated she had no energy.She also has had more episodes of Afib.She is presently at work and states she feels awful.She is presently having chest pain rates pain # 6.Advised she needs to go to ED to be evaluated.Stated she cannot go.Stated her manager will be upset.Strongly advised to go to ED.Appointment scheduled with Azalee Course PA 9/9 at 3:15pm.Advised patient again she needs to go to ED now.

## 2020-07-05 ENCOUNTER — Other Ambulatory Visit: Payer: Self-pay

## 2020-07-05 ENCOUNTER — Ambulatory Visit (INDEPENDENT_AMBULATORY_CARE_PROVIDER_SITE_OTHER): Payer: BC Managed Care – PPO | Admitting: Physician Assistant

## 2020-07-05 ENCOUNTER — Encounter: Payer: Self-pay | Admitting: Physician Assistant

## 2020-07-05 ENCOUNTER — Encounter: Payer: Self-pay | Admitting: Family Medicine

## 2020-07-05 VITALS — BP 118/70 | HR 75 | Ht 67.0 in | Wt 124.0 lb

## 2020-07-05 DIAGNOSIS — Z8673 Personal history of transient ischemic attack (TIA), and cerebral infarction without residual deficits: Secondary | ICD-10-CM

## 2020-07-05 DIAGNOSIS — Z950 Presence of cardiac pacemaker: Secondary | ICD-10-CM | POA: Diagnosis not present

## 2020-07-05 DIAGNOSIS — R0789 Other chest pain: Secondary | ICD-10-CM | POA: Diagnosis not present

## 2020-07-05 MED ORDER — ASPIRIN EC 81 MG PO TBEC
81.0000 mg | DELAYED_RELEASE_TABLET | Freq: Every day | ORAL | 3 refills | Status: DC
Start: 2020-07-05 — End: 2020-10-29

## 2020-07-05 NOTE — Patient Instructions (Signed)
Medication Instructions:   START Aspirin 81 mg daily *If you need a refill on your cardiac medications before your next appointment, please call your pharmacy*  Lab Work: NONE ordered at this time of appointment   If you have labs (blood work) drawn today and your tests are completely normal, you will receive your results only by: Marland Kitchen MyChart Message (if you have MyChart) OR . A paper copy in the mail If you have any lab test that is abnormal or we need to change your treatment, we will call you to review the results.  Testing/Procedures: NONE ordered at this time of appointment   Follow-Up: At [facility], you and your health needs are our priority.  As part of our continuing mission to provide you with exceptional heart care, we have created designated Provider Care Teams.  These Care Teams include your primary Cardiologist (physician) and Advanced Practice Providers (APPs -  Physician Assistants and Nurse Practitioners) who all work together to provide you with the care you need, when you need it.   Your next appointment:   6 month(s)  The format for your next appointment:   In Person  Provider:   Sherryl Manges, MD  Other Instructions

## 2020-07-05 NOTE — Progress Notes (Signed)
Cardiology Office Note:    Date:  07/07/2020   ID:  Dana Harvey, DOB 1961-08-30, MRN [id number]  PCP:  Wynn BankerKoberlein, Junell C, MD  [facility] Cardiologist:  Olga MillersBrian Crenshaw, MD  [facility] Electrophysiologist:  Sherryl MangesSteven Klein, MD   Referring MD: Wynn BankerKoberlein, Junell C, MD   Chief Complaint  Patient presents with  . Follow-up    seen for Dr. Jens Somrenshaw    History of Present Illness:    Dana Harvey is a 59 y.o. female with a hx of CVA, depression, GERD, insomnia, history of seizure in the setting of severe hypokalemia, and history of pacemaker.  Her father had first MI at age 58 and ended up having bypass surgery later.  Maternal grandmother had a sudden cardiac death at age 58.  She also had a witnessed syncopal episode in Dr. Hardie ShackletonManoj Panday's office with normal BP and heart rate during the event.  She ultimately underwent EPS.  Dr. Annice PihPanday reported no inducible sustained tachyarrhythmia.  Dr. Annice PihPanday then proceed with ablation for clinically suspected inappropriate sinus tachycardia despite top of the report read no finding of supraventricular tachycardia.  She had sinus tachycardia with heart rate of 130-140s on isoproterenol and epinephrine which was not surprising.  She subsequently developed sinus bradycardia.  She had a implantable loop recorder placed in early 2013.  Based on last report by Dr. Gaynelle AduMcCain, she was noted to have significant sinus bradycardia during sleep but occasional heart rate down into the 30s and 40s during the day as well on previous loop recorder interrogation.   She had [facility] cardiac [facility] in [facility] [place] which was reportedly normal. She later established seen by [place]. Sherre Scarletavid McCain [place] [facility] [place] [facility] [place] who diagnosed her with sick sinus syndrome, a St Jude dual-chamber pacemaker placed on 09/10/2014. (Assurity DR 2240 pacemaker [id number]) Records received from Dr. Neal Dyavid McCain's office indicated prior history of atrial fibrillation,  however no evidence of recurrence after placement of pacemaker.  [place] was later moved [place] [facility] and was followed by Dr. Fransisca ConnorsMichael Turner.  Echocardiogram obtained in July 2017 showed EF 45 to 50%, no pulmonary hypertension noted, normal valve function.  Sleep study obtained in August 2017 showed no evidence of nocturnal hypoxia.  Myoview obtained on 05/15/2016 was normal with fixed small anterior defect mixed with ischemia, however this was felt to be breast attenuation artifact. [facility] CT [facility] on 05/15/2017 revealed normal coronary arteries, EF 57%, no evidence of atherosclerosis.  St Jude pacemaker interrogation obtained in July 2018 obtained by Dr. Mayford Knifeurner revealed 72% atrial pacing, ventricular pacing less than 1%, no therapy was ever delivered.   She also had a pulmonary function test that revealed a normal spirometry, normal lung volumes, severe diffusion defect with DLCO 43% predicted.  [place] in February 2020 and has not established with a cardiologist since then.   I saw the patient for the first time in August 2020 for evaluation of palpitations and chest pain.  Echocardiogram obtained on 06/10/2019 showed EF 50 to 55%, no significant valve issue.  Myoview obtained on 06/10/2019 showed EF 39%, no ischemia or infarction.  Patient was established with Dr. Graciela HusbandsKlein as her electrophysiologist on 06/20/2019.  Dr. Graciela HusbandsKlein suspected patient has orthostatic hypotension which is likely contributing to her dyspnea and exercise intolerance.  Patient presents today with complaint of intermittent chest discomfort.  The symptom symptoms to be driven more by emotional stress and physical  stress.  Patient appears to be exhausted from the current work schedule.  She says she is constantly feeling down and fatigued.  She is doing more at her work due to multiple staff leaving.  This has placed a significant stress on her.  I suspect she also has some  degree of depression as well.  At first I recommended she discuss with a psychiatrist, however she does not wish to go to a psychiatrist at this time.  Given the fact that she had normal coronary CT in July 2018 and a normal Myoview last year.  At this time I do not recommend any further ischemic work-up.  I recommended continue observation.  I spoke with DOD Dr. Flora Lipps about this case who is also agreeable with the current plan.   Past Medical History:  Diagnosis Date  . Anxiety   . Chest pain at rest    normal cors on cath in 2012. Normal coronary CT in 2018. Normal stress test in 05/2019  . CVA (cerebral vascular accident) (HCC) 1997  . Depression   . Diverticulitis   . Dyspnea    previous PFT showed normal lung volumes, severe diffusion defect with DLCO 43% predicted  . Fainting spell   . Frequent headaches   . GERD (gastroesophageal reflux disease)   . Heart murmur   . Insomnia   . Migraines   . Pacemaker    St Jude dual chamber PPM placed on 09/06/2014 by Dr. Gaynelle Adu in [place]  . PAF (paroxysmal atrial fibrillation) (HCC)    diagnosed by Dr. Gaynelle Adu, remote history, no recurrent  . Polyp of intestine 2018   benign per patient  . Seizures (HCC)    secondary to potassium level dropping (due to opioid medications per patient)  . Thyroid disease     Past Surgical History:  Procedure Laterality Date  . ABDOMINAL HYSTERECTOMY  1993   heavy vaginal bleeding; later went in and removed Right ovary  . APPENDECTOMY  1984  . ATRIAL ABLATION SURGERY  2012  . BREAST EXCISIONAL BIOPSY     bilateral; benign  . CARDIAC CATHETERIZATION  2012   normal cors on cath in [facility] tx  . CESAREAN SECTION     [phone]  . CHOLECYSTECTOMY  2010  . ELECTROPHYSIOLOGY STUDY  around 2013   with ablation for clinically suspected inappropriate sinus tachycardia, by Dr. Annice Pih  . LOOP RECORDER IMPLANT  2013   by Dr. Annice Pih  . PACEMAKER INSERTION  09/06/2014   St Jude Assurity dual  chamber PPM, Dr Onalee Hua [place]--per patient; put in after ablation    Current Medications: Current Meds  Medication Sig  . ALPRAZolam (XANAX) 0.5 MG tablet Take 1 tablet (0.5 mg total) by mouth 2 (two) times daily as needed for anxiety.  Marland Kitchen atorvastatin (LIPITOR) 80 MG tablet Take 1 tablet (80 mg total) by mouth daily.  . pantoprazole (PROTONIX) 40 MG tablet Take 2 tablets (80 mg total) by mouth daily. (Patient taking differently: Take 80 mg by mouth as needed. )  . Prucalopride Succinate (MOTEGRITY PO) Take by mouth as needed.  . sertraline (ZOLOFT) 50 MG tablet Take 1 tablet (50 mg total) by mouth daily.  Marland Kitchen zolpidem (AMBIEN CR) 6.25 MG CR tablet Take 1 tablet (6.25 mg total) by mouth at bedtime as needed. for sleep     Allergies:   Nystatin, Penicillins, Aspirin, Crestor [rosuvastatin calcium], Fleet enema [enema], and Miralax [polyethylene glycol]   Social History   Socioeconomic History  .  Marital status: Married    Spouse name: Not on file  . Number of children: Not on file  . Years of education: Not on file  . Highest education level: Not on file  Occupational History  . Not on file  Tobacco Use  . Smoking status: Former Games developer  . Smokeless tobacco: Never Used  . Tobacco comment: quit 37 years ago  Substance and Sexual Activity  . Alcohol use: Never  . Drug use: Never  . Sexual activity: Not on file  Other Topics Concern  . Not on file  Social History Narrative  . Not on file   Social Determinants of Health   Financial Resource Strain:   . Difficulty of Paying Living Expenses: Not on file  Food Insecurity:   . Worried About Programme researcher, broadcasting/film/video in the Last Year: Not on file  . Ran Out of Food in the Last Year: Not on file  Transportation Needs:   . Lack of Transportation (Medical): Not on file  . Lack of Transportation (Non-Medical): Not on file  Physical Activity:   . Days of Exercise per Week: Not on file  . Minutes of Exercise per Session: Not on file    Stress:   . Feeling of Stress : Not on file  Social Connections:   . Frequency of Communication with Friends and Family: Not on file  . Frequency of Social Gatherings with Friends and Family: Not on file  . Attends Religious Services: Not on file  . Active Member of Clubs or Organizations: Not on file  . Attends Banker Meetings: Not on file  . Marital Status: Not on file     Family History: The patient's family history includes Alcohol abuse in her brother, brother, maternal grandfather, mother, and son; Arthritis in her mother; COPD in her mother; Cancer in her brother, father, paternal grandfather, and paternal grandmother; Depression in her brother, brother, and mother; Diabetes in her brother and father; Drug abuse in her brother, brother, and son; Early death (age of onset: 33) in her maternal grandmother; Hearing loss in her mother; Heart disease in her maternal grandmother, paternal grandfather, and paternal grandmother; Heart disease (age of onset: 25) in her father; Heart disease (age of onset: 48) in her maternal grandfather; Hyperlipidemia in her brother and mother; Hypertension in her brother, father, and mother; Osteoarthritis in her mother.  ROS:   Please see the history of present illness.     All other systems reviewed and are negative.  EKGs/Labs/Other Studies Reviewed:    The following studies were reviewed today:  Echo 06/10/2019  The left ventricular ejection fraction is normal (55-65%).  Nuclear stress EF: 59%.  There was no ST segment deviation noted during stress.  The study is normal.  This is a low risk study.   Normal resting and stress perfusion. No ischemia or infarction EF 59%    Myoview 06/10/2019  The left ventricular ejection fraction is normal (55-65%).  Nuclear stress EF: 59%.  There was no ST segment deviation noted during stress.  The study is normal.  This is a low risk study.   Normal resting and stress perfusion.  No ischemia or infarction EF 59%   EKG:  EKG is ordered today.  The ekg ordered today demonstrates atrial paced rhythm.  Recent Labs: 03/09/2020: TSH 0.61 03/15/2020: ALT 31; BUN 10; Creatinine, Ser 0.64; Hemoglobin 13.7; Platelets 204; Potassium 3.9; Sodium 137  Recent Lipid Panel    Component Value Date/Time  CHOL 185 03/09/2020 1316   TRIG 111.0 03/09/2020 1316   HDL 45.70 03/09/2020 1316   CHOLHDL 4 03/09/2020 1316   VLDL 22.2 03/09/2020 1316   LDLCALC 117 (H) 03/09/2020 1316    Physical Exam:    VS:  BP 118/70   Pulse 75   Ht 5\' 7"  (1.702 m)   Wt 124 lb (56.2 kg)   BMI 19.42 kg/m     Wt Readings from Last 3 Encounters:  07/05/20 124 lb (56.2 kg)  03/15/20 132 lb (59.9 kg)  06/20/19 125 lb 3.2 oz (56.8 kg)     GEN:  Well nourished, well developed in no acute distress HEENT: Normal NECK: No JVD; No carotid bruits LYMPHATICS: No lymphadenopathy CARDIAC: RRR, no murmurs, rubs, gallops RESPIRATORY:  Clear to auscultation without rales, wheezing or rhonchi  ABDOMEN: Soft, non-tender, non-distended MUSCULOSKELETAL:  No edema; No deformity  SKIN: Warm and dry NEUROLOGIC:  Alert and oriented x 3 PSYCHIATRIC:  Normal affect   ASSESSMENT:    1. Atypical chest pain   2. H/O: CVA (cerebrovascular accident)   3. Pacemaker    PLAN:    In order of problems listed above:  1. Atypical chest pain: Symptom occurs more so with emotional stress rather than with physical activity.  She had a normal coronary CT in 2018 and a normal Myoview last year.  At this time I do not recommend any further work-up.  I discussed with her regarding anxiety and depression and consideration to see a psychiatrist, she refused at this time.  2. History of CVA: No recent recurrence.  3. History of pacemaker: Followed by Dr. 2019   Medication Adjustments/Labs and Tests Ordered: Current medicines are reviewed at length with the patient today.  Concerns regarding medicines are outlined above.   Orders Placed This Encounter  Procedures  . EKG 12-Lead   Meds ordered this encounter  Medications  . aspirin EC 81 MG tablet    Sig: Take 1 tablet (81 mg total) by mouth daily. Swallow whole.    Dispense:  90 tablet    Refill:  3    Patient Instructions  Medication Instructions:   START Aspirin 81 mg daily *If you need a refill on your cardiac medications before your next appointment, please call your pharmacy*  Lab Work: NONE ordered at this time of appointment   If you have labs (blood work) drawn today and your tests are completely normal, you will receive your results only by: Graciela Husbands MyChart Message (if you have MyChart) OR . A paper copy in the mail If you have any lab test that is abnormal or we need to change your treatment, we will call you to review the results.  Testing/Procedures: NONE ordered at this time of appointment   Follow-Up: At [facility], you and your health needs are our priority.  As part of our continuing mission to provide you with exceptional heart care, we have created designated Provider Care Teams.  These Care Teams include your primary Cardiologist (physician) and Advanced Practice Providers (APPs -  Physician Assistants and Nurse Practitioners) who all work together to provide you with the care you need, when you need it.   Your next appointment:   6 month(s)  The format for your next appointment:   In Person  Provider:   [facility], MD  Other Instructions      Signed, Sherryl Manges, PA  07/07/2020 11:54 PM    [facility]

## 2020-07-06 ENCOUNTER — Ambulatory Visit (INDEPENDENT_AMBULATORY_CARE_PROVIDER_SITE_OTHER): Payer: BC Managed Care – PPO | Admitting: *Deleted

## 2020-07-06 DIAGNOSIS — I495 Sick sinus syndrome: Secondary | ICD-10-CM | POA: Diagnosis not present

## 2020-07-07 ENCOUNTER — Encounter: Payer: Self-pay | Admitting: Physician Assistant

## 2020-07-07 LAB — CUP PACEART REMOTE DEVICE CHECK
Battery Remaining Longevity: 113 mo
Battery Remaining Percentage: 95.5 %
Battery Voltage: 2.98 V
Brady Statistic AP VP Percent: 1 %
Brady Statistic AP VS Percent: 80 %
Brady Statistic AS VP Percent: 1 %
Brady Statistic AS VS Percent: 20 %
Brady Statistic RA Percent Paced: 77 %
Brady Statistic RV Percent Paced: 1 %
Date Time Interrogation Session: 20210910225344
Implantable Lead Implant Date: 20151111
Implantable Lead Implant Date: 20151111
Implantable Lead Location: 753859
Implantable Lead Location: 753860
Implantable Pulse Generator Implant Date: 20151111
Lead Channel Impedance Value: 440 Ohm
Lead Channel Impedance Value: 530 Ohm
Lead Channel Pacing Threshold Amplitude: 0.5 V
Lead Channel Pacing Threshold Amplitude: 0.5 V
Lead Channel Pacing Threshold Pulse Width: 0.4 ms
Lead Channel Pacing Threshold Pulse Width: 0.4 ms
Lead Channel Sensing Intrinsic Amplitude: 12 mV
Lead Channel Sensing Intrinsic Amplitude: 5 mV
Lead Channel Setting Pacing Amplitude: 2 V
Lead Channel Setting Pacing Amplitude: 2.5 V
Lead Channel Setting Pacing Pulse Width: 0.4 ms
Lead Channel Setting Sensing Sensitivity: 0.5 mV
Pulse Gen Model: 2240
Pulse Gen Serial Number: 7694052

## 2020-07-10 ENCOUNTER — Encounter: Payer: BC Managed Care – PPO | Admitting: Internal Medicine

## 2020-07-10 NOTE — Progress Notes (Signed)
Remote pacemaker transmission.   

## 2020-07-18 ENCOUNTER — Other Ambulatory Visit: Payer: Self-pay | Admitting: Family Medicine

## 2020-07-18 DIAGNOSIS — G4700 Insomnia, unspecified: Secondary | ICD-10-CM

## 2020-07-18 MED ORDER — ALPRAZOLAM 0.5 MG PO TABS: 0.5000 mg | ORAL_TABLET | Freq: Two times a day (BID) | ORAL | 2 refills | Status: DC | PRN

## 2020-07-18 MED ORDER — ZOLPIDEM TARTRATE ER 6.25 MG PO TBCR: 6.2500 mg | EXTENDED_RELEASE_TABLET | Freq: Every evening | ORAL | 2 refills | Status: DC | PRN

## 2020-07-18 NOTE — Telephone Encounter (Signed)
done

## 2020-07-26 ENCOUNTER — Encounter: Payer: Self-pay | Admitting: Family Medicine

## 2020-07-27 ENCOUNTER — Ambulatory Visit: Payer: BC Managed Care – PPO | Admitting: Medical

## 2020-07-27 MED ORDER — COLCHICINE 0.6 MG PO TABS: ORAL_TABLET | ORAL | 0 refills | Status: DC

## 2020-08-06 ENCOUNTER — Telehealth: Payer: Self-pay | Admitting: *Deleted

## 2020-08-06 ENCOUNTER — Telehealth (INDEPENDENT_AMBULATORY_CARE_PROVIDER_SITE_OTHER): Payer: BC Managed Care – PPO | Admitting: Family Medicine

## 2020-08-06 ENCOUNTER — Encounter: Payer: Self-pay | Admitting: Family Medicine

## 2020-08-06 VITALS — BP 101/66 | HR 70 | Temp 98.7°F | Wt 119.0 lb

## 2020-08-06 DIAGNOSIS — E559 Vitamin D deficiency, unspecified: Secondary | ICD-10-CM

## 2020-08-06 DIAGNOSIS — F419 Anxiety disorder, unspecified: Secondary | ICD-10-CM

## 2020-08-06 DIAGNOSIS — G4700 Insomnia, unspecified: Secondary | ICD-10-CM

## 2020-08-06 DIAGNOSIS — R5383 Other fatigue: Secondary | ICD-10-CM

## 2020-08-06 DIAGNOSIS — E785 Hyperlipidemia, unspecified: Secondary | ICD-10-CM

## 2020-08-06 DIAGNOSIS — E790 Hyperuricemia without signs of inflammatory arthritis and tophaceous disease: Secondary | ICD-10-CM

## 2020-08-06 NOTE — Progress Notes (Signed)
Virtual Visit via Video Note  I connected with Dana Harvey on 08/06/20 at 10:30 AM EDT by a video enabled telemedicine application and verified that I am speaking with the correct person using two identifiers.  Location patient: home Location provider: [facility]  [address] [place], [place] [address] Persons participating in the virtual visit: patient, provider  I discussed the limitations of evaluation and management by telemedicine and the availability of in person appointments. The patient expressed understanding and agreed to proceed.   Dana Harvey DOB: 01-14-1961 Encounter date: 08/06/2020  This is a 59 y.o. female who presents with follow up for stress, anxiety.    History of present illness: Felt like she was having heart attack every day; had EKG at ER and thought that sx were stress related. This was before she found out about her brother. Just can't even process her brother's death. Just can't breathe, can't sleep, losing weight because she can't eat. Can't even cry; just feels like she doesn't even know how to process this.   Doesn't know how to help her mom. Brother was stabbed to death and daughter found him. He passed on 26-Jul-2023 and found on Jul 27, 2023.   Son was in rehab and was doing well; got 2 month coin but now is back on street. Killer has been caught.   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 She has seen side effects on patients who have had vaccine and it worries her. Feels like she is being emotionally held hostage. Feels like she is not being valued.   Just worries about her body having a setback as she was so sick earlier this year.   Going to work does help her at least in terms of giving her mental distraction.   Can't fall asleep on own. When she gets home from work takes 1/2 alprazolam. Then around 8pm takes [place] 10mg  and 1/2 tablet of trazodone. Sleeps all night long; not waking  groggy. As soon as she wakes up, then stress starts right away again. Takes her 20-30 minutes praying to calm down stress/anxiety.   Friday when she left work; stomach was pretty upset. With increased stress stomach does get worse.   Allergies  Allergen Reactions   Nystatin Anaphylaxis   Penicillins Anaphylaxis   Aspirin Tinitus   Crestor [Rosuvastatin Calcium]    Fleet Enema [Enema]     vomiting   Miralax [Polyethylene Glycol]     Vomiting, patient states this causes a-fib and leads to syncope   Current Meds  Medication Sig   ALPRAZolam (XANAX) 0.5 MG tablet Take 1 tablet (0.5 mg total) by mouth 2 (two) times daily as needed for anxiety.   aspirin EC 81 MG tablet Take 1 tablet (81 mg total) by mouth daily. Swallow whole.   atorvastatin (LIPITOR) 80 MG tablet Take 1 tablet (80 mg total) by mouth daily.   colchicine 0.6 MG tablet Take 2 tablets PO x1, then 1 tablet PO 1 hour later   pantoprazole (PROTONIX) 40 MG tablet Take 2 tablets (80 mg total) by mouth daily. (Patient taking differently: Take 80 mg by mouth as needed. )   Prucalopride Succinate (MOTEGRITY PO) Take by mouth as needed.   sertraline (ZOLOFT) 50 MG tablet Take 1 tablet (50 mg total) by mouth daily.   traZODone (DESYREL) 100 MG tablet Take 100 mg by mouth at bedtime.   zolpidem (AMBIEN CR) 6.25 MG CR tablet Take  1 tablet (6.25 mg total) by mouth at bedtime as needed. for sleep    Review of Systems  Constitutional: Negative for chills, fatigue and fever.  Respiratory: Negative for cough, chest tightness, shortness of breath and wheezing.   Cardiovascular: Negative for chest pain, palpitations and leg swelling.  Psychiatric/Behavioral: Positive for sleep disturbance. Negative for suicidal ideas. The patient is nervous/anxious.     Objective:  BP 101/66    Pulse 70    Temp 98.7 F (37.1 C)    Wt 119 lb (54 kg)    BMI 18.64 kg/m   Weight: 119 lb (54 kg)   BP Readings from Last 3 Encounters:   08/06/20 101/66  07/05/20 118/70  05/07/20 127/62   Wt Readings from Last 3 Encounters:  08/06/20 119 lb (54 kg)  07/05/20 124 lb (56.2 kg)  03/15/20 132 lb (59.9 kg)    EXAM:  GENERAL: alert, oriented, appears well and in no acute distress  HEENT: atraumatic, conjunctiva clear, no obvious abnormalities on inspection of external nose and ears  NECK: normal movements of the head and neck  LUNGS: on inspection no signs of respiratory distress, breathing rate appears normal, no obvious gross SOB, gasping or wheezing  CV: no obvious cyanosis  MS: moves all visible extremities without noticeable abnormality  PSYCH/NEURO: pleasant and cooperative, no obvious depression or anxiety, speech and thought processing grossly intact. She is very troubled by work and vaccine conflict and family stressors/recent loss of brother. She is tearful at times, very worried.   Assessment/Plan  1. Insomnia, unspecified type She is doing ok with med management currently, but would certainly benefit from therapy which she is interested in to help sort through all of current/recent stressors.   2. Anxiety She has had trouble with overmedication in the past causing side effects, so we will start with therapy, but certainly consider increase in Zoloft if needed.  3. Fatigue, unspecified type - Comprehensive metabolic panel; Future - CBC with Differential/Platelet; Future - TSH; Future - Vitamin B12; Future - Iron, TIBC and Ferritin Panel; Future  4. Vitamin D deficiency - VITAMIN D 25 Hydroxy (Vit-D Deficiency, Fractures); Future   Return if symptoms worsen or fail to improve.  I discussed the assessment and treatment plan with the patient. The patient was provided an opportunity to ask questions and all were answered. The patient agreed with the plan and demonstrated an understanding of the instructions.   The patient was advised to call back or seek an in-person evaluation if the symptoms  worsen or if the condition fails to improve as anticipated.  I provided 30 minutes of non-face-to-face time during this encounter. She has many external stressors right now and I do think that most helpful to her would be therapy to help sort through stressors and to help with the stressors that she cannot control.  Theodis Shove, MD

## 2020-08-06 NOTE — Telephone Encounter (Signed)
-----   Message from [name], MD sent at 08/04/2020  5:24 PM EDT ----- I didn't get sent the message about adding her in on Friday until after 4 on Friday and past the time slot we were discussing putting her in at. Can you see where we can fit her in this week for a virtual visit? I am happy to add her on to end of my day on Monday if that works for her? I know she needs to be seen. Thanks!

## 2020-08-06 NOTE — Telephone Encounter (Signed)
noted 

## 2020-08-06 NOTE — Telephone Encounter (Signed)
Spoke with the pt and scheduled an appt for today.  Patient was advised to log in to the Mychart at 1pm (appt entered in the 10:30am slot).

## 2020-08-08 ENCOUNTER — Encounter: Payer: Self-pay | Admitting: Family Medicine

## 2020-08-08 ENCOUNTER — Telehealth: Payer: Self-pay | Admitting: *Deleted

## 2020-08-08 NOTE — Telephone Encounter (Signed)
-----   Message from [name], MD sent at 08/08/2020  7:28 AM EDT ----- Dana Harvey has been ordered; complete at convenience. I passed along her name to [facility], but if you want to make sure she has [facility] number to call, that would be great. Let me know if any worsening stress/anxiety in meanwhile.

## 2020-08-10 NOTE — Telephone Encounter (Signed)
Hi Dana Harvey- I have your labs all ordered and I'm sorry you didn't get contacted to get these set up this week. I will send another message back to have someone get in touch with you to set up for next week. Another option would be for me to reorder them to [facility] where you could just walk in and complete at your convenience.   I actually did not order the hiv/std screening, it is just one of those things that comes up as due for everyone in the chart. I can decline it so it doesn't pop up for you/alert you.   Did you hear from behavioral health yet? I did send a message to see if they would contact.   Glad you got your mammogram scheduled :) Theodis Shove, MD

## 2020-08-15 ENCOUNTER — Ambulatory Visit (INDEPENDENT_AMBULATORY_CARE_PROVIDER_SITE_OTHER): Payer: BC Managed Care – PPO | Admitting: Psychology

## 2020-08-15 ENCOUNTER — Other Ambulatory Visit: Payer: BC Managed Care – PPO

## 2020-08-15 ENCOUNTER — Other Ambulatory Visit: Payer: Self-pay

## 2020-08-15 DIAGNOSIS — E559 Vitamin D deficiency, unspecified: Secondary | ICD-10-CM

## 2020-08-15 DIAGNOSIS — F4321 Adjustment disorder with depressed mood: Secondary | ICD-10-CM | POA: Diagnosis not present

## 2020-08-15 DIAGNOSIS — R5383 Other fatigue: Secondary | ICD-10-CM

## 2020-08-15 DIAGNOSIS — E785 Hyperlipidemia, unspecified: Secondary | ICD-10-CM

## 2020-08-15 DIAGNOSIS — E790 Hyperuricemia without signs of inflammatory arthritis and tophaceous disease: Secondary | ICD-10-CM

## 2020-08-16 ENCOUNTER — Encounter: Payer: Self-pay | Admitting: Family Medicine

## 2020-08-16 ENCOUNTER — Ambulatory Visit
Admission: RE | Admit: 2020-08-16 | Discharge: 2020-08-16 | Disposition: A | Discharge status: Home / Self Care | Payer: BC Managed Care – PPO | Source: Ambulatory Visit

## 2020-08-16 ENCOUNTER — Other Ambulatory Visit: Payer: Self-pay | Admitting: Family Medicine

## 2020-08-16 DIAGNOSIS — D72829 Elevated white blood cell count, unspecified: Secondary | ICD-10-CM

## 2020-08-16 DIAGNOSIS — N63 Unspecified lump in unspecified breast: Secondary | ICD-10-CM

## 2020-08-16 DIAGNOSIS — Z1231 Encounter for screening mammogram for malignant neoplasm of breast: Secondary | ICD-10-CM

## 2020-08-16 LAB — VITAMIN B12: Vitamin B-12: 546 pg/mL (ref 200–1100)

## 2020-08-16 LAB — CBC WITH DIFFERENTIAL/PLATELET
Absolute Monocytes: 671 cells/uL (ref 200–950)
Basophils Absolute: 61 cells/uL (ref 0–200)
Basophils Relative: 0.5 %
Eosinophils Absolute: 24 cells/uL (ref 15–500)
Eosinophils Relative: 0.2 %
HCT: 40.2 % (ref 35.0–45.0)
Hemoglobin: 13.5 g/dL (ref 11.7–15.5)
Lymphs Abs: 2684 cells/uL (ref 850–3900)
MCH: 32.7 pg (ref 27.0–33.0)
MCHC: 33.6 g/dL (ref 32.0–36.0)
MCV: 97.3 fL (ref 80.0–100.0)
MPV: 10.7 fL (ref 7.5–12.5)
Monocytes Relative: 5.5 %
Neutro Abs: 8760 cells/uL — ABNORMAL HIGH (ref 1500–7800)
Neutrophils Relative %: 71.8 %
Platelets: 206 10*3/uL (ref 140–400)
RBC: 4.13 10*6/uL (ref 3.80–5.10)
RDW: 12.5 % (ref 11.0–15.0)
Total Lymphocyte: 22 %
WBC: 12.2 10*3/uL — ABNORMAL HIGH (ref 3.8–10.8)

## 2020-08-16 LAB — COMPLETE METABOLIC PANEL WITH GFR
AG Ratio: 2.1 (calc) (ref 1.0–2.5)
ALT: 12 U/L (ref 6–29)
AST: 14 U/L (ref 10–35)
Albumin: 4.4 g/dL (ref 3.6–5.1)
Alkaline phosphatase (APISO): 70 U/L (ref 37–153)
BUN: 14 mg/dL (ref 7–25)
CO2: 26 mmol/L (ref 20–32)
Calcium: 9.6 mg/dL (ref 8.6–10.4)
Chloride: 106 mmol/L (ref 98–110)
Creat: 0.69 mg/dL (ref 0.50–1.05)
GFR, Est African American: 111 mL/min/{1.73_m2} (ref >=60)
GFR, Est Non African American: 96 mL/min/{1.73_m2} (ref >=60)
Globulin: 2.1 g/dL (calc) (ref 1.9–3.7)
Glucose, Bld: 86 mg/dL (ref 65–99)
Potassium: 4 mmol/L (ref 3.5–5.3)
Sodium: 140 mmol/L (ref 135–146)
Total Bilirubin: 0.4 mg/dL (ref 0.2–1.2)
Total Protein: 6.5 g/dL (ref 6.1–8.1)

## 2020-08-16 LAB — IRON,TIBC AND FERRITIN PANEL
%SAT: 17 % (calc) (ref 16–45)
Ferritin: 40 ng/mL (ref 16–232)
Iron: 55 ug/dL (ref 45–160)
TIBC: 328 mcg/dL (calc) (ref 250–450)

## 2020-08-16 LAB — LIPID PANEL
Cholesterol: 182 mg/dL (ref <200)
HDL: 51 mg/dL (ref >=50)
LDL Cholesterol (Calc): 111 mg/dL (calc) — ABNORMAL HIGH
Non-HDL Cholesterol (Calc): 131 mg/dL (calc) — ABNORMAL HIGH (ref <130)
Total CHOL/HDL Ratio: 3.6 (calc) (ref <5.0)
Triglycerides: 98 mg/dL (ref <150)

## 2020-08-16 LAB — VITAMIN D 25 HYDROXY (VIT D DEFICIENCY, FRACTURES): Vit D, 25-Hydroxy: 25 ng/mL — ABNORMAL LOW (ref 30–100)

## 2020-08-16 LAB — TSH: TSH: 0.87 mIU/L (ref 0.40–4.50)

## 2020-08-16 LAB — URIC ACID: Uric Acid, Serum: 2.1 mg/dL — ABNORMAL LOW (ref 2.5–7.0)

## 2020-08-17 MED ORDER — ONDANSETRON HCL 4 MG PO TABS: 4.0000 mg | ORAL_TABLET | Freq: Three times a day (TID) | ORAL | 1 refills | Status: DC | PRN

## 2020-08-22 ENCOUNTER — Ambulatory Visit: Payer: BC Managed Care – PPO | Admitting: Psychology

## 2020-08-24 ENCOUNTER — Other Ambulatory Visit: Payer: Self-pay | Admitting: Family Medicine

## 2020-08-24 DIAGNOSIS — G4700 Insomnia, unspecified: Secondary | ICD-10-CM

## 2020-08-24 MED ORDER — SERTRALINE HCL 50 MG PO TABS
50.0000 mg | ORAL_TABLET | Freq: Every day | ORAL | 1 refills | Status: DC
Start: 2020-08-24 — End: 2021-02-26

## 2020-08-24 MED ORDER — PANTOPRAZOLE SODIUM 40 MG PO TBEC: 80.0000 mg | DELAYED_RELEASE_TABLET | Freq: Every day | ORAL | 1 refills | Status: AC

## 2020-08-24 MED ORDER — ALPRAZOLAM 0.5 MG PO TABS: 0.5000 mg | ORAL_TABLET | Freq: Two times a day (BID) | ORAL | 2 refills | Status: DC | PRN

## 2020-08-24 MED ORDER — ZOLPIDEM TARTRATE ER 6.25 MG PO TBCR: 6.2500 mg | EXTENDED_RELEASE_TABLET | Freq: Every evening | ORAL | 2 refills | Status: DC | PRN

## 2020-08-24 NOTE — Addendum Note (Signed)
Addended by: [name] on: 08/24/2020 05:12 PM   Modules accepted: Orders

## 2020-08-28 ENCOUNTER — Telehealth (INDEPENDENT_AMBULATORY_CARE_PROVIDER_SITE_OTHER): Payer: BC Managed Care – PPO | Admitting: Family Medicine

## 2020-08-28 ENCOUNTER — Other Ambulatory Visit: Payer: BC Managed Care – PPO

## 2020-08-28 DIAGNOSIS — H02843 Edema of right eye, unspecified eyelid: Secondary | ICD-10-CM | POA: Diagnosis not present

## 2020-08-28 MED ORDER — ERYTHROMYCIN 5 MG/GM OP OINT: 1.0000 "application " | TOPICAL_OINTMENT | Freq: Every day | OPHTHALMIC | 0 refills | Status: DC

## 2020-08-28 NOTE — Telephone Encounter (Signed)
Noted  

## 2020-08-28 NOTE — Progress Notes (Signed)
Virtual Visit via Video Note  I connected with   on 08/28/20 at  3:00 PM EDT by a video enabled telemedicine application and verified that I am speaking with the correct person using two identifiers.  Location patient: home Location provider:work or home office Persons participating in the virtual visit: patient, provider  I discussed the limitations of evaluation and management by telemedicine and the availability of in person appointments. The patient expressed understanding and agreed to proceed.   HPI:  Acute telemedicine visit for : -Onset: the last few days -Symptoms include: swelling bump on the upper eyelid, itchyness, feel irritated, somewhat tender - ? of one on the bottom lid as well -reports hx of stye and feels is the same -Denies: vision changes, drainage from the eye, fevers, malaise - but had a little muck in the eye  -Pertinent past medical history: ? Allergies, new to the area and has had some allergy issues at time, but mild -Pertinent medication allergies: see allergy list -reports mild trauma to R face last week, but felt it was doing fine and had evaluation with "doctors I work with" and was told was fine  ROS: See pertinent positives and negatives per HPI.  Past Medical History:  Diagnosis Date  . Anxiety   . Chest pain at rest    normal cors on cath in 2012. Normal coronary CT in 2018. Normal stress test in 05/2019  . CVA (cerebral vascular accident) (HCC) 1997  . Depression   . Diverticulitis   . Dyspnea    previous PFT showed normal lung volumes, severe diffusion defect with DLCO 43% predicted  . Fainting spell   . Frequent headaches   . GERD (gastroesophageal reflux disease)   . Heart murmur   . Insomnia   . Migraines   . Pacemaker    St Jude dual chamber PPM placed on 09/06/2014 by Dr. Gaynelle Adu in [place]  . PAF (paroxysmal atrial fibrillation) (HCC)    diagnosed by Dr. Gaynelle Adu, remote history, no recurrent  . Polyp of intestine 2018    benign per patient  . Seizures (HCC)    secondary to potassium level dropping (due to opioid medications per patient)  . Thyroid disease     Past Surgical History:  Procedure Laterality Date  . ABDOMINAL HYSTERECTOMY  1993   heavy vaginal bleeding; later went in and removed Right ovary  . APPENDECTOMY  1984  . ATRIAL ABLATION SURGERY  2012  . BREAST EXCISIONAL BIOPSY     bilateral; benign  . CARDIAC CATHETERIZATION  2012   normal cors on cath in [facility] tx  . CESAREAN SECTION     [phone]  . CHOLECYSTECTOMY  2010  . ELECTROPHYSIOLOGY STUDY  around 2013   with ablation for clinically suspected inappropriate sinus tachycardia, by Dr. Annice Pih  . LOOP RECORDER IMPLANT  2013   by Dr. Annice Pih  . PACEMAKER INSERTION  09/06/2014   St Jude Assurity dual chamber PPM, Dr Onalee Hua [place]--per patient; put in after ablation     Current Outpatient Medications:  .  ALPRAZolam (XANAX) 0.5 MG tablet, Take 1 tablet (0.5 mg total) by mouth 2 (two) times daily as needed for anxiety., Disp: 60 tablet, Rfl: 2 .  aspirin EC 81 MG tablet, Take 1 tablet (81 mg total) by mouth daily. Swallow whole., Disp: 90 tablet, Rfl: 3 .  atorvastatin (LIPITOR) 80 MG tablet, Take 1 tablet (80 mg total) by mouth daily., Disp: 90 tablet, Rfl: 1 .  colchicine  0.6 MG tablet, Take 2 tablets PO x1, then 1 tablet PO 1 hour later, Disp: 3 tablet, Rfl: 0 .  erythromycin ophthalmic ointment, Place 1 application into the right eye at bedtime. For 5 days, Disp: 3.5 g, Rfl: 0 .  ondansetron (ZOFRAN) 4 MG tablet, Take 1 tablet (4 mg total) by mouth every 8 (eight) hours as needed for nausea or vomiting., Disp: 30 tablet, Rfl: 1 .  pantoprazole (PROTONIX) 40 MG tablet, Take 2 tablets (80 mg total) by mouth daily., Disp: 180 tablet, Rfl: 1 .  Prucalopride Succinate (MOTEGRITY PO), Take by mouth as needed., Disp: , Rfl:  .  sertraline (ZOLOFT) 50 MG tablet, Take 1 tablet (50 mg total) by mouth daily., Disp: 90 tablet, Rfl:  1 .  traZODone (DESYREL) 100 MG tablet, Take 100 mg by mouth at bedtime., Disp: , Rfl:  .  zolpidem (AMBIEN CR) 6.25 MG CR tablet, Take 1 tablet (6.25 mg total) by mouth at bedtime as needed. for sleep, Disp: 30 tablet, Rfl: 2  EXAM:  VITALS per patient if applicable:  GENERAL: alert, oriented, appears well and in no acute distress  HEENT: atraumatic, conjunttiva clear, small mildly erythematous bump upper eyelid, video quality is not great but do not see swelling on the lower lid, she does appear to have some mild edema equal bilat below the eyes, no obvious abnormalities on inspection of external nose and ears  NECK: normal movements of the head and neck  LUNGS: on inspection no signs of respiratory distress, breathing rate appears normal, no obvious gross SOB, gasping or wheezing  CV: no obvious cyanosis  MS: moves all visible extremities without noticeable abnormality  PSYCH/NEURO: pleasant and cooperative, no obvious depression or anxiety, speech and thought processing grossly intact  ASSESSMENT AND PLAN:  Discussed the following assessment and plan:  Edema of right eyelid  -we discussed possible serious and likely etiologies, options for evaluation and workup, limitations of telemedicine visit vs in person visit, treatment, treatment risks and precautions. Pt prefers to treat via telemedicine empirically rather than in person at this moment.  The "bump" on the top eyelid does appear to be most consistent a stye.  Did advise an in person evaluation would be good given her recent trauma, however she feels that is not related and that this is a stye.  Opted for washcloth compresses twice daily, topical erythromycin ophthalmic ointment (Rx sent to pharmacy) and close monitoring.  Also advised an allergy pill such as Allegra or Zyrtec, as it seems she may have allergies. Advised to seek prompt in person care with her eye doctor, primary care doctor or an urgent care if worsening, new  symptoms arise, or if is not improving with treatment over the next 1 to 2 days. Discussed options for inperson care if PCP office not available. Did let this patient know that I only do telemedicine on Tuesdays and Thursdays for [place]. Advised to schedule follow up visit with PCP or [facility] if any further questions or concerns to avoid delays in care.   I discussed the assessment and treatment plan with the patient. The patient was provided an opportunity to ask questions and all were answered. The patient agreed with the plan and demonstrated an understanding of the instructions.     Terressa Koyanagi, DO

## 2020-08-28 NOTE — Patient Instructions (Signed)
-  I sent the medication(s) we discussed to your pharmacy: Meds ordered this encounter  Medications  . erythromycin ophthalmic ointment    Sig: Place 1 application into the right eye at bedtime. For 5 days    Dispense:  3.5 g    Refill:  0   Compresses twice daily.  I hope you are feeling better soon!  Seek in person care with your eye doctor, urgent care or your primary care doctor promptly if your symptoms worsen, new concerns arise or you are not improving with treatment over the next 1-2 days.  It was nice to meet you today. I help [facility] out with telemedicine visits on Tuesdays and Thursdays and am available for visits on those days. If you have any concerns or questions following this visit please schedule a follow up visit with your Primary Care doctor or seek care at a local urgent care clinic to avoid delays in care.

## 2020-10-05 ENCOUNTER — Ambulatory Visit (INDEPENDENT_AMBULATORY_CARE_PROVIDER_SITE_OTHER): Payer: BC Managed Care – PPO

## 2020-10-05 DIAGNOSIS — I495 Sick sinus syndrome: Secondary | ICD-10-CM | POA: Diagnosis not present

## 2020-10-08 LAB — CUP PACEART REMOTE DEVICE CHECK
Battery Remaining Longevity: 114 mo
Battery Remaining Percentage: 95.5 %
Battery Voltage: 2.98 V
Brady Statistic AP VP Percent: 1 %
Brady Statistic AP VS Percent: 80 %
Brady Statistic AS VP Percent: 1 %
Brady Statistic AS VS Percent: 19 %
Brady Statistic RA Percent Paced: 77 %
Brady Statistic RV Percent Paced: 1 %
Date Time Interrogation Session: 20211210040014
Implantable Lead Implant Date: 20151111
Implantable Lead Implant Date: 20151111
Implantable Lead Location: 753859
Implantable Lead Location: 753860
Implantable Pulse Generator Implant Date: 20151111
Lead Channel Impedance Value: 440 Ohm
Lead Channel Impedance Value: 540 Ohm
Lead Channel Pacing Threshold Amplitude: 0.5 V
Lead Channel Pacing Threshold Amplitude: 0.5 V
Lead Channel Pacing Threshold Pulse Width: 0.4 ms
Lead Channel Pacing Threshold Pulse Width: 0.4 ms
Lead Channel Sensing Intrinsic Amplitude: 10.9 mV
Lead Channel Sensing Intrinsic Amplitude: 4 mV
Lead Channel Setting Pacing Amplitude: 2 V
Lead Channel Setting Pacing Amplitude: 2.5 V
Lead Channel Setting Pacing Pulse Width: 0.4 ms
Lead Channel Setting Sensing Sensitivity: 0.5 mV
Pulse Gen Model: 2240
Pulse Gen Serial Number: 7694052

## 2020-10-09 ENCOUNTER — Encounter: Payer: Self-pay | Admitting: Family Medicine

## 2020-10-16 ENCOUNTER — Telehealth: Payer: Self-pay

## 2020-10-16 NOTE — Telephone Encounter (Signed)
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 States she had near syncopal event. Took her blood pressure last night, 98/57. Encouraged po fluids, no hx with fluid retention. Had experienced chest pain over the past few months, has been evaluated by cardiology and was told she was fine. Reports today she feels increased fatigue today. Advised I will forward to Dr. Graciela Husbands and we will call with changes. Advised to call if she does not feel well. ED precautions given.

## 2020-10-16 NOTE — Telephone Encounter (Signed)
Patient had question about alert and questions answered. Patient aware DR Graciela Husbands will evaluate message and we will call her back if Dr Graciela Husbands changes her treatment plan.

## 2020-10-16 NOTE — Telephone Encounter (Signed)
The pt called back to see what was the alert was. I let her speak with Cindy, rn.

## 2020-10-16 NOTE — Telephone Encounter (Signed)
[facility] alert received for 1 NSVT event logged, 34 seconds in duration. No BB on file. Called to assess. No answer, LMOVM.

## 2020-10-18 NOTE — Progress Notes (Signed)
Remote pacemaker transmission.   

## 2020-10-23 NOTE — Telephone Encounter (Signed)
Dana Harvey, if the times correlate between her spell and the VT - now sustained -- then we need to get her evaluated to see if her LV has changed Arlys John I am out waht is the best/most efficient way to get this lady seen Thanks SK

## 2020-10-24 NOTE — Telephone Encounter (Signed)
Schedule APPov Jaelin Fackler  

## 2020-10-24 NOTE — Telephone Encounter (Signed)
Spoke with pt, Follow up scheduled  

## 2020-10-29 ENCOUNTER — Other Ambulatory Visit: Payer: Self-pay

## 2020-10-29 ENCOUNTER — Encounter: Payer: Self-pay | Admitting: Cardiology

## 2020-10-29 ENCOUNTER — Ambulatory Visit (INDEPENDENT_AMBULATORY_CARE_PROVIDER_SITE_OTHER): Payer: BC Managed Care – PPO | Admitting: Cardiology

## 2020-10-29 VITALS — BP 90/54 | HR 70 | Ht 67.0 in | Wt 121.0 lb

## 2020-10-29 DIAGNOSIS — I495 Sick sinus syndrome: Secondary | ICD-10-CM | POA: Diagnosis not present

## 2020-10-29 DIAGNOSIS — R002 Palpitations: Secondary | ICD-10-CM | POA: Diagnosis not present

## 2020-10-29 DIAGNOSIS — I472 Ventricular tachycardia, unspecified: Secondary | ICD-10-CM

## 2020-10-29 DIAGNOSIS — R0789 Other chest pain: Secondary | ICD-10-CM

## 2020-10-29 DIAGNOSIS — Z950 Presence of cardiac pacemaker: Secondary | ICD-10-CM

## 2020-10-29 NOTE — Progress Notes (Signed)
HPI: Follow-up chest pain and previous pacemaker.  Patient has had previous ablation for inappropriate sinus tachycardia.  Apparently had cardiac catheterization in 2012 in [place] which was normal.  Had pacemaker placed for sick sinus syndrome November 2015.  Cardiac CT 2018 showed normal coronary arteries.  Echocardiogram August 2020 showed ejection fraction 50 to 55% and nuclear study showed ejection fraction 59% with no ischemia or infarction.  Has been seen by Dr. Graciela Husbands previously for orthostasis.  Seen with atypical chest pain in September.  Patient typically does not have significant dyspnea on exertion, orthopnea.  She does occasionally have mild pedal edema.  She has chest pain which has been longstanding.  It can last approximately 1 minute at a time.  It is not exertional.  She also has occasional heart palpitations.  On the 10th she was sitting on a stool and developed palpitations with mild nausea.  No chest pain or dyspnea.  She had near syncope.  She did not have incontinence.  Her device was interrogated and per notes she had 34 seconds of nonsustained ventricular tachycardia.  No recurrences since then.  Note she also has orthostatic symptoms.  Current Outpatient Medications  Medication Sig Dispense Refill  . ALPRAZolam (XANAX) 0.5 MG tablet Take 1 tablet (0.5 mg total) by mouth 2 (two) times daily as needed for anxiety. 60 tablet 2  . atorvastatin (LIPITOR) 80 MG tablet Take 1 tablet (80 mg total) by mouth daily. 90 tablet 1  . erythromycin ophthalmic ointment Place 1 application into the right eye at bedtime. For 5 days 3.5 g 0  . ondansetron (ZOFRAN) 4 MG tablet Take 1 tablet (4 mg total) by mouth every 8 (eight) hours as needed for nausea or vomiting. 30 tablet 1  . pantoprazole (PROTONIX) 40 MG tablet Take 2 tablets (80 mg total) by mouth daily. 180 tablet 1  . sertraline (ZOLOFT) 50 MG tablet Take 1 tablet (50 mg total) by mouth daily. 90 tablet 1  . traZODone (DESYREL) 100  MG tablet Take 100 mg by mouth at bedtime.    Marland Kitchen zolpidem (AMBIEN CR) 6.25 MG CR tablet Take 1 tablet (6.25 mg total) by mouth at bedtime as needed. for sleep 30 tablet 2   No current facility-administered medications for this visit.     Past Medical History:  Diagnosis Date  . Anxiety   . Chest pain at rest    normal cors on cath in 2012. Normal coronary CT in 2018. Normal stress test in 05/2019  . CVA (cerebral vascular accident) (HCC) 1997  . Depression   . Diverticulitis   . Dyspnea    previous PFT showed normal lung volumes, severe diffusion defect with DLCO 43% predicted  . Fainting spell   . Frequent headaches   . GERD (gastroesophageal reflux disease)   . Heart murmur   . Insomnia   . Migraines   . Pacemaker    St Jude dual chamber PPM placed on 09/06/2014 by Dr. Gaynelle Adu in [place]  . PAF (paroxysmal atrial fibrillation) (HCC)    diagnosed by Dr. Gaynelle Adu, remote history, no recurrent  . Polyp of intestine 2018   benign per patient  . Seizures (HCC)    secondary to potassium level dropping (due to opioid medications per patient)  . Thyroid disease     Past Surgical History:  Procedure Laterality Date  . ABDOMINAL HYSTERECTOMY  1993   heavy vaginal bleeding; later went in and removed Right ovary  .  APPENDECTOMY  1984  . ATRIAL ABLATION SURGERY  2012  . BREAST EXCISIONAL BIOPSY     bilateral; benign  . CARDIAC CATHETERIZATION  2012   normal cors on cath in [facility] tx  . CESAREAN SECTION     [phone]  . CHOLECYSTECTOMY  2010  . ELECTROPHYSIOLOGY STUDY  around 2013   with ablation for clinically suspected inappropriate sinus tachycardia, by Dr. Annice Pih  . LOOP RECORDER IMPLANT  2013   by Dr. Annice Pih  . PACEMAKER INSERTION  09/06/2014   St Jude Assurity dual chamber PPM, Dr Onalee Hua [place]--per patient; put in after ablation    Social History   Socioeconomic History  . Marital status: Married    Spouse name: Not on file  . Number of children:  Not on file  . Years of education: Not on file  . Highest education level: Not on file  Occupational History  . Not on file  Tobacco Use  . Smoking status: Former Games developer  . Smokeless tobacco: Never Used  . Tobacco comment: quit 37 years ago  Substance and Sexual Activity  . Alcohol use: Never  . Drug use: Never  . Sexual activity: Not on file  Other Topics Concern  . Not on file  Social History Narrative  . Not on file   Social Determinants of Health   Financial Resource Strain: Not on file  Food Insecurity: Not on file  Transportation Needs: Not on file  Physical Activity: Not on file  Stress: Not on file  Social Connections: Not on file  Intimate Partner Violence: Not on file    Family History  Problem Relation Age of Onset  . Osteoarthritis Mother   . Alcohol abuse Mother   . Arthritis Mother   . COPD Mother   . Depression Mother   . Hearing loss Mother   . Hyperlipidemia Mother   . Hypertension Mother   . Cancer Father   . Diabetes Father   . Heart disease Father 73       CABG  . Hypertension Father   . Alcohol abuse Brother   . Cancer Brother   . Depression Brother   . Drug abuse Brother   . Alcohol abuse Son   . Drug abuse Son   . Early death Maternal Grandmother 80  . Heart disease Maternal Grandmother   . Alcohol abuse Maternal Grandfather   . Heart disease Maternal Grandfather 70  . Cancer Paternal Grandmother   . Heart disease Paternal Grandmother   . Cancer Paternal Grandfather   . Heart disease Paternal Grandfather   . Alcohol abuse Brother   . Depression Brother   . Diabetes Brother   . Drug abuse Brother   . Hypertension Brother   . Hyperlipidemia Brother     ROS: Headaches and fatigue but no fevers or chills, productive cough, hemoptysis, dysphasia, odynophagia, melena, hematochezia, dysuria, hematuria, rash, seizure activity, orthopnea, PND, pedal edema, claudication. Remaining systems are negative.  Physical Exam: Well-developed  well-nourished in no acute distress.  Skin is warm and dry.  HEENT is normal.  Neck is supple.  Chest is clear to auscultation with normal expansion.  Cardiovascular exam is regular rate and rhythm.  Abdominal exam nontender or distended. No masses palpated. Extremities show no edema. neuro grossly intact  ECG-atrial pacing, normal axis, no ST changes.  Personally reviewed  A/P  1 episode of presyncope-patient had an episode of presyncope and based on interrogation of her device she had 34 seconds of nonsustained  ventricular tachycardia.  We will repeat echocardiogram to reassess LV function.  I am hesitant to add a beta-blocker as she has significant orthostatic symptoms which would likely be exacerbated.  I will have her follow-up with Dr. Caryl Comes for further recommendations.  2 previous pacemaker-Per electrophysiology.  3 history of chest pain-symptoms are atypical.  Electrocardiogram shows no ST changes.  Previous evaluation negative.  4 orthostatic hypotension-she describes symptoms of orthostasis.  We discussed the importance of hydration and sodium intake.  We also discussed the possibility of compression hose.  5 fatigue-we will check TSH and hemoglobin.  Kirk Ruths, MD

## 2020-10-29 NOTE — Patient Instructions (Signed)
Medication Instructions:   NO CHANGE  *If you need a refill on your cardiac medications before your next appointment, please call your pharmacy*   Lab Work:  Your physician recommends that you HAVE LAB WORK TODAY  If you have labs (blood work) drawn today and your tests are completely normal, you will receive your results only by: Marland Kitchen MyChart Message (if you have MyChart) OR . A paper copy in the mail If you have any lab test that is abnormal or we need to change your treatment, we will call you to review the results.   Testing/Procedures:  Your physician has requested that you have an echocardiogram. Echocardiography is a painless test that uses sound waves to create images of your heart. It provides your doctor with information about the size and shape of your heart and how well your heart's chambers and valves are working. This procedure takes approximately one hour. There are no restrictions for this procedure.[address]     Follow-Up: At [facility], you and your health needs are our priority.  As part of our continuing mission to provide you with exceptional heart care, we have created designated Provider Care Teams.  These Care Teams include your primary Cardiologist (physician) and Advanced Practice Providers (APPs -  Physician Assistants and Nurse Practitioners) who all work together to provide you with the care you need, when you need it.  We recommend signing up for the patient portal called "MyChart".  Sign up information is provided on this After Visit Summary.  MyChart is used to connect with patients for Virtual Visits (Telemedicine).  Patients are able to view lab/test results, encounter notes, upcoming appointments, etc.  Non-urgent messages can be sent to your provider as well.   To learn more about what you can do with MyChart, go to [email or web address].    Your next appointment:   4 month(s)  The format for your next appointment:   In  Person  Provider:   Olga Millers, MD   Other Instructions SCHEDULE FOLLOW UP WITH DR Graciela Husbands IN 2-4 WEEKS

## 2020-10-30 LAB — BASIC METABOLIC PANEL
BUN/Creatinine Ratio: 15 (ref 9–23)
BUN: 12 mg/dL (ref 6–24)
CO2: 23 mmol/L (ref 20–29)
Calcium: 9.3 mg/dL (ref 8.7–10.2)
Chloride: 109 mmol/L — ABNORMAL HIGH (ref 96–106)
Creatinine, Ser: 0.81 mg/dL (ref 0.57–1.00)
GFR calc Af Amer: 93 mL/min/{1.73_m2} (ref >59)
GFR calc non Af Amer: 80 mL/min/{1.73_m2} (ref >59)
Glucose: 99 mg/dL (ref 65–99)
Potassium: 4.4 mmol/L (ref 3.5–5.2)
Sodium: 144 mmol/L (ref 134–144)

## 2020-10-30 LAB — CBC
Hematocrit: 40.6 % (ref 34.0–46.6)
Hemoglobin: 13.7 g/dL (ref 11.1–15.9)
MCH: 32.5 pg (ref 26.6–33.0)
MCHC: 33.7 g/dL (ref 31.5–35.7)
MCV: 96 fL (ref 79–97)
Platelets: 202 10*3/uL (ref 150–450)
RBC: 4.21 x10E6/uL (ref 3.77–5.28)
RDW: 12.1 % (ref 11.7–15.4)
WBC: 9.6 10*3/uL (ref 3.4–10.8)

## 2020-10-31 ENCOUNTER — Encounter: Payer: Self-pay | Admitting: *Deleted

## 2020-11-15 ENCOUNTER — Ambulatory Visit: Payer: BC Managed Care – PPO | Admitting: Physician Assistant

## 2020-11-19 ENCOUNTER — Other Ambulatory Visit: Payer: Self-pay | Admitting: Cardiology

## 2020-11-19 ENCOUNTER — Encounter: Payer: Self-pay | Admitting: *Deleted

## 2020-11-19 ENCOUNTER — Telehealth (HOSPITAL_COMMUNITY): Payer: Self-pay | Admitting: Cardiology

## 2020-11-19 DIAGNOSIS — I472 Ventricular tachycardia, unspecified: Secondary | ICD-10-CM

## 2020-11-19 DIAGNOSIS — R002 Palpitations: Secondary | ICD-10-CM

## 2020-11-19 NOTE — Telephone Encounter (Signed)
Patient cancelled echocardiogram for reason below:  11/16/2020 3:28 PM  By:   Leotis Pain   Cancel Rsn: Patient (having done elsewhere due to cost kf)   Order will be removed from the WQ.

## 2020-11-20 ENCOUNTER — Other Ambulatory Visit (HOSPITAL_COMMUNITY): Payer: BC Managed Care – PPO

## 2020-11-21 ENCOUNTER — Other Ambulatory Visit: Payer: Self-pay

## 2020-11-21 ENCOUNTER — Ambulatory Visit: Payer: BC Managed Care – PPO

## 2020-11-21 DIAGNOSIS — I472 Ventricular tachycardia, unspecified: Secondary | ICD-10-CM

## 2020-11-21 DIAGNOSIS — R002 Palpitations: Secondary | ICD-10-CM

## 2020-11-22 ENCOUNTER — Telehealth (INDEPENDENT_AMBULATORY_CARE_PROVIDER_SITE_OTHER): Payer: BC Managed Care – PPO | Admitting: Family Medicine

## 2020-11-22 ENCOUNTER — Encounter: Payer: Self-pay | Admitting: Family Medicine

## 2020-11-22 DIAGNOSIS — U071 COVID-19: Secondary | ICD-10-CM

## 2020-11-22 MED ORDER — BENZONATATE 100 MG PO CAPS: 100.0000 mg | ORAL_CAPSULE | Freq: Three times a day (TID) | ORAL | 0 refills | Status: DC | PRN

## 2020-11-22 NOTE — Patient Instructions (Addendum)
   ---------------------------------------------------------------------------------------------------------------------------      WORK SLIP:  Patient Maleeah Crossman,  1961-01-31, was seen for a medical visit today, 11/22/20 . Please excuse from work for a COVID like illness. We advise 10 days minimum from the onset of symptoms (11/21/20) PLUS 1 day of no fever and improved symptoms. Will defer to employer for a sooner return to work if symptoms have resolved, it is greater than 5 days since the positive test and the patient can wear a high-quality, tight fitting mask such as N95 or KN95 at all times for an additional 5 days. Would also suggest COVID19 antigen testing is negative prior to return.  Sincerely: E-signature: Dr. Kriste Basque, DO [facility] Ph: [phone]   ------------------------------------------------------------------------------------------------------------------------------    HOME CARE TIPS:  -I sent the medication(s) we discussed to your pharmacy: Meds ordered this encounter  Medications  . benzonatate (TESSALON PERLES) 100 MG capsule    Sig: Take 1 capsule (100 mg total) by mouth 3 (three) times daily as needed.    Dispense:  20 capsule    Refill:  0     -can use tylenol or aleve if needed for fevers, aches and pains per instructions  -can use nasal saline a few times per day if you have nasal congestion; sometimes  a short course of Afrin nasal spray for 3 days can help with symptoms as well  -stay hydrated, drink plenty of fluids and eat small healthy meals - avoid dairy  -can take 1000 IU ( ) Vit D3 and 100-500 mg of Vit C daily per instructions  -follow up with your doctor in 2-3 days unless improving and feeling better  -stay home while sick, except to seek medical care, and if you have COVID19 ideally it would be best to stay home for a full 10 days since the onset of symptoms PLUS one day of no fever and feeling  better. Wear a good mask (such as N95 or KN95) if around others to reduce the risk of transmission.  It was nice to meet you today, and I really hope you are feeling better soon. I help [facility] out with telemedicine visits on Tuesdays and Thursdays and am available for visits on those days. If you have any concerns or questions following this visit please schedule a follow up visit with your Primary Care doctor or seek care at a local urgent care clinic to avoid delays in care.    Seek in person care or schedule a follow up video visit promptly if your symptoms worsen, new concerns arise or you are not improving with treatment. Call 911 and/or seek emergency care if your symptoms are severe or life threatening.

## 2020-11-22 NOTE — Progress Notes (Signed)
Virtual Visit via Video Note  I connected with Dana Harvey  on 11/22/20 at  4:40 PM EST by a video enabled telemedicine application and verified that I am speaking with the correct person using two identifiers.  Location patient: home, [place] Location provider:work or home office Persons participating in the virtual visit: patient, provider  I discussed the limitations of evaluation and management by telemedicine and the availability of in person appointments. The patient expressed understanding and agreed to proceed.   HPI:  Acute telemedicine visit for COVID19: -Onset: yesterday; positive covid test today -Symptoms include: nasal congestion, fevers, chills, cough, headaches, body aches -Denies:CP, SOB, NVD, inability to eat/drink/get out of bed -Has tried:Excedrin, tylenol, aleve, vit C, vit D, zinc  -Pertinent past medical history: pacemaker for heart, hx of tachy, depression -Pertinent medication allergies: nystatin, penicillin, asa, crestor -COVID-19 vaccine status: not vaccinated  ROS: See pertinent positives and negatives per HPI.  Past Medical History:  Diagnosis Date  . Anxiety   . Chest pain at rest    normal cors on cath in 2012. Normal coronary CT in 2018. Normal stress test in 05/2019  . CVA (cerebral vascular accident) (HCC) 1997  . Depression   . Diverticulitis   . Dyspnea    previous PFT showed normal lung volumes, severe diffusion defect with DLCO 43% predicted  . Fainting spell   . Frequent headaches   . GERD (gastroesophageal reflux disease)   . Heart murmur   . Insomnia   . Migraines   . Pacemaker    St Jude dual chamber PPM placed on 09/06/2014 by Dr. Gaynelle Adu in [place]  . PAF (paroxysmal atrial fibrillation) (HCC)    diagnosed by Dr. Gaynelle Adu, remote history, no recurrent  . Polyp of intestine 2018   benign per patient  . Seizures (HCC)    secondary to potassium level dropping (due to opioid medications per patient)  . Thyroid disease     Past  Surgical History:  Procedure Laterality Date  . ABDOMINAL HYSTERECTOMY  1993   heavy vaginal bleeding; later went in and removed Right ovary  . APPENDECTOMY  1984  . ATRIAL ABLATION SURGERY  2012  . BREAST EXCISIONAL BIOPSY     bilateral; benign  . CARDIAC CATHETERIZATION  2012   normal cors on cath in [facility] tx  . CESAREAN SECTION     [phone]  . CHOLECYSTECTOMY  2010  . ELECTROPHYSIOLOGY STUDY  around 2013   with ablation for clinically suspected inappropriate sinus tachycardia, by Dr. Annice Pih  . LOOP RECORDER IMPLANT  2013   by Dr. Annice Pih  . PACEMAKER INSERTION  09/06/2014   St Jude Assurity dual chamber PPM, Dr Onalee Hua [place]--per patient; put in after ablation     Current Outpatient Medications:  .  benzonatate (TESSALON PERLES) 100 MG capsule, Take 1 capsule (100 mg total) by mouth 3 (three) times daily as needed., Disp: 20 capsule, Rfl: 0 .  ALPRAZolam (XANAX) 0.5 MG tablet, Take 1 tablet (0.5 mg total) by mouth 2 (two) times daily as needed for anxiety., Disp: 60 tablet, Rfl: 2 .  atorvastatin (LIPITOR) 80 MG tablet, Take 1 tablet (80 mg total) by mouth daily., Disp: 90 tablet, Rfl: 1 .  erythromycin ophthalmic ointment, Place 1 application into the right eye at bedtime. For 5 days, Disp: 3.5 g, Rfl: 0 .  ondansetron (ZOFRAN) 4 MG tablet, Take 1 tablet (4 mg total) by mouth every 8 (eight) hours as needed for nausea or vomiting., Disp: 30  tablet, Rfl: 1 .  pantoprazole (PROTONIX) 40 MG tablet, Take 2 tablets (80 mg total) by mouth daily., Disp: 180 tablet, Rfl: 1 .  sertraline (ZOLOFT) 50 MG tablet, Take 1 tablet (50 mg total) by mouth daily., Disp: 90 tablet, Rfl: 1 .  traZODone (DESYREL) 100 MG tablet, Take 100 mg by mouth at bedtime., Disp: , Rfl:  .  zolpidem (AMBIEN CR) 6.25 MG CR tablet, Take 1 tablet (6.25 mg total) by mouth at bedtime as needed. for sleep, Disp: 30 tablet, Rfl: 2  EXAM:  VITALS per patient if applicable:  GENERAL: alert, oriented,  appears well and in no acute distress  HEENT: atraumatic, conjunttiva clear, no obvious abnormalities on inspection of external nose and ears  NECK: normal movements of the head and neck  LUNGS: on inspection no signs of respiratory distress, breathing rate appears normal, no obvious gross SOB, gasping or wheezing  CV: no obvious cyanosis  MS: moves all visible extremities without noticeable abnormality  PSYCH/NEURO: pleasant and cooperative, no obvious depression or anxiety, speech and thought processing grossly intact  ASSESSMENT AND PLAN:  Discussed the following assessment and plan:  COVID-19  -we discussed possible serious and likely etiologies, options for evaluation and workup, limitations of telemedicine visit vs in person visit, treatment, treatment risks and precautions. Pt prefers to treat via telemedicine empirically rather than in person at this moment.  Discussed treatment options for new Covid surge, treatment shortages, potential complications, isolation and precautions.  Offered a referral for treatment, declined.  Discussed symptomatic home care measures summarized in patient instructions.  Sent Tessalon for cough.  Work/School slipped offered: provided in patient instructions  Advised to seek prompt follow-up in person care if worsening, new symptoms arise, or if is not improving with treatment. Discussed options for inperson care if PCP office not available. Did let this patient know that I only do telemedicine on Tuesdays and Thursdays for [place]. Advised to schedule follow up visit with PCP or [facility] if any further questions or concerns to avoid delays in care.   I discussed the assessment and treatment plan with the patient. The patient was provided an opportunity to ask questions and all were answered. The patient agreed with the plan and demonstrated an understanding of the instructions.     Terressa Koyanagi, DO

## 2020-11-23 ENCOUNTER — Encounter: Payer: Self-pay | Admitting: Family Medicine

## 2020-11-25 DIAGNOSIS — I951 Orthostatic hypotension: Secondary | ICD-10-CM | POA: Insufficient documentation

## 2020-11-25 DIAGNOSIS — R011 Cardiac murmur, unspecified: Secondary | ICD-10-CM | POA: Insufficient documentation

## 2020-11-25 DIAGNOSIS — I495 Sick sinus syndrome: Secondary | ICD-10-CM | POA: Insufficient documentation

## 2020-11-29 ENCOUNTER — Other Ambulatory Visit: Payer: Self-pay

## 2020-11-29 ENCOUNTER — Telehealth (INDEPENDENT_AMBULATORY_CARE_PROVIDER_SITE_OTHER): Payer: BC Managed Care – PPO | Admitting: Internal Medicine

## 2020-11-29 VITALS — BP 108/58 | HR 70 | Wt 118.0 lb

## 2020-11-29 DIAGNOSIS — I951 Orthostatic hypotension: Secondary | ICD-10-CM | POA: Diagnosis not present

## 2020-11-29 DIAGNOSIS — Z950 Presence of cardiac pacemaker: Secondary | ICD-10-CM | POA: Diagnosis not present

## 2020-11-29 DIAGNOSIS — I495 Sick sinus syndrome: Secondary | ICD-10-CM

## 2020-11-29 NOTE — Patient Instructions (Signed)
Medication Instructions:  Your physician recommends that you continue on your current medications as directed. Please refer to the Current Medication list given to you today.  *If you need a refill on your cardiac medications before your next appointment, please call your pharmacy*   Lab Work: None ordered.  If you have labs (blood work) drawn today and your tests are completely normal, you will receive your results only by: Marland Kitchen MyChart Message (if you have MyChart) OR . A paper copy in the mail If you have any lab test that is abnormal or we need to change your treatment, we will call you to review the results.   Testing/Procedures: None ordered.    Follow-Up: At [facility], you and your health needs are our priority.  As part of our continuing mission to provide you with exceptional heart care, we have created designated Provider Care Teams.  These Care Teams include your primary Cardiologist (physician) and Advanced Practice Providers (APPs -  Physician Assistants and Nurse Practitioners) who all work together to provide you with the care you need, when you need it.  We recommend signing up for the patient portal called "MyChart".  Sign up information is provided on this After Visit Summary.  MyChart is used to connect with patients for Virtual Visits (Telemedicine).  Patients are able to view lab/test results, encounter notes, upcoming appointments, etc.  Non-urgent messages can be sent to your provider as well.   To learn more about what you can do with MyChart, go to [email or web address].    Your next appointment:   5 month(s)  The format for your next appointment:   In Person  Provider:   Dr Graciela Husbands   Other Instructions [facility] as discussed by Dr Graciela Husbands

## 2020-11-29 NOTE — Progress Notes (Signed)
Electrophysiology TeleHealth Note   Due to national recommendations of social distancing due to COVID 19, an audio/video telehealth visit is felt to be most appropriate for this patient at this time.  See MyChart message from today for the patient's consent to telehealth for [name].   Date:  11/29/2020   ID:  Dana Harvey, DOB August 21, 1961, MRN [id number]  Location: patient's home  Provider location: [address] [place]  Evaluation Performed: Follow-up visit  PCP:  [name], MD  Cardiologist:    Electrophysiologist:  SK   Chief Complaint:  palps  History of Present Illness:    Dana Harvey is a 60 y.o. female who presents via audio/video conferencing for a telehealth visit today.  Since last being seen in our  pacemaker St Jude 2015 followup implanted elsewhere following a with dx of  chronic fatigue and fibromyalgia and saw many MDs finally w  Dx with inappropriate sinus tachycardia and underwent ablation with significant improvement  Recurrent symptomts and loop showed >> tachy and "nocturnal bradycardia" and paced with resolution of almost all of her symptoms.  She understood it was to "fix her Afib" the patient reports intercurrent (COVID I think) infection that necessitated her having to be quarantined for a period  And she says she has not been allowed to work from home  She has had more chest pain and palpitations  She is not sure If related to stress and anxiety but suspects so-- her daugher, Herbert Seta , is stationed in [place] and her GK are in [place]        DATE TEST EF   8/20 .myoview  59 % No ischemia  8/20 Echo  50-55%             The patient denies symptoms of fevers, chills, cough, or new SOB worrisome for COVID 19.    Past Medical History:  Diagnosis Date  . Anxiety   . Chest pain at rest    normal cors on cath in 2012. Normal coronary CT in 2018. Normal stress test in 05/2019  . CVA (cerebral  vascular accident) (HCC) 1997  . Depression   . Diverticulitis   . Dyspnea    previous PFT showed normal lung volumes, severe diffusion defect with DLCO 43% predicted  . Fainting spell   . Frequent headaches   . GERD (gastroesophageal reflux disease)   . Heart murmur   . Insomnia   . Migraines   . Pacemaker    St Jude dual chamber PPM placed on 09/06/2014 by Dr. Gaynelle Adu in [place]  . PAF (paroxysmal atrial fibrillation) (HCC)    diagnosed by Dr. Gaynelle Adu, remote history, no recurrent  . Polyp of intestine 2018   benign per patient  . Seizures (HCC)    secondary to potassium level dropping (due to opioid medications per patient)  . Thyroid disease     Past Surgical History:  Procedure Laterality Date  . ABDOMINAL HYSTERECTOMY  1993   heavy vaginal bleeding; later went in and removed Right ovary  . APPENDECTOMY  1984  . ATRIAL ABLATION SURGERY  2012  . BREAST EXCISIONAL BIOPSY     bilateral; benign  . CARDIAC CATHETERIZATION  2012   normal cors on cath in [facility] tx  . CESAREAN SECTION     [phone]  . CHOLECYSTECTOMY  2010  . ELECTROPHYSIOLOGY STUDY  around 2013   with ablation for clinically suspected inappropriate sinus tachycardia, by Dr.  Panday  . LOOP RECORDER IMPLANT  2013   by Dr. Annice Pih  . PACEMAKER INSERTION  09/06/2014   St Jude Assurity dual chamber PPM, Dr Onalee Hua [place]--per patient; put in after ablation    Current Outpatient Medications  Medication Sig Dispense Refill  . ALPRAZolam (XANAX) 0.5 MG tablet Take 1 tablet (0.5 mg total) by mouth 2 (two) times daily as needed for anxiety. 60 tablet 2  . atorvastatin (LIPITOR) 80 MG tablet Take 1 tablet (80 mg total) by mouth daily. 90 tablet 1  . benzonatate (TESSALON PERLES) 100 MG capsule Take 1 capsule (100 mg total) by mouth 3 (three) times daily as needed. 20 capsule 0  . erythromycin ophthalmic ointment Place 1 application into the right eye at bedtime. For 5 days 3.5 g 0  . ondansetron  (ZOFRAN) 4 MG tablet Take 1 tablet (4 mg total) by mouth every 8 (eight) hours as needed for nausea or vomiting. 30 tablet 1  . pantoprazole (PROTONIX) 40 MG tablet Take 2 tablets (80 mg total) by mouth daily. 180 tablet 1  . sertraline (ZOLOFT) 50 MG tablet Take 1 tablet (50 mg total) by mouth daily. 90 tablet 1  . traZODone (DESYREL) 100 MG tablet Take 100 mg by mouth at bedtime.    Marland Kitchen zolpidem (AMBIEN CR) 6.25 MG CR tablet Take 1 tablet (6.25 mg total) by mouth at bedtime as needed. for sleep 30 tablet 2   No current facility-administered medications for this visit.    Allergies:   Nystatin, Penicillins, Aspirin, Crestor [rosuvastatin calcium], Fleet enema [enema], and Miralax [polyethylene glycol]   Social History:  The patient  reports that she has quit smoking. She has never used smokeless tobacco. She reports that she does not drink alcohol and does not use drugs.   Family History:  The patient's   family history includes Alcohol abuse in her brother, brother, maternal grandfather, mother, and son; Arthritis in her mother; COPD in her mother; Cancer in her brother, father, paternal grandfather, and paternal grandmother; Depression in her brother, brother, and mother; Diabetes in her brother and father; Drug abuse in her brother, brother, and son; Early death (age of onset: 60) in her maternal grandmother; Hearing loss in her mother; Heart disease in her maternal grandmother, paternal grandfather, and paternal grandmother; Heart disease (age of onset: 24) in her father; Heart disease (age of onset: 50) in her maternal grandfather; Hyperlipidemia in her brother and mother; Hypertension in her brother, father, and mother; Osteoarthritis in her mother.   ROS:  Please see the history of present illness.   All other systems are personally reviewed and negative.    Exam:    Vital Signs:  BP (!) 108/58   Pulse 70   Wt 118 lb (53.5 kg)   BMI 18.48 kg/m    Well appearing, alert and conversant,  regular work of breathing,  good skin color Eyes- anicteric, neuro- grossly intact, skin- no apparent rash or lesions or cyanosis, mouth- oral mucosa is pink   Labs/Other Tests and Data Reviewed:    Recent Labs: 08/15/2020: ALT 12; TSH 0.87 10/29/2020: BUN 12; Creatinine, Ser 0.81; Hemoglobin 13.7; Platelets 202; Potassium 4.4; Sodium 144   Wt Readings from Last 3 Encounters:  11/29/20 118 lb (53.5 kg)  10/29/20 121 lb (54.9 kg)  08/06/20 119 lb (54 kg)     Other studies personally reviewed: Additional studies/ records that were reviewed today include:  Last device remote is reviewed from [employer] PDF dated 12/21  which reveals normal device function,   arrhythmias - none  V lead noise reversion   ASSESSMENT & PLAN:   Inappropriate sinus tachy s/p ablation  Sinus node dysfunction secondary  Pacemaker St Jude  V lead noise reversion  orthostatic intolerance  Dyspnea on exertion  Stress and Anxiety    Palps without discernible arrhythmia  Significant anxiety  Recommended [facility]\  Continue fluid repletion   COVID 19 screen The patient denies symptoms of COVID 19 at this time.  The importance of social distancing was discussed today.  Follow-up:  8m Next remote: As Scheduled   Current medicines are reviewed at length with the patient today.   The patient does not have concerns regarding her medicines.  The following changes were made today:  none  Labs/ tests ordered today include:  Calcium Scoring  No orders of the defined types were placed in this encounter.   Future tests ( post COVID )     Patient Risk:  after full review of this patients clinical status, I feel that they are at moderate risk at this time.  Today, I have spent  21 minutes with the patient with telehealth technology discussing the above.  Signed, Sherryl Manges, MD  11/29/2020 3:08 PM     [facility] [address] [place] [address] [phone]  (office) [phone] (fax)

## 2020-12-06 IMAGING — MG MM DIGITAL DIAGNOSTIC UNILAT*L* W/ TOMO W/ CAD
4 series · 4 of 12 positions shown · non-contrast
Comparison: Previous exam(s).

CLINICAL DATA: Screening recall for possible left breast mass.

EXAM:
DIGITAL DIAGNOSTIC UNILATERAL LEFT MAMMOGRAM WITH CAD AND TOMO

[L CC]
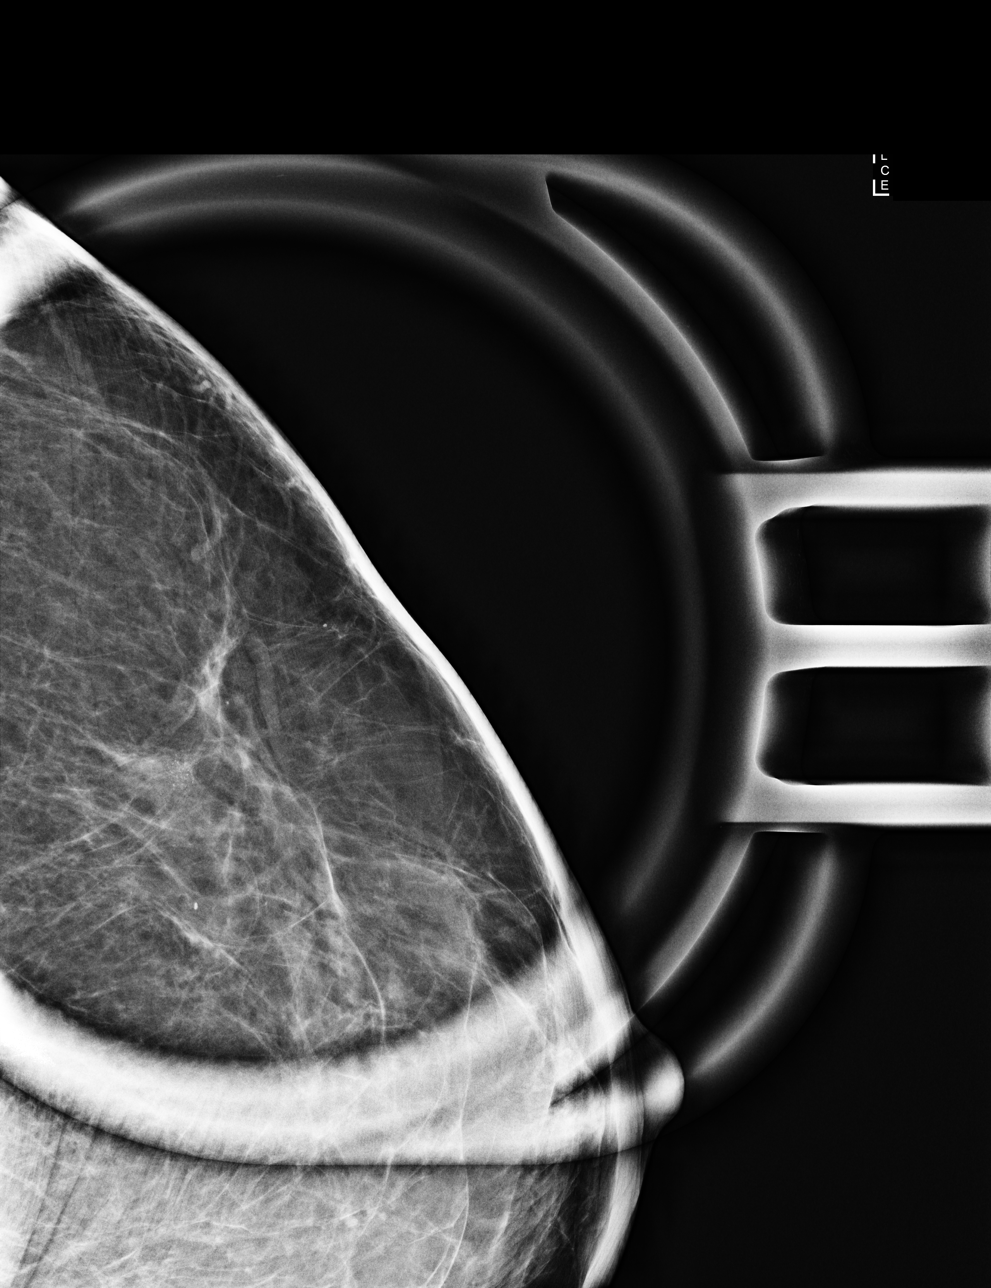

[L CC synth-2D]
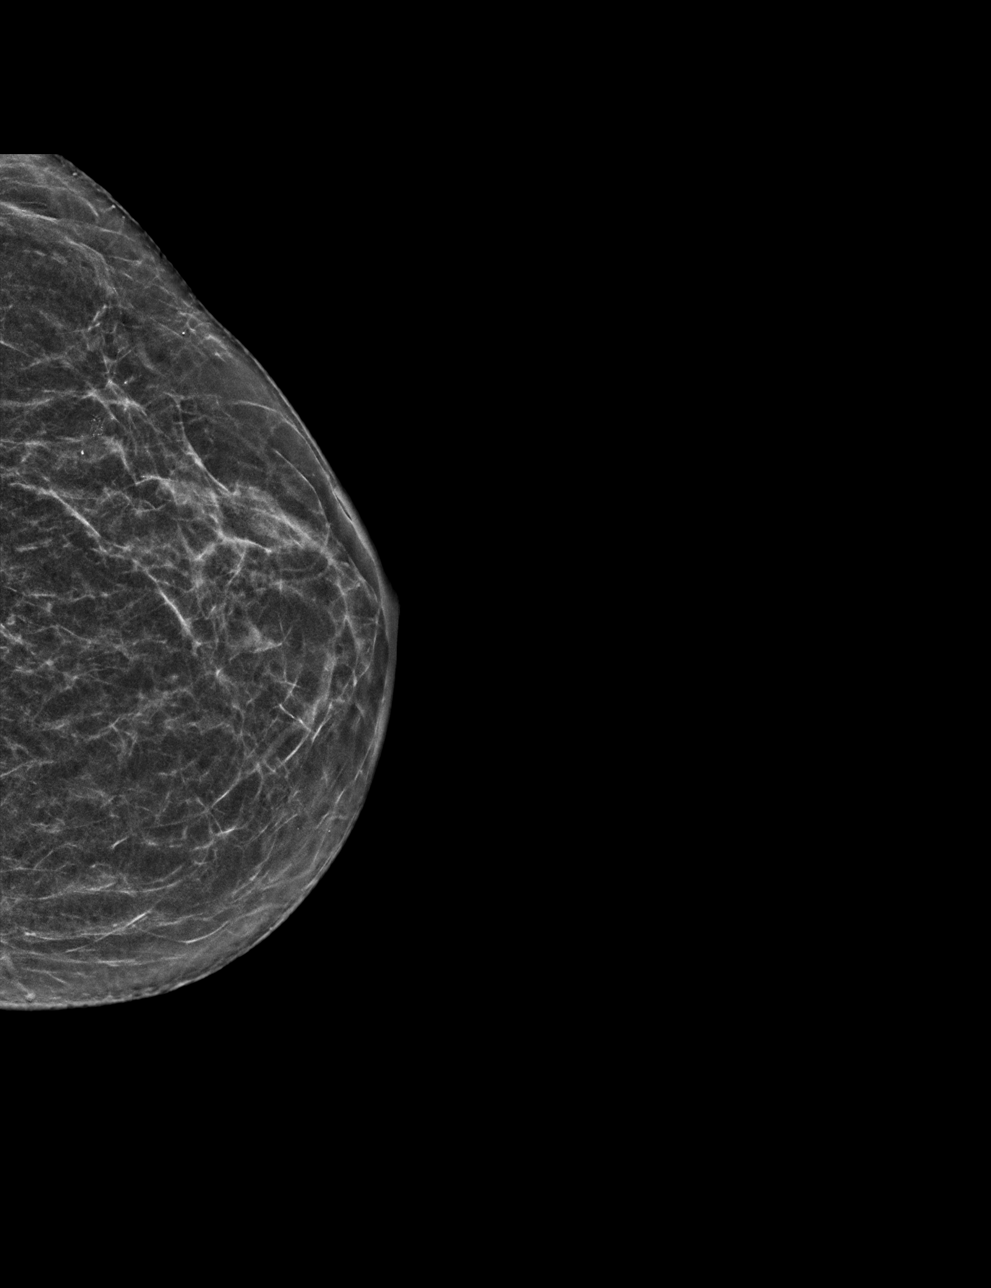

[L CC tomo · tomo slice 25/49.0]
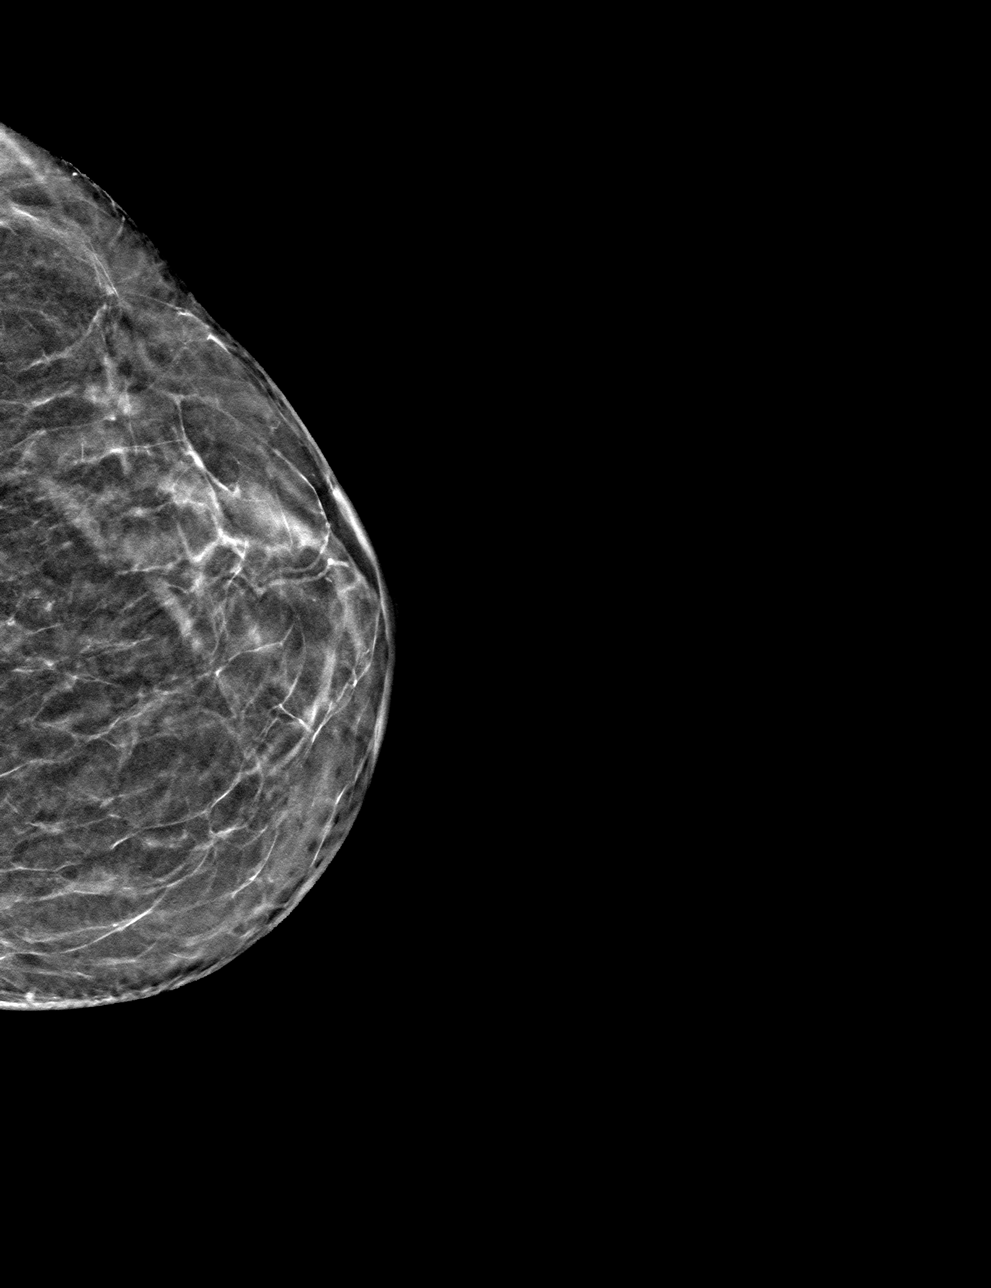

[L MLO tomo · tomo slice 30/59.0]
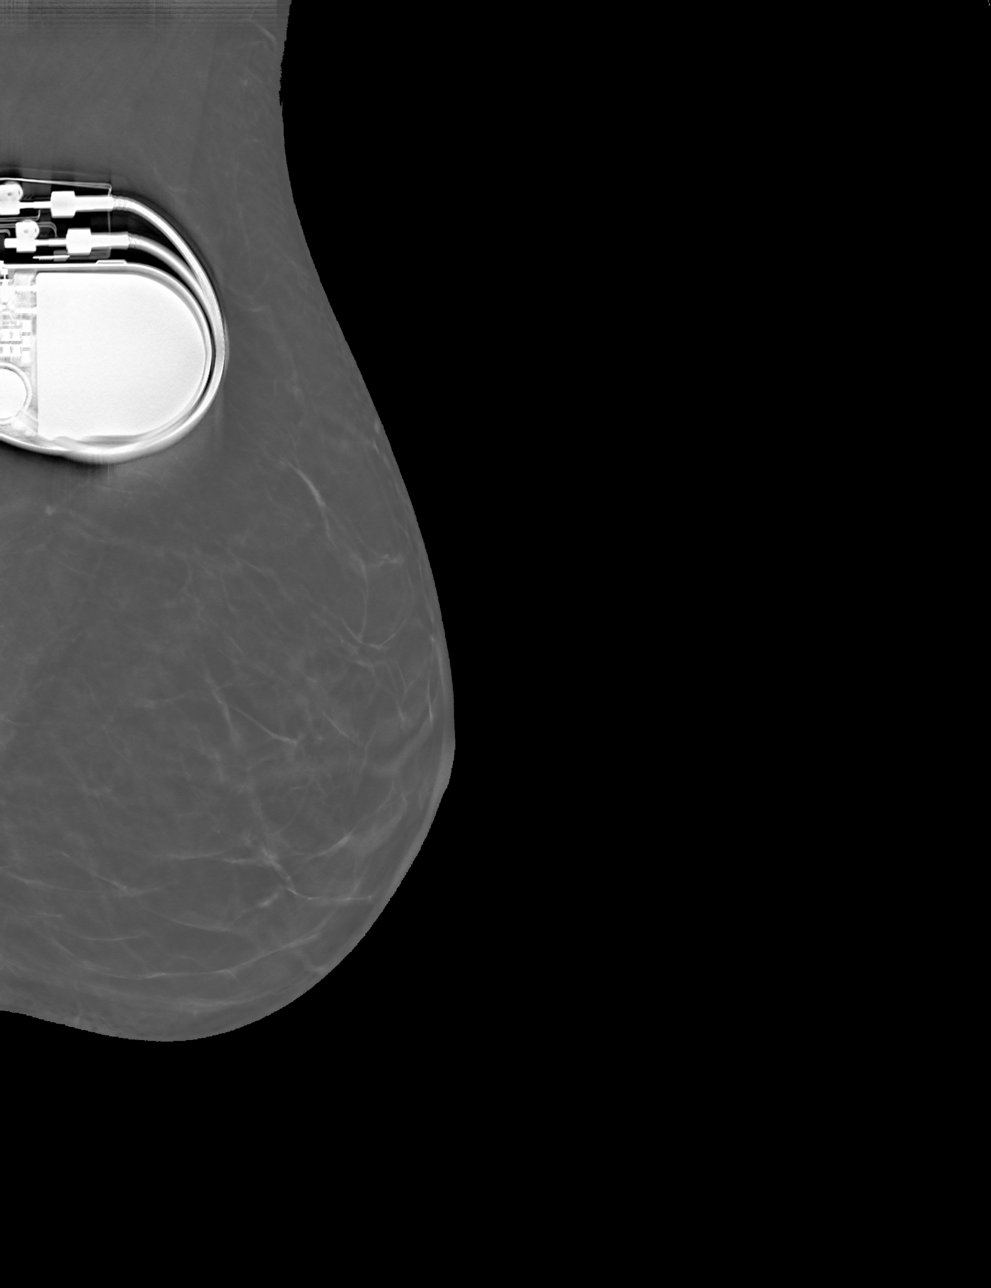

[4 of 12 positions shown; findings below may reference images not displayed]

ACR Breast Density Category b: There are scattered areas of
fibroglandular density.
FINDINGS: Additional views including tomograms and spot compression
magnification views were performed of the left breast. No persistent
mass seen in the region of concern in the upper-outer left breast
with subtle distortion in this location corresponding well to a
surgical scar. Subsequently spot compression magnification views
were performed of the upper-outer left breast demonstrating a group
of amorphous calcifications measuring 0.5 cm.

Mammographic images were processed with CAD.
IMPRESSION: Indeterminate left breast calcifications.

RECOMMENDATION:
1. Stereotactic guided biopsy of the 0.5 cm group of calcifications
in the upper-outer left breast.

2. The patient states she has prior outside mammograms from
[facility] from 0249 and earlier. These will be requested for
comparison (prior attempt at obtaining comparison studies was
unsuccessful) and if they become available and the calcifications
are shown to demonstrate long-term stability then stereo then the
stereotactic guided biopsy of the calcifications in the left breast
may be canceled.

I have discussed the findings and recommendations with the patient.
If applicable, a reminder letter will be sent to the patient
regarding the next appointment.

BI-RADS CATEGORY  4: Suspicious.

## 2021-01-01 ENCOUNTER — Ambulatory Visit: Payer: BC Managed Care – PPO | Admitting: Internal Medicine

## 2021-01-04 ENCOUNTER — Ambulatory Visit (INDEPENDENT_AMBULATORY_CARE_PROVIDER_SITE_OTHER): Payer: BC Managed Care – PPO

## 2021-01-04 DIAGNOSIS — I495 Sick sinus syndrome: Secondary | ICD-10-CM

## 2021-01-07 LAB — CUP PACEART REMOTE DEVICE CHECK
Battery Remaining Longevity: 112 mo
Battery Remaining Percentage: 95.5 %
Battery Voltage: 2.98 V
Brady Statistic AP VP Percent: 1 %
Brady Statistic AP VS Percent: 81 %
Brady Statistic AS VP Percent: 1 %
Brady Statistic AS VS Percent: 19 %
Brady Statistic RA Percent Paced: 77 %
Brady Statistic RV Percent Paced: 1 %
Date Time Interrogation Session: 20220311174843
Implantable Lead Implant Date: 20151111
Implantable Lead Implant Date: 20151111
Implantable Lead Location: 753859
Implantable Lead Location: 753860
Implantable Pulse Generator Implant Date: 20151111
Lead Channel Impedance Value: 410 Ohm
Lead Channel Impedance Value: 440 Ohm
Lead Channel Pacing Threshold Amplitude: 0.5 V
Lead Channel Pacing Threshold Amplitude: 0.5 V
Lead Channel Pacing Threshold Pulse Width: 0.4 ms
Lead Channel Pacing Threshold Pulse Width: 0.4 ms
Lead Channel Sensing Intrinsic Amplitude: 10.9 mV
Lead Channel Sensing Intrinsic Amplitude: 4.2 mV
Lead Channel Setting Pacing Amplitude: 2 V
Lead Channel Setting Pacing Amplitude: 2.5 V
Lead Channel Setting Pacing Pulse Width: 0.4 ms
Lead Channel Setting Sensing Sensitivity: 0.5 mV
Pulse Gen Model: 2240
Pulse Gen Serial Number: 7694052

## 2021-01-08 ENCOUNTER — Other Ambulatory Visit: Payer: Self-pay

## 2021-01-08 ENCOUNTER — Encounter: Payer: Self-pay | Admitting: Nurse Practitioner

## 2021-01-08 ENCOUNTER — Ambulatory Visit (INDEPENDENT_AMBULATORY_CARE_PROVIDER_SITE_OTHER): Payer: BC Managed Care – PPO | Admitting: Nurse Practitioner

## 2021-01-08 VITALS — BP 110/70 | HR 71 | Ht 67.0 in | Wt 122.0 lb

## 2021-01-08 DIAGNOSIS — K3184 Gastroparesis: Secondary | ICD-10-CM | POA: Diagnosis not present

## 2021-01-08 DIAGNOSIS — R1012 Left upper quadrant pain: Secondary | ICD-10-CM | POA: Diagnosis not present

## 2021-01-08 DIAGNOSIS — R110 Nausea: Secondary | ICD-10-CM

## 2021-01-08 DIAGNOSIS — R142 Eructation: Secondary | ICD-10-CM

## 2021-01-08 NOTE — Progress Notes (Signed)
ASSESSMENT AND PLAN    #  60 yo female with chronic nausea, belching, postprandial LUQ pain   She has a history of gastroparesis diagnosed at outside facility in 2015.  Intolerant of Reglan, domperidone helped but is off the market.  Zofran helps.  Her symptoms have gotten progressively worse since her telehealth visit here in 2020. She never did follow back up with Dr. Orvan Falconer.   Weights in epic show that she is down 10 pounds since May of last year but overall stable in the last few months.  --Due to progression of symptoms I am arranging for an EGD. The risks and benefits of EGD were discussed and the patient agrees to proceed. --Will rerequest records from GI in [place] and have her follow back up with Dr Orvan Falconer. I asked the CMA to contact the patient when records arrive and I asked the patient to make sure we received records before she comes for next visit.  --Zofran as needed.  --If EGD negative consider empirically treating for SIBO. --At some point we could consider short trial of erythromycin for gastroparesis.  This is not a long-term solution, especially since it is has associated with tachyphylaxis.     HISTORY OF PRESENT ILLNESS     Primary Gastroenterologist : Dana Danas, MD  Chief Complaint : Belching, left upper abdominal pain and nausea  [name] is a 60 y.o. female with past medical history of SSS s/p pacemaker, gastroparesis, migraine headaches  June 2020 Patient established care with Dr. Orvan Falconer in June 2020 for evaluation of gastroparesis and constipation.  Gastroparesis was diagnosed a few years prior at an outside facility and records were not available.  She did not tolerate Reglan but had a good response to domperidone until it was removed from the market.  For constipation she did not have a great response to Linzess other than taking it sometimes for a bowel purge.  She did not respond to Amitiza and apparently had hypotension with it.   She was intolerant to Motegrity due to suicidal ideation.  Plan was to obtain records from previous gastroenterologist and follow-up in 4 to 6 weeks  05/09/19 patient was unable to keep her telemedicine encounter due to work schedule. Not seen since.   Interval history  Dana Harvey is here with complaints of LUQ pain for years.the pain feels like a hunger pain but eating makes it worse. She is constantly belching and is always nauseated.   She is constantly belching.  The LUQ pain and nausea have gotten so bad that she has been living off oatmeal , PBJ sandwiches and coffee. She has tried to eat chicken but vomits it up nearly undigested several days later.  Zofran helps the nausea.   [name] has cycles of constipation every few weeks. Presently her BMs are normal. She is having a BM every day .She takes Mirlax three times a week. During cycles of constipation she may go 2 weeks or more without a bowel movement.  Eventually her bowels purge move during which time she will have multiple successive bowel movements committing her to the toilet all day.  Sometimes she vomits during bowel movements when these episodes occur  [name] feels like she has bricks in her abdomen even though her bowels are moving well.  She complains of excessive gas buildup in her intestines though denies visible distention.  Bread and dairy cause worsening gas. She is constantly belching, even in clinic today . She took Gas-x yesterday  and thinks it helped with the belching. She also took a walk yesterday which helped her pass flatus and feel better.   Data Reviewed: Oct 2021  WBC 12.2 hgb 13.5 --liver chemistries were okay.    Previous Endoscopic Evaluations / Pertinent Studies:   Apparently had an EGD and a colonoscopy in in 2018 in [place].  Records from that gastroenterologist were not obtained.  The records from [facility] gastroenterologist dates back to 20 years so they are probably irrelevant and have been  disposed of by now   Past Medical History:  Diagnosis Date   Anxiety    Chest pain at rest    normal cors on cath in 2012. Normal coronary CT in 2018. Normal stress test in 05/2019   CVA (cerebral vascular accident) (HCC) 1997   Depression    Diverticulitis    Dyspnea    previous PFT showed normal lung volumes, severe diffusion defect with DLCO 43% predicted   Fainting spell    Frequent headaches    Gastroparesis    GERD (gastroesophageal reflux disease)    Heart murmur    Insomnia    Migraines    Pacemaker    St Jude dual chamber PPM placed on 09/06/2014 by Dr. Gaynelle Adu in [place]   PAF (paroxysmal atrial fibrillation) [facility])    diagnosed by Dr. Gaynelle Adu, remote history, no recurrent   Polyp of intestine 2018   benign per patient   Seizures (HCC)    secondary to potassium level dropping (due to opioid medications per patient)   Thyroid disease     Current Medications, Allergies, Past Surgical History, Family History and Social History were reviewed in [employer] [facility] electronic medical record.   Current Outpatient Medications  Medication Sig Dispense Refill   ALPRAZolam (XANAX) 0.5 MG tablet Take 1 tablet (0.5 mg total) by mouth 2 (two) times daily as needed for anxiety. 60 tablet 2   atorvastatin (LIPITOR) 80 MG tablet Take 1 tablet (80 mg total) by mouth daily. 90 tablet 1   benzonatate (TESSALON PERLES) 100 MG capsule Take 1 capsule (100 mg total) by mouth 3 (three) times daily as needed. 20 capsule 0   ondansetron (ZOFRAN) 4 MG tablet Take 1 tablet (4 mg total) by mouth every 8 (eight) hours as needed for nausea or vomiting. 30 tablet 1   pantoprazole (PROTONIX) 40 MG tablet Take 2 tablets (80 mg total) by mouth daily. 180 tablet 1   sertraline (ZOLOFT) 50 MG tablet Take 1 tablet (50 mg total) by mouth daily. 90 tablet 1   traZODone (DESYREL) 100 MG tablet Take 100 mg by mouth at bedtime.     zolpidem (AMBIEN CR) 6.25 MG CR tablet Take 1  tablet (6.25 mg total) by mouth at bedtime as needed. for sleep 30 tablet 2   erythromycin ophthalmic ointment Place 1 application into the right eye at bedtime. For 5 days 3.5 g 0   No current facility-administered medications for this visit.    Review of Systems: No chest pain. No shortness of breath. No urinary complaints.   PHYSICAL EXAM :    Wt Readings from Last 3 Encounters:  01/08/21 122 lb (55.3 kg)  11/29/20 118 lb (53.5 kg)  10/29/20 121 lb (54.9 kg)    BP 110/70    Pulse 71    Ht 5\' 7"  (1.702 m)    Wt 122 lb (55.3 kg)    BMI 19.11 kg/m  Constitutional:  Pleasant thin female in no acute  distress. Psychiatric: Normal mood and affect. Behavior is normal. EENT: Pupils normal.  Conjunctivae are normal. No scleral icterus. Neck supple.  Cardiovascular: Normal rate, regular rhythm. No edema Pulmonary/chest: Effort normal and breath sounds normal. No wheezing, rales or rhonchi. Abdominal: Soft, nondistended, nontender. Bowel sounds active throughout. There are no masses palpable. No hepatomegaly.  No succussion splash. Neurological: Alert and oriented to person place and time. Skin: Skin is warm and dry. No rashes noted.  Willette Cluster, NP  01/08/2021, 1:45 PM

## 2021-01-08 NOTE — Patient Instructions (Signed)
If you are age 59 or older, your body mass index should be between 23-30. Your Body mass index is 19.11 kg/m. If this is out of the aforementioned range listed, please consider follow up with your Primary Care Provider.  If you are age 30 or younger, your body mass index should be between 19-25. Your Body mass index is 19.11 kg/m. If this is out of the aformentioned range listed, please consider follow up with your Primary Care Provider.   Decrease Pantoprazole to 40 mg twice daily.  Herbert Seta will call to confirm that she has received your records from [place] before your follow up appointment with Dr. Orvan Falconer.   You have been scheduled for an endoscopy. Please follow written instructions given to you at your visit today. If you use inhalers (even only as needed), please bring them with you on the day of your procedure.  Due to recent changes in healthcare laws, you may see the results of your imaging and laboratory studies on MyChart before your provider has had a chance to review them.  We understand that in some cases there may be results that are confusing or concerning to you. Not all laboratory results come back in the same time frame and the provider may be waiting for multiple results in order to interpret others.  Please give [place] 48 hours in order for your provider to thoroughly review all the results before contacting the office for clarification of your results.

## 2021-01-11 NOTE — Progress Notes (Signed)
Remote pacemaker transmission.   

## 2021-01-22 ENCOUNTER — Encounter: Payer: Self-pay | Admitting: Gastroenterology

## 2021-02-11 ENCOUNTER — Telehealth: Payer: Self-pay

## 2021-02-11 NOTE — Telephone Encounter (Signed)
LOV 01/08/21:  --Will rerequest records from GI in [place] and have her follow back up with Dr Orvan Falconer. I asked the CMA to contact the patient when records arrive and I asked the patient to make sure we received records before she comes for next visit.   Pt called to cancel upcoming appt as noted in My Chart message/Epic. Also received notification from [facility] in [place] indicating records could not be located for [facility] 2015-2020. Routing this message to Willette Cluster, NP and Dr. Orvan Falconer to make them aware.

## 2021-02-11 NOTE — Telephone Encounter (Signed)
Noted. Thanks.

## 2021-02-13 NOTE — Telephone Encounter (Signed)
Thanks

## 2021-02-14 ENCOUNTER — Ambulatory Visit: Payer: Self-pay | Admitting: Gastroenterology

## 2021-02-18 NOTE — Progress Notes (Signed)
HPI: Follow-up chest pain and previous pacemaker.  Patient has had previous ablation for inappropriate sinus tachycardia.  Apparently had cardiac catheterization in 2012 in [place] which was normal.  Had pacemaker placed for sick sinus syndrome November 2015. Cardiac CT 2018 showed normal coronary arteries.  Nuclear study 8/20 showed ejection fraction 59% with no ischemia or infarction.  Has been seen by Dr. Graciela Husbands previously for orthostasis. Also with history of 34 seconds nonsustained ventricular tachycardia noted on interrogating her device. Echocardiogram January 2022 showed normal LV systolic and diastolic function. Since last seen she is under a significant amount of stress related to her son and daughter.  She feels fatigued at times and feels like her heart is racing but she checks her heart rate and it is normal.  She has chest heaviness with stress.  She has not had syncope.  Current Outpatient Medications  Medication Sig Dispense Refill  . ALPRAZolam (XANAX) 0.5 MG tablet Take 1 tablet (0.5 mg total) by mouth 2 (two) times daily as needed for anxiety. 60 tablet 2  . ascorbic acid (VITAMIN C) 500 MG tablet     . atorvastatin (LIPITOR) 80 MG tablet Take 1 tablet (80 mg total) by mouth daily. 90 tablet 1  . cholecalciferol (VITAMIN D3) 25 MCG (1000 UNIT) tablet     . ondansetron (ZOFRAN) 4 MG tablet Take 1 tablet (4 mg total) by mouth every 8 (eight) hours as needed for nausea or vomiting. 30 tablet 1  . pantoprazole (PROTONIX) 40 MG tablet Take 2 tablets (80 mg total) by mouth daily. 180 tablet 1  . traZODone (DESYREL) 100 MG tablet Take 100 mg by mouth at bedtime.    . Vitamin E 180 MG (400 UNIT) CAPS     . zolpidem (AMBIEN CR) 6.25 MG CR tablet Take 1 tablet (6.25 mg total) by mouth at bedtime as needed. for sleep 30 tablet 2   No current facility-administered medications for this visit.     Past Medical History:  Diagnosis Date  . Anxiety   . Chest pain at rest    normal cors  on cath in 2012. Normal coronary CT in 2018. Normal stress test in 05/2019  . CVA (cerebral vascular accident) (HCC) 1997  . Depression   . Diverticulitis   . Dyspnea    previous PFT showed normal lung volumes, severe diffusion defect with DLCO 43% predicted  . Fainting spell   . Frequent headaches   . Gastroparesis   . GERD (gastroesophageal reflux disease)   . Heart murmur   . Insomnia   . Insomnia   . Migraines   . Pacemaker    St Jude dual chamber PPM placed on 09/06/2014 by Dr. Gaynelle Adu in [place]  . PAF (paroxysmal atrial fibrillation) (HCC)    diagnosed by Dr. Gaynelle Adu, remote history, no recurrent  . Polyp of intestine 2018   benign per patient  . Seizures (HCC)    secondary to potassium level dropping (due to opioid medications per patient)  . Thyroid disease     Past Surgical History:  Procedure Laterality Date  . ABDOMINAL HYSTERECTOMY  1993   heavy vaginal bleeding; later went in and removed Right ovary  . APPENDECTOMY  1984  . ATRIAL ABLATION SURGERY  2012  . BREAST EXCISIONAL BIOPSY     bilateral; benign  . CARDIAC CATHETERIZATION  2012   normal cors on cath [facility] tx  . CESAREAN SECTION  [phone]  . CHOLECYSTECTOMY  2010  . ELECTROPHYSIOLOGY STUDY  around 2013   with ablation for clinically suspected inappropriate sinus tachycardia, by Dr. Annice Pih  . LOOP RECORDER IMPLANT  2013   by Dr. Annice Pih  . PACEMAKER INSERTION  09/06/2014   St Jude Assurity dual chamber PPM, Dr Onalee Hua [place]--per patient; put in after ablation    Social History   Socioeconomic History  . Marital status: Married    Spouse name: Not on file  . Number of children: 3  . Years of education: Not on file  . Highest education level: Not on file  Occupational History  . 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 Smoking status: Former Games developer  . Smokeless tobacco: Never Used  . Tobacco comment: quit 37 years ago  Vaping Use  . Vaping Use:  Never used  Substance and Sexual Activity  . Alcohol use: Never  . Drug use: Never  . Sexual activity: Not on file  Other Topics Concern  . Not on file  Social History Narrative  . Not on file   Social Determinants of Health   Financial Resource Strain: Not on file  Food Insecurity: Not on file  Transportation Needs: Not on file  Physical Activity: Not on file  Stress: Not on file  Social Connections: Not on file  Intimate Partner Violence: Not on file    Family History  Problem Relation Age of Onset  . Osteoarthritis Mother   . Alcohol abuse Mother   . Arthritis Mother   . COPD Mother   . Depression Mother   . Hearing loss Mother   . Hyperlipidemia Mother   . Hypertension Mother   . Cancer Father   . Diabetes Father   . Heart disease Father 27       CABG  . Hypertension Father   . Alcohol abuse Brother   . Cancer Brother   . Depression Brother   . Drug abuse Brother   . Alcohol abuse Son   . Drug abuse Son   . Early death Maternal Grandmother 42  . Heart disease Maternal Grandmother   . Alcohol abuse Maternal Grandfather   . Heart disease Maternal Grandfather 70  . Cancer Paternal Grandmother   . Heart disease Paternal Grandmother   . Cancer Paternal Grandfather   . Heart disease Paternal Grandfather   . Alcohol abuse Brother   . Depression Brother   . Diabetes Brother   . Drug abuse Brother   . Hypertension Brother   . Hyperlipidemia Brother     ROS: no fevers or chills, productive cough, hemoptysis, dysphasia, odynophagia, melena, hematochezia, dysuria, hematuria, rash, seizure activity, orthopnea, PND, pedal edema, claudication. Remaining systems are negative.  Physical Exam: Well-developed well-nourished in no acute distress.  Skin is warm and dry.  HEENT is normal.  Neck is supple.  Chest is clear to auscultation with normal expansion.  Cardiovascular exam is regular rate and rhythm.  Abdominal exam nontender or distended. No masses  palpated. Extremities show no edema. neuro grossly intact  A/P  1 history of presyncope-interrogation of her device at that time showed nonsustained ventricular tachycardia.  Her LV function is normal by echocardiogram.  She has had no recurrences since last office visit. I did not add a beta-blocker due to history of orthostatic hypotension.  2 pacemaker-Per Dr. Graciela Husbands.  3 history of atypical chest pain.  Previous CTA showed no obstructive coronary disease.  4 orthostatic hypotension-continue to push  hydration and increase sodium intake.  We also previously discussed compression hose.  Olga Millers, MD

## 2021-02-26 ENCOUNTER — Other Ambulatory Visit: Payer: Self-pay

## 2021-02-26 ENCOUNTER — Encounter: Payer: Self-pay | Admitting: Cardiology

## 2021-02-26 ENCOUNTER — Ambulatory Visit (INDEPENDENT_AMBULATORY_CARE_PROVIDER_SITE_OTHER): Payer: BC Managed Care – PPO | Admitting: Cardiology

## 2021-02-26 VITALS — BP 118/82 | HR 84 | Ht 67.0 in | Wt 123.0 lb

## 2021-02-26 DIAGNOSIS — R072 Precordial pain: Secondary | ICD-10-CM | POA: Diagnosis not present

## 2021-02-26 DIAGNOSIS — I951 Orthostatic hypotension: Secondary | ICD-10-CM | POA: Diagnosis not present

## 2021-02-26 DIAGNOSIS — Z950 Presence of cardiac pacemaker: Secondary | ICD-10-CM | POA: Diagnosis not present

## 2021-02-26 DIAGNOSIS — R002 Palpitations: Secondary | ICD-10-CM

## 2021-02-26 NOTE — Patient Instructions (Signed)

## 2021-02-28 ENCOUNTER — Other Ambulatory Visit: Payer: Self-pay | Admitting: Family Medicine

## 2021-02-28 DIAGNOSIS — G4700 Insomnia, unspecified: Secondary | ICD-10-CM

## 2021-03-06 ENCOUNTER — Encounter: Payer: Self-pay | Admitting: Family Medicine

## 2021-03-07 ENCOUNTER — Telehealth (INDEPENDENT_AMBULATORY_CARE_PROVIDER_SITE_OTHER): Payer: BC Managed Care – PPO | Admitting: Family Medicine

## 2021-03-07 DIAGNOSIS — R0981 Nasal congestion: Secondary | ICD-10-CM | POA: Diagnosis not present

## 2021-03-07 MED ORDER — DOXYCYCLINE HYCLATE 100 MG PO TABS: 100.0000 mg | ORAL_TABLET | Freq: Two times a day (BID) | ORAL | 0 refills | Status: DC

## 2021-03-07 MED ORDER — PREDNISONE 20 MG PO TABS: 20.0000 mg | ORAL_TABLET | Freq: Every day | ORAL | 0 refills | Status: DC

## 2021-03-07 NOTE — Patient Instructions (Addendum)
-  I sent the medication(s) we discussed to your pharmacy: Meds ordered this encounter  Medications  . doxycycline (VIBRA-TABS) 100 MG tablet    Sig: Take 1 tablet (100 mg total) by mouth 2 (two) times daily.    Dispense:  20 tablet    Refill:  0  . predniSONE (DELTASONE) 20 MG tablet    Sig: Take 1 tablet (20 mg total) by mouth daily with breakfast.    Dispense:  4 tablet    Refill:  0     I hope you are feeling better soon!  Seek in person care promptly if your symptoms worsen, new concerns arise or you are not improving with treatment.  It was nice to meet you today. I help [facility] out with telemedicine visits on Tuesdays and Thursdays and am available for visits on those days. If you have any concerns or questions following this visit please schedule a follow up visit with your Primary Care doctor or seek care at a local urgent care clinic to avoid delays in care.

## 2021-03-07 NOTE — Progress Notes (Signed)
Virtual Visit via Video Note  I connected with Dana Harvey  on 03/07/21 at  1:00 PM EDT by a video enabled telemedicine application and verified that I am speaking with the correct person using two identifiers.  Location patient: home, [place] Location provider:work or home office Persons participating in the virtual visit: patient, provider  I discussed the limitations of evaluation and management by telemedicine and the availability of in person appointments. The patient expressed understanding and agreed to proceed.   HPI:  Acute telemedicine visit for seasonal allergies: -Onset: for the last 1-2 months - but gradually worse -Symptoms include: nasal congestion, ears are stopped up, now with sinus headache, cough - sometimes a rattling cough -Denies:fever, chills, Cp, SOB, NVD, hemoptysis -Has tried:flonase, claritin, saline rinse, afrin, zyrtec -Pertinent past medical history: see below -she had a negative covid test a few days ago -Pertinent medication allergies: Allergies  Allergen Reactions  . Nystatin Anaphylaxis  . Penicillins Anaphylaxis  . Aspirin Tinitus  . Crestor [Rosuvastatin Calcium]   . Fleet Enema [Enema]     vomiting  . Miralax [Polyethylene Glycol]     Vomiting, patient states this causes a-fib and leads to syncope  . Motegrity [Prucalopride] Other (See Comments)    Made her have bad thoughts of harming herself   ROS: See pertinent positives and negatives per HPI.  Past Medical History:  Diagnosis Date  . Anxiety   . Chest pain at rest    normal cors on cath in 2012. Normal coronary CT in 2018. Normal stress test in 05/2019  . CVA (cerebral vascular accident) (HCC) 1997  . Depression   . Diverticulitis   . Dyspnea    previous PFT showed normal lung volumes, severe diffusion defect with DLCO 43% predicted  . Fainting spell   . Frequent headaches   . Gastroparesis   . GERD (gastroesophageal reflux disease)   . Heart murmur   . Insomnia   . Insomnia   .  Migraines   . Pacemaker    St Jude dual chamber PPM placed on 09/06/2014 by Dr. Gaynelle Adu in [place]  . PAF (paroxysmal atrial fibrillation) (HCC)    diagnosed by Dr. Gaynelle Adu, remote history, no recurrent  . Polyp of intestine 2018   benign per patient  . Seizures (HCC)    secondary to potassium level dropping (due to opioid medications per patient)  . Thyroid disease     Past Surgical History:  Procedure Laterality Date  . ABDOMINAL HYSTERECTOMY  1993   heavy vaginal bleeding; later went in and removed Right ovary  . APPENDECTOMY  1984  . ATRIAL ABLATION SURGERY  2012  . BREAST EXCISIONAL BIOPSY     bilateral; benign  . CARDIAC CATHETERIZATION  2012   normal cors on cath in [facility] tx  . CESAREAN SECTION     [phone]  . CHOLECYSTECTOMY  2010  . ELECTROPHYSIOLOGY STUDY  around 2013   with ablation for clinically suspected inappropriate sinus tachycardia, by Dr. Annice Pih  . LOOP RECORDER IMPLANT  2013   by Dr. Annice Pih  . PACEMAKER INSERTION  09/06/2014   St Jude Assurity dual chamber PPM, Dr Onalee Hua [place]--per patient; put in after ablation     Current Outpatient Medications:  .  doxycycline (VIBRA-TABS) 100 MG tablet, Take 1 tablet (100 mg total) by mouth 2 (two) times daily., Disp: 20 tablet, Rfl: 0 .  predniSONE (DELTASONE) 20 MG tablet, Take 1 tablet (20 mg total) by mouth daily with breakfast., Disp: 4  tablet, Rfl: 0 .  ALPRAZolam (XANAX) 0.5 MG tablet, TAKE 1 TABLET BY MOUTH 2 TIMES DAILY AS NEEDED FOR ANXIETY., Disp: 60 tablet, Rfl: 0 .  ascorbic acid (VITAMIN C) 500 MG tablet, , Disp: , Rfl:  .  atorvastatin (LIPITOR) 80 MG tablet, Take 1 tablet (80 mg total) by mouth daily., Disp: 90 tablet, Rfl: 1 .  cholecalciferol (VITAMIN D3) 25 MCG (1000 UNIT) tablet, , Disp: , Rfl:  .  ondansetron (ZOFRAN) 4 MG tablet, Take 1 tablet (4 mg total) by mouth every 8 (eight) hours as needed for nausea or vomiting., Disp: 30 tablet, Rfl: 1 .  pantoprazole (PROTONIX) 40  MG tablet, Take 2 tablets (80 mg total) by mouth daily., Disp: 180 tablet, Rfl: 1 .  traZODone (DESYREL) 100 MG tablet, Take 100 mg by mouth at bedtime., Disp: , Rfl:  .  Vitamin E 180 MG (400 UNIT) CAPS, , Disp: , Rfl:  .  zolpidem (AMBIEN CR) 6.25 MG CR tablet, TAKE 1 TABLET (6.25 MG TOTAL) BY MOUTH AT BEDTIME AS NEEDED. FOR SLEEP, Disp: 30 tablet, Rfl: 0  EXAM:  VITALS per patient if applicable:  GENERAL: alert, oriented, appears well and in no acute distress  HEENT: atraumatic, conjunttiva clear, no obvious abnormalities on inspection of external nose and ears  NECK: normal movements of the head and neck  LUNGS: on inspection no signs of respiratory distress, breathing rate appears normal, no obvious gross SOB, gasping or wheezing  CV: no obvious cyanosis  MS: moves all visible extremities without noticeable abnormality  PSYCH/NEURO: pleasant and cooperative, no obvious depression or anxiety, speech and thought processing grossly intact  ASSESSMENT AND PLAN:  Discussed the following assessment and plan:  Nasal sinus congestion  -we discussed possible serious and likely etiologies, options for evaluation and workup, limitations of telemedicine visit vs in person visit, treatment, treatment risks and precautions. Pt prefers to treat via telemedicine empirically rather than in person at this moment. Query allergic sinusitis, possible sinusitis from chronic allergies vs other. She feels abx and prednisone has helped her in the past when this way. She opted for trying doxy and short course of prednisone. Advised inperson evaluation with allergist and/or ENT if not resolved with this.  Scheduled follow up with PCP offered: follow up as needed Advised to seek prompt in person care if worsening, new symptoms arise, or if is not improving with treatment. Discussed options for inperson care if PCP office not available. Did let this patient know that I only do telemedicine on Tuesdays and  Thursdays for [place]. Advised to schedule follow up visit with PCP or [facility] if any further questions or concerns to avoid delays in care.   I discussed the assessment and treatment plan with the patient. The patient was provided an opportunity to ask questions and all were answered. The patient agreed with the plan and demonstrated an understanding of the instructions.     Terressa Koyanagi, DO

## 2021-04-01 ENCOUNTER — Encounter: Payer: Self-pay | Admitting: Family Medicine

## 2021-04-05 ENCOUNTER — Ambulatory Visit: Payer: BC Managed Care – PPO | Admitting: Family Medicine

## 2021-04-05 ENCOUNTER — Ambulatory Visit (INDEPENDENT_AMBULATORY_CARE_PROVIDER_SITE_OTHER): Payer: BC Managed Care – PPO

## 2021-04-05 DIAGNOSIS — I495 Sick sinus syndrome: Secondary | ICD-10-CM | POA: Diagnosis not present

## 2021-04-08 LAB — CUP PACEART REMOTE DEVICE CHECK
Battery Remaining Longevity: 112 mo
Battery Remaining Percentage: 95.5 %
Battery Voltage: 2.98 V
Brady Statistic AP VP Percent: 1 %
Brady Statistic AP VS Percent: 80 %
Brady Statistic AS VP Percent: 1 %
Brady Statistic AS VS Percent: 19 %
Brady Statistic RA Percent Paced: 77 %
Brady Statistic RV Percent Paced: 1 %
Date Time Interrogation Session: 20220611005758
Implantable Lead Implant Date: 20151111
Implantable Lead Implant Date: 20151111
Implantable Lead Location: 753859
Implantable Lead Location: 753860
Implantable Pulse Generator Implant Date: 20151111
Lead Channel Impedance Value: 440 Ohm
Lead Channel Impedance Value: 440 Ohm
Lead Channel Pacing Threshold Amplitude: 0.5 V
Lead Channel Pacing Threshold Amplitude: 0.5 V
Lead Channel Pacing Threshold Pulse Width: 0.4 ms
Lead Channel Pacing Threshold Pulse Width: 0.4 ms
Lead Channel Sensing Intrinsic Amplitude: 10.8 mV
Lead Channel Sensing Intrinsic Amplitude: 5 mV
Lead Channel Setting Pacing Amplitude: 2 V
Lead Channel Setting Pacing Amplitude: 2.5 V
Lead Channel Setting Pacing Pulse Width: 0.4 ms
Lead Channel Setting Sensing Sensitivity: 0.5 mV
Pulse Gen Model: 2240
Pulse Gen Serial Number: 7694052

## 2021-04-11 ENCOUNTER — Other Ambulatory Visit: Payer: Self-pay | Admitting: Family Medicine

## 2021-04-11 ENCOUNTER — Other Ambulatory Visit: Payer: Self-pay

## 2021-04-12 ENCOUNTER — Encounter: Payer: Self-pay | Admitting: Family Medicine

## 2021-04-12 ENCOUNTER — Other Ambulatory Visit: Payer: Self-pay | Admitting: Family Medicine

## 2021-04-12 ENCOUNTER — Ambulatory Visit (INDEPENDENT_AMBULATORY_CARE_PROVIDER_SITE_OTHER): Payer: BC Managed Care – PPO | Admitting: Family Medicine

## 2021-04-12 VITALS — BP 90/60 | HR 71 | Temp 99.0°F | Ht 67.0 in | Wt 123.3 lb

## 2021-04-12 DIAGNOSIS — Z87898 Personal history of other specified conditions: Secondary | ICD-10-CM

## 2021-04-12 DIAGNOSIS — E785 Hyperlipidemia, unspecified: Secondary | ICD-10-CM | POA: Diagnosis not present

## 2021-04-12 DIAGNOSIS — Z23 Encounter for immunization: Secondary | ICD-10-CM | POA: Diagnosis not present

## 2021-04-12 DIAGNOSIS — R2231 Localized swelling, mass and lump, right upper limb: Secondary | ICD-10-CM

## 2021-04-12 DIAGNOSIS — F419 Anxiety disorder, unspecified: Secondary | ICD-10-CM | POA: Diagnosis not present

## 2021-04-12 DIAGNOSIS — G4700 Insomnia, unspecified: Secondary | ICD-10-CM

## 2021-04-12 NOTE — Progress Notes (Signed)
Dana Harvey DOB: 29-Oct-1960 Encounter date: 04/12/2021  This is a 60 y.o. female who presents with Chief Complaint  Patient presents with   Medication Refill   Arm Pain    Patient complains of a large knot right axilla since September 2021    History of present illness:  Son with drug addiction came to live with her. He had to go back to [place] to go to child custody course and didn't come back. He is living on street; she hasn't heard from him since February. Other son told her that he is back on meth. Have lost custody of kids. Daughter still deployed, but should be coming back soon. Brother that was murdered - they did catch person responsible. Things are going well and moving to trial now. Work stress is somewhat better now.   After having mammogram noted lump in right axilla. Didn't pay much attention and then significant other noted that it is big. Has been there for months; not sore. Last mammogram was 07/2019; she went for mammogram and then when they found out she had lump and needed new order; they sent her away.   Insomnia: tried melatonin - feels like it takes edge off. Sleep is still issue and she cannot sleep without medication. Taking trazodone, ambien. One alone doesn't work for her. She has been using older 10mg  ambien since she isn't getting relief with the 6.25mg  dose.  Anxiety: takes xanax after work. Has tried other anxiety control methods, but none of them seem to help her.    Allergies  Allergen Reactions   Nystatin Anaphylaxis   Penicillins Anaphylaxis   Aspirin Tinitus   Crestor [Rosuvastatin Calcium]    Fleet Enema [Enema]     vomiting   Miralax [Polyethylene Glycol]     Vomiting, patient states this causes a-fib and leads to syncope   Motegrity [Prucalopride] Other (See Comments)    Made her have bad thoughts of harming herself   Current Meds  Medication Sig   ALPRAZolam (XANAX) 0.5 MG tablet TAKE 1 TABLET BY MOUTH 2 TIMES DAILY AS NEEDED  FOR ANXIETY.   ascorbic acid (VITAMIN C) 500 MG tablet    atorvastatin (LIPITOR) 80 MG tablet TAKE 1 TABLET BY MOUTH EVERY DAY   cholecalciferol (VITAMIN D3) 25 MCG (1000 UNIT) tablet    ondansetron (ZOFRAN) 4 MG tablet Take 1 tablet (4 mg total) by mouth every 8 (eight) hours as needed for nausea or vomiting.   pantoprazole (PROTONIX) 40 MG tablet Take 2 tablets (80 mg total) by mouth daily.   traZODone (DESYREL) 100 MG tablet Take 100 mg by mouth at bedtime.   Vitamin E 180 MG (400 UNIT) CAPS    zolpidem (AMBIEN CR) 6.25 MG CR tablet TAKE 1 TABLET (6.25 MG TOTAL) BY MOUTH AT BEDTIME AS NEEDED. FOR SLEEP    Review of Systems  Constitutional:  Negative for chills, fatigue and fever.  Respiratory:  Negative for cough, chest tightness, shortness of breath and wheezing.   Cardiovascular:  Negative for chest pain, palpitations and leg swelling.  Gastrointestinal:  Negative for abdominal pain (much improved; noted pain was related to taking medications).  Psychiatric/Behavioral:  Positive for sleep disturbance. The patient is nervous/anxious.    Objective:  BP 90/60 (BP Location: Left Arm, Patient Position: Sitting, Cuff Size: Normal)   Pulse 71   Temp 99 F (37.2 C) (Oral)   Ht 5\' 7"  (1.702 m)   Wt 123 lb 4.8 oz (55.9 kg)  SpO2 96%   BMI 19.31 kg/m   Weight: 123 lb 4.8 oz (55.9 kg)   BP Readings from Last 3 Encounters:  04/12/21 90/60  02/26/21 118/82  01/08/21 110/70   Wt Readings from Last 3 Encounters:  04/12/21 123 lb 4.8 oz (55.9 kg)  02/26/21 123 lb (55.8 kg)  01/08/21 122 lb (55.3 kg)    Physical Exam Constitutional:      General: She is not in acute distress.    Appearance: She is well-developed.  Cardiovascular:     Rate and Rhythm: Normal rate and regular rhythm.     Heart sounds: Normal heart sounds. No murmur heard.   No friction rub.  Pulmonary:     Effort: Pulmonary effort is normal. No respiratory distress.     Breath sounds: Normal breath sounds. No  wheezing or rales.  Chest:     Chest wall: No mass.  Breasts:    Right: Normal. No axillary adenopathy or supraclavicular adenopathy.     Left: Normal. No axillary adenopathy or supraclavicular adenopathy.     Comments: I do not feel that right axillary mass is lymph node; feels softer and more superficial like lipoma. Approx 1-2cm right axilla.  Abdominal:     General: Abdomen is flat. Bowel sounds are normal.     Palpations: Abdomen is soft.     Tenderness: There is no abdominal tenderness.  Musculoskeletal:     Right lower leg: No edema.     Left lower leg: No edema.  Lymphadenopathy:     Upper Body:     Right upper body: No supraclavicular, axillary or pectoral adenopathy.     Left upper body: No supraclavicular, axillary or pectoral adenopathy.  Neurological:     Mental Status: She is alert and oriented to person, place, and time.  Psychiatric:        Attention and Perception: Attention normal.        Mood and Affect: Mood is anxious.        Speech: Speech normal.        Behavior: Behavior normal.    Assessment/Plan  1. Anxiety Did not get relief from anxiety with zoloft. Would like to be on as little medication as possible. Taking low dose xanax for anxiety as needed. Continue to monitor and consider alternatives to help with baseline control.   2. Hyperlipidemia, unspecified hyperlipidemia type Continue with lipitor 80mg  daily.   3. Axillary mass, right Seems soft tissue but will get and order dx mammogram for further eval.  - [place] AXILLA RIGHT; Future  4. History of abnormal mammogram See above.  - MM Digital Diagnostic Bilat; Future  5. Need for shingles vaccine - Varicella-zoster vaccine IM (Shingrix)   Return in about 6 months (around 10/12/2021).     10/14/2021, MD

## 2021-04-13 ENCOUNTER — Encounter: Payer: Self-pay | Admitting: Family Medicine

## 2021-04-14 MED ORDER — ALPRAZOLAM 0.5 MG PO TABS: 0.5000 mg | ORAL_TABLET | Freq: Two times a day (BID) | ORAL | 2 refills | Status: DC | PRN

## 2021-04-14 MED ORDER — ZOLPIDEM TARTRATE 10 MG PO TABS: 10.0000 mg | ORAL_TABLET | Freq: Every evening | ORAL | 2 refills | Status: DC | PRN

## 2021-04-17 ENCOUNTER — Other Ambulatory Visit: Payer: Self-pay | Admitting: Family Medicine

## 2021-04-17 DIAGNOSIS — R2231 Localized swelling, mass and lump, right upper limb: Secondary | ICD-10-CM

## 2021-04-19 ENCOUNTER — Encounter (HOSPITAL_BASED_OUTPATIENT_CLINIC_OR_DEPARTMENT_OTHER): Payer: Self-pay | Admitting: Emergency Medicine

## 2021-04-19 ENCOUNTER — Other Ambulatory Visit: Payer: Self-pay

## 2021-04-19 ENCOUNTER — Emergency Department (HOSPITAL_BASED_OUTPATIENT_CLINIC_OR_DEPARTMENT_OTHER)
Admission: EM | Admit: 2021-04-19 | Discharge: 2021-04-19 | Disposition: A | Discharge status: Home / Self Care | Payer: BC Managed Care – PPO | Attending: Emergency Medicine | Admitting: Emergency Medicine

## 2021-04-19 DIAGNOSIS — R079 Chest pain, unspecified: Secondary | ICD-10-CM | POA: Diagnosis not present

## 2021-04-19 DIAGNOSIS — H539 Unspecified visual disturbance: Secondary | ICD-10-CM | POA: Diagnosis not present

## 2021-04-19 DIAGNOSIS — R0602 Shortness of breath: Secondary | ICD-10-CM | POA: Diagnosis not present

## 2021-04-19 DIAGNOSIS — Z8679 Personal history of other diseases of the circulatory system: Secondary | ICD-10-CM | POA: Insufficient documentation

## 2021-04-19 DIAGNOSIS — R519 Headache, unspecified: Secondary | ICD-10-CM | POA: Insufficient documentation

## 2021-04-19 DIAGNOSIS — Z950 Presence of cardiac pacemaker: Secondary | ICD-10-CM | POA: Insufficient documentation

## 2021-04-19 DIAGNOSIS — F419 Anxiety disorder, unspecified: Secondary | ICD-10-CM | POA: Diagnosis not present

## 2021-04-19 DIAGNOSIS — R42 Dizziness and giddiness: Secondary | ICD-10-CM | POA: Diagnosis not present

## 2021-04-19 DIAGNOSIS — I951 Orthostatic hypotension: Secondary | ICD-10-CM | POA: Diagnosis not present

## 2021-04-19 DIAGNOSIS — R5383 Other fatigue: Secondary | ICD-10-CM | POA: Insufficient documentation

## 2021-04-19 LAB — COMPREHENSIVE METABOLIC PANEL
ALT: 19 U/L (ref 0–44)
AST: 19 U/L (ref 15–41)
Albumin: 4.6 g/dL (ref 3.5–5.0)
Alkaline Phosphatase: 79 U/L (ref 38–126)
Anion gap: 8 (ref 5–15)
BUN: 10 mg/dL (ref 6–20)
CO2: 26 mmol/L (ref 22–32)
Calcium: 9.5 mg/dL (ref 8.9–10.3)
Chloride: 108 mmol/L (ref 98–111)
Creatinine, Ser: 0.66 mg/dL (ref 0.44–1.00)
GFR, Estimated: >60 mL/min (ref >60)
Glucose, Bld: 84 mg/dL (ref 70–99)
Potassium: 3.7 mmol/L (ref 3.5–5.1)
Sodium: 142 mmol/L (ref 135–145)
Total Bilirubin: 0.6 mg/dL (ref 0.3–1.2)
Total Protein: 6.7 g/dL (ref 6.5–8.1)

## 2021-04-19 LAB — CBC WITH DIFFERENTIAL/PLATELET
Abs Immature Granulocytes: 0.04 10*3/uL (ref 0.00–0.07)
Basophils Absolute: 0.1 10*3/uL (ref 0.0–0.1)
Basophils Relative: 1 %
Eosinophils Absolute: 0 10*3/uL (ref 0.0–0.5)
Eosinophils Relative: 0 %
HCT: 40.7 % (ref 36.0–46.0)
Hemoglobin: 13.8 g/dL (ref 12.0–15.0)
Immature Granulocytes: 0 %
Lymphocytes Relative: 29 %
Lymphs Abs: 2.8 10*3/uL (ref 0.7–4.0)
MCH: 32.2 pg (ref 26.0–34.0)
MCHC: 33.9 g/dL (ref 30.0–36.0)
MCV: 95.1 fL (ref 80.0–100.0)
Monocytes Absolute: 0.5 10*3/uL (ref 0.1–1.0)
Monocytes Relative: 5 %
Neutro Abs: 6.4 10*3/uL (ref 1.7–7.7)
Neutrophils Relative %: 65 %
Platelets: 218 10*3/uL (ref 150–400)
RBC: 4.28 MIL/uL (ref 3.87–5.11)
RDW: 12.9 % (ref 11.5–15.5)
WBC: 9.8 10*3/uL (ref 4.0–10.5)
nRBC: 0 % (ref 0.0–0.2)

## 2021-04-19 LAB — URINALYSIS, ROUTINE W REFLEX MICROSCOPIC
Bilirubin Urine: NEGATIVE
Glucose, UA: NEGATIVE mg/dL
Hgb urine dipstick: NEGATIVE
Ketones, ur: NEGATIVE mg/dL
Leukocytes,Ua: NEGATIVE
Nitrite: NEGATIVE
Protein, ur: NEGATIVE mg/dL
Specific Gravity, Urine: 1.007 (ref 1.005–1.030)
pH: 7 (ref 5.0–8.0)

## 2021-04-19 LAB — TROPONIN I (HIGH SENSITIVITY)
Troponin I (High Sensitivity): <2 ng/L (ref <18)
Troponin I (High Sensitivity): 2 ng/L (ref <18)

## 2021-04-19 LAB — CK: Total CK: 98 U/L (ref 38–234)

## 2021-04-19 LAB — TSH: TSH: 0.649 u[IU]/mL (ref 0.350–4.500)

## 2021-04-19 MED ORDER — KETOROLAC TROMETHAMINE 30 MG/ML IJ SOLN
15.0000 mg | Freq: Once | INTRAMUSCULAR | Status: AC
  Administered 2021-04-19: 15 mg via INTRAVENOUS
  Filled 2021-04-19: qty 1

## 2021-04-19 MED ORDER — SODIUM CHLORIDE 0.9 % IV BOLUS
1000.0000 mL | Freq: Once | INTRAVENOUS | Status: AC
  Administered 2021-04-19: 1000 mL via INTRAVENOUS

## 2021-04-19 NOTE — ED Notes (Addendum)
Left eye and left side of mouth drooping.

## 2021-04-19 NOTE — ED Notes (Signed)
Patient stated that she feels a lot better than when she came in.

## 2021-04-19 NOTE — ED Triage Notes (Signed)
Pt arrives from work with reports of lightheadedness and "swimmy" feeling for 2 weeks. Endorses facial tingling for a few days. Endorses 10/10 headache. Hx of St Jude pacemaker.

## 2021-04-19 NOTE — ED Notes (Signed)
Pt walked to bathroom unassisted to provide urine sample.

## 2021-04-19 NOTE — ED Provider Notes (Signed)
[facility] EMERGENCY DEPT Provider Note   CSN: [id number] Arrival date & time: 04/19/21  6063     History Chief Complaint  Patient presents with   Dizziness    Dana Harvey is a 60 y.o. female.  Dana Harvey has a history of sick sinus syndrome and is status post [facility] Jude dual-chamber pacemaker placement.  This device was recently interrogated and has been working well.  She presents with a variety of symptoms.  She has been experiencing worsening dizziness which she describes as lightheaded and a feeling of fogginess.  She works in an outpatient clinic, and she states that even while sitting on a stool, she has to lean forward and hold on in order to avoid falling off the stool.  She has felt longstanding, progressive fatigue and exertional dyspnea.  She suffers from gastroparesis and has difficulty eating.  As a result, she eats small meals only about twice a day.  She is currently also experiencing headache with stabbing pain behind her left eye and pain diffusely over her head.  She has had some feelings that her right eye is not as sharp as her left eye.  She is concerned about a possible visual field cut in this area.  She also endorses significant stress due to her job.  He feels physically and emotionally exhausted.  The history is provided by the patient.  Dizziness Quality:  Lightheadedness and imbalance Severity:  Severe Onset quality:  Gradual Duration: 6 months, worse for 2 weeks. Timing:  Constant Progression:  Worsening Chronicity:  New Context comment:  Worse with position changes Relieved by:  Nothing Worsened by:  Standing up Ineffective treatments:  Fluids Associated symptoms: chest pain, headaches, shortness of breath and vision changes   Associated symptoms: no palpitations and no vomiting       Past Medical History:  Diagnosis Date   Anxiety    Chest pain at rest    normal cors on cath in 2012. Normal coronary CT in  2018. Normal stress test in 05/2019   CVA (cerebral vascular accident) (HCC) 1997   Depression    Diverticulitis    Dyspnea    previous PFT showed normal lung volumes, severe diffusion defect with DLCO 43% predicted   Fainting spell    Frequent headaches    Gastroparesis    GERD (gastroesophageal reflux disease)    Heart murmur    Insomnia    Insomnia    Migraines    Pacemaker    St Jude dual chamber PPM placed on 09/06/2014 by Dr. Gaynelle Adu in [place]   PAF (paroxysmal atrial fibrillation) [facility])    diagnosed by Dr. Gaynelle Adu, remote history, no recurrent   Polyp of intestine 2018   benign per patient   Seizures (HCC)    secondary to potassium level dropping (due to opioid medications per patient)   Thyroid disease     Patient Active Problem List   Diagnosis Date Noted   Sinus node dysfunction (HCC) 11/25/2020   Orthostatic intolerance 11/25/2020   Insomnia 03/22/2020   Hyperlipidemia 03/22/2020   Pacemaker 04/01/2019   Osteopenia 04/01/2019   Anxiety 04/01/2019    Past Surgical History:  Procedure Laterality Date   ABDOMINAL HYSTERECTOMY  1993   heavy vaginal bleeding; later went in and removed Right ovary   APPENDECTOMY  1984   ATRIAL ABLATION SURGERY  2012   BREAST EXCISIONAL BIOPSY     bilateral; benign   CARDIAC CATHETERIZATION  2012  normal cors on cath in [facility] tx   CESAREAN SECTION     [phone]   CHOLECYSTECTOMY  2010   ELECTROPHYSIOLOGY STUDY  around 2013   with ablation for clinically suspected inappropriate sinus tachycardia, by Dr. Annice Pih   LOOP RECORDER IMPLANT  2013   by Dr. Annice Pih   PACEMAKER INSERTION  09/06/2014   St Jude Assurity dual chamber PPM, Dr Onalee Hua [place]--per patient; put in after ablation     OB History   No obstetric history on file.     Family History  Problem Relation Age of Onset   Osteoarthritis Mother    Alcohol abuse Mother    Arthritis Mother    COPD Mother    Depression Mother    Hearing loss  Mother    Hyperlipidemia Mother    Hypertension Mother    Cancer Father    Diabetes Father    Heart disease Father 68       CABG   Hypertension Father    Alcohol abuse Brother    Cancer Brother    Depression Brother    Drug abuse Brother    Alcohol abuse Son    Drug abuse Son    Early death Maternal Grandmother 9   Heart disease Maternal Grandmother    Alcohol abuse Maternal Grandfather    Heart disease Maternal Grandfather 69   Cancer Paternal Grandmother    Heart disease Paternal Grandmother    Cancer Paternal Grandfather    Heart disease Paternal Grandfather    Alcohol abuse Brother    Depression Brother    Diabetes Brother    Drug abuse Brother    Hypertension Brother    Hyperlipidemia Brother     Social History   Tobacco Use   Smoking status: Never   Smokeless tobacco: Never   Tobacco comments:    Per patient previous info is not correct-resides with a smoker  Vaping Use   Vaping Use: Never used  Substance Use Topics   Alcohol use: Never   Drug use: Never    Home Medications Prior to Admission medications   Medication Sig Start Date End Date Taking? Authorizing Provider  ALPRAZolam Prudy Feeler) 0.5 MG tablet Take 1 tablet (0.5 mg total) by mouth 2 (two) times daily as needed for anxiety. 04/14/21   [name], MD  ascorbic acid (VITAMIN C) 500 MG tablet  07/06/20   [provider]  atorvastatin (LIPITOR) 80 MG tablet TAKE 1 TABLET BY MOUTH EVERY DAY 04/11/21   [name], MD  cholecalciferol (VITAMIN D3) 25 MCG (1000 UNIT) tablet  03/27/20   [provider]  ondansetron (ZOFRAN) 4 MG tablet Take 1 tablet (4 mg total) by mouth every 8 (eight) hours as needed for nausea or vomiting. 08/17/20   Koberlein, Paris Lore, MD  pantoprazole (PROTONIX) 40 MG tablet Take 2 tablets (80 mg total) by mouth daily. 08/24/20   [name], MD  traZODone (DESYREL) 100 MG tablet Take 100 mg by mouth at bedtime.    [provider]   Vitamin E 180 MG (400 UNIT) CAPS  08/08/20   [provider]  zolpidem (AMBIEN) 10 MG tablet Take 1 tablet (10 mg total) by mouth at bedtime as needed for sleep. 04/14/21 05/14/21  [name], MD    Allergies    Nystatin, Penicillins, Aspirin, Crestor [rosuvastatin calcium], Fleet enema [enema], Miralax [polyethylene glycol], and Motegrity [prucalopride]  Review of Systems   Review of Systems  Constitutional:  Positive for fatigue. Negative for chills and fever.  HENT:  Negative for ear pain and sore throat.   Eyes:  Positive for visual disturbance. Negative for pain.  Respiratory:  Positive for shortness of breath. Negative for cough.   Cardiovascular:  Positive for chest pain. Negative for palpitations.  Gastrointestinal:  Negative for abdominal pain and vomiting.  Genitourinary:  Negative for dysuria and hematuria.  Musculoskeletal:  Negative for arthralgias and back pain.  Skin:  Negative for color change and rash.  Neurological:  Positive for dizziness, light-headedness and headaches. Negative for seizures and syncope.  Psychiatric/Behavioral:  The patient is nervous/anxious.   All other systems reviewed and are negative.  Physical Exam Updated Vital Signs BP 135/86   Pulse 69   Temp 98.1 F (36.7 C) (Oral)   Resp 19   Ht 5\' 7"  (1.702 m)   Wt 55.9 kg   SpO2 100%   BMI 19.30 kg/m   Physical Exam Vitals and nursing note reviewed.  Constitutional:      Appearance: She is well-developed.  HENT:     Head: Normocephalic and atraumatic.  Cardiovascular:     Rate and Rhythm: Normal rate and regular rhythm.     Heart sounds: Normal heart sounds.  Pulmonary:     Effort: Pulmonary effort is normal. No tachypnea.     Breath sounds: Normal breath sounds.  Abdominal:     Palpations: Abdomen is soft.     Tenderness: There is no abdominal tenderness.  Musculoskeletal:     Right lower leg: No edema.     Left lower leg: No edema.     Comments: No muscle  tenderness to palpation in large muscle groups  Skin:    General: Skin is warm and dry.  Neurological:     General: No focal deficit present.     Mental Status: She is alert and oriented to person, place, and time.     Cranial Nerves: Cranial nerves are intact.     Sensory: Sensation is intact.     Motor: Motor function is intact.     Coordination: Coordination is intact.     Comments: Mild increase in exertion with strength testing of legs. She felt fatigued but did have normal strength  Psychiatric:        Mood and Affect: Mood normal.        Behavior: Behavior normal.    ED Results / Procedures / Treatments   Labs (all labs ordered are listed, but only abnormal results are displayed) Labs Reviewed  URINALYSIS, ROUTINE W REFLEX MICROSCOPIC - Abnormal; Notable for the following components:      Result Value   Color, Urine COLORLESS (*)    All other components within normal limits  COMPREHENSIVE METABOLIC PANEL  CBC WITH DIFFERENTIAL/PLATELET  CK  TSH  TROPONIN I (HIGH SENSITIVITY)  TROPONIN I (HIGH SENSITIVITY)    EKG EKG Interpretation  Date/Time:  Friday April 19 2021 09:37:35 EDT Ventricular Rate:  70 PR Interval:  171 QRS Duration: 89 QT Interval:  394 QTC Calculation: 426 R Axis:   84 Text Interpretation: Sinus rhythm Atrial premature complex normal axis No acute ischemia Confirmed by [id number] ([id number]) on 04/19/2021 11:00:59 AM  Radiology No results found.  Procedures Procedures   Medications Ordered in ED Medications  ketorolac (TORADOL) 30 MG/ML injection 15 mg (has no administration in time range)    ED Course  I have reviewed the triage vital signs and the nursing notes.  Pertinent labs &  imaging results that were available during my care of the patient were reviewed by me and considered in my medical decision making (see chart for details).    MDM Rules/Calculators/A&P                          Dana Harvey has a complicated  cardiovascular history and has had a pacemaker placed for sick sinus syndrome.  She presents with a variety of ongoing physical complaints.  She feels very fatigued, anxious, dizzy.  She was evaluated for evidence of anemia, electrolyte abnormality, acute coronary syndrome.  ED work-up was largely within normal limits.  We did discuss the fact that this is likely compounded by overwhelm, anxiety, and may be some burnout.  I did advise her to seek therapy to deal with some of her symptoms. Final Clinical Impression(s) / ED Diagnoses Final diagnoses:  Orthostatic hypotension  Nonintractable headache, unspecified chronicity pattern, unspecified headache type    Rx / DC Orders ED Discharge Orders     None        Koleen DistanceWright, Haze Antillon G, MD 04/19/21 1530

## 2021-04-19 NOTE — ED Notes (Signed)
Pt placed in supine position. Will do orthostatic vitals in approximately 10 minutes.

## 2021-04-22 ENCOUNTER — Telehealth: Payer: Self-pay | Admitting: Internal Medicine

## 2021-04-22 NOTE — Telephone Encounter (Signed)
Attempted phone call to pt.  Call went directly to voicemail.  Requested pt return call to RN at [phone].

## 2021-04-22 NOTE — Telephone Encounter (Signed)
Patient returning call.

## 2021-04-22 NOTE — Telephone Encounter (Signed)
Pt c/o BP issue: STAT if pt c/o blurred vision, one-sided weakness or slurred speech  1. What are your last 5 BP readings? 90/64 this morning  2. Are you having any other symptoms (ex. Dizziness, headache, blurred vision, passed out)? Dizziness, Headache, fogey   3. What is your BP issue? Low    STAT if HR is under 50 or over 120 (normal HR is 60-100 beats per minute)  What is your heart rate? 101 right   Do you have a log of your heart rate readings (document readings)? Yes right now 102, 104, 102 right now  Do you have any other symptoms? Tired   STAT if patient feels like he/she is going to faint   Are you dizzy now? No   Do you feel faint or have you passed out? Dizzy when standing up  Do you have any other symptoms? Tired  Have you checked your HR and BP (record if available)? Yes

## 2021-04-22 NOTE — Telephone Encounter (Signed)
Spoke with pt who is complaining of orthostatic hypotension and dizziness.  Pt was seen in the ED 04/19/2021, evaluated and advised symptoms possibly d/t pt's stress level.  Discussed importance of hydration and salt repletion to help with BP.  Pt states she is drinking gatorade and changing positions slowly.  Pt advised will have EP scheduler, [place] contact to schedule pt to see Dr Graciela Husbands or APP.  Reviewed ED precautions and continue to monitor BP and HR.  Pt verbalizes understanding and agrees with current plan.

## 2021-04-23 NOTE — Progress Notes (Signed)
Remote ICD transmission.   

## 2021-04-26 ENCOUNTER — Encounter: Payer: Self-pay | Admitting: Internal Medicine

## 2021-04-26 ENCOUNTER — Encounter: Payer: Self-pay | Admitting: Family Medicine

## 2021-04-26 ENCOUNTER — Other Ambulatory Visit: Payer: Self-pay

## 2021-04-26 ENCOUNTER — Ambulatory Visit (INDEPENDENT_AMBULATORY_CARE_PROVIDER_SITE_OTHER): Payer: BC Managed Care – PPO | Admitting: Internal Medicine

## 2021-04-26 VITALS — BP 124/74 | HR 70 | Ht 67.0 in | Wt 122.0 lb

## 2021-04-26 DIAGNOSIS — I951 Orthostatic hypotension: Secondary | ICD-10-CM

## 2021-04-26 DIAGNOSIS — Z950 Presence of cardiac pacemaker: Secondary | ICD-10-CM | POA: Diagnosis not present

## 2021-04-26 DIAGNOSIS — I495 Sick sinus syndrome: Secondary | ICD-10-CM

## 2021-04-26 DIAGNOSIS — E876 Hypokalemia: Secondary | ICD-10-CM

## 2021-04-26 LAB — BASIC METABOLIC PANEL
BUN/Creatinine Ratio: 15 (ref 9–23)
BUN: 10 mg/dL (ref 6–24)
CO2: 23 mmol/L (ref 20–29)
Calcium: 9.9 mg/dL (ref 8.7–10.2)
Chloride: 104 mmol/L (ref 96–106)
Creatinine, Ser: 0.66 mg/dL (ref 0.57–1.00)
Glucose: 95 mg/dL (ref 65–99)
Potassium: 4.2 mmol/L (ref 3.5–5.2)
Sodium: 141 mmol/L (ref 134–144)
eGFR: 101 mL/min/{1.73_m2} (ref >59)

## 2021-04-26 MED ORDER — MIDODRINE HCL 2.5 MG PO TABS: 2.5000 mg | ORAL_TABLET | Freq: Every morning | ORAL | 5 refills | Status: DC

## 2021-04-26 NOTE — Progress Notes (Signed)
Patient ID: Dana Harvey, female   DOB: 05/01/1961, 60 y.o.   MRN: [id number]      ELECTROPHYSIOLOGY OFFICE NOTE  Patient ID: Dana Harvey, MRN: [id number], DOB/AGE: 1961/01/06 60 y.o. Admit date: (Not on file) Date of Consult: 04/26/2021  Primary Physician: [name], MD Primary Cardiologist: new      Dana Harvey is a 60 y.o. female who is being seen today for the evaluation of pacemaker and tachycardia  at the request of BC.              HPI Dana Harvey is a 60 y.o. female seen in follow-up for pacemaker implanted elsewhere in 2015.  Symptoms of fatigue and Dx with chronic fatigue and fibromyalgia with a diagnosis of orthostatic intolerance and dyspnea and saw many MDs finally Dx with inappropriate sinus tachycardia and underwent ablation with significant improvement   Then had recurrent symptoms and loop showed >> tachy and "nocturnal bradycardia" and paced with resolution of almost all of her symptoms.  She understood it was to "fix her Afib"  At initial visit, encouraged her to pursue compression and hydration  Seen in the emergency room earlier this month with complaints of worsening dizziness and lightheadedness and fogginess.  Worsening fatigue and dyspnea.  Apparently has gastroparesis.  Describes herself as being exhausted to the ER physician, physically and emotionally.  Orthostatics in the emergency room demonstrated decrease in pulse of 20 without a change in heart rate.                         Today, the patient denies chest pain, shortness of breath, nocturnal dyspnea, orthopnea or peripheral edema.  There have been no palpitations, or syncope.    Complains of lightheadedness occurs while standing. When standing, it is relieved by sitting.   Associated with palpitations.  She notes morning are the most difficult for her due to tachycardia and dizziness. She notes she takes shower in the morning which does not mitigate her  symptoms .  She notes her bp range of 130/70 is outside her normal which is accompanied by a tingling sensation in her lips and a headache.  When at work she feels fatigue and can tell her BP dropped. She often feels the need to sit down between patients because of dizziness and lightheadedness   She notes 4 days ago she have decreased taking her Ambien and having less tachycardia in the mornings   She have been working at home recently and notes her symptoms have subsided some because she is not as active if were to be at work   DATE TEST EF   8/20 .myoview  59 % No ischemia  8/20 Echo  50-55%   1/22 Echo         Date   Cr              K           Hgb   10/21 0.69 4.0      6/22  0.66 3.7            13.8    Past Medical History:  Diagnosis Date   Anxiety    Chest pain at rest    normal cors on cath in 2012. Normal coronary CT in 2018. Normal stress test in 05/2019   CVA (cerebral vascular accident) [facility]) 1997   Depression    Diverticulitis    Dyspnea  previous PFT showed normal lung volumes, severe diffusion defect with DLCO 43% predicted   Fainting spell    Frequent headaches    Gastroparesis    GERD (gastroesophageal reflux disease)    Heart murmur    Insomnia    Insomnia    Migraines    Pacemaker    St Jude dual chamber PPM placed on 09/06/2014 by Dr. Gaynelle Adu in [place]   PAF (paroxysmal atrial fibrillation) [facility])    diagnosed by Dr. Gaynelle Adu, remote history, no recurrent   Polyp of intestine 2018   benign per patient   Seizures (HCC)    secondary to potassium level dropping (due to opioid medications per patient)   Thyroid disease       Surgical History:  Past Surgical History:  Procedure Laterality Date   ABDOMINAL HYSTERECTOMY  1993   heavy vaginal bleeding; later went in and removed Right ovary   APPENDECTOMY  1984   ATRIAL ABLATION SURGERY  2012   BREAST EXCISIONAL BIOPSY     bilateral; benign   CARDIAC CATHETERIZATION  2012   normal cors on  cath in [facility] tx   CESAREAN SECTION     [phone]   CHOLECYSTECTOMY  2010   ELECTROPHYSIOLOGY STUDY  around 2013   with ablation for clinically suspected inappropriate sinus tachycardia, by Dr. Annice Pih   LOOP RECORDER IMPLANT  2013   by Dr. Annice Pih   PACEMAKER INSERTION  09/06/2014   St Jude Assurity dual chamber PPM, Dr Onalee Hua [place]--per patient; put in after ablation     Home Meds: Current Meds  Medication Sig   ALPRAZolam (XANAX) 0.5 MG tablet Take 1 tablet (0.5 mg total) by mouth 2 (two) times daily as needed for anxiety.   ascorbic acid (VITAMIN C) 500 MG tablet    atorvastatin (LIPITOR) 80 MG tablet TAKE 1 TABLET BY MOUTH EVERY DAY   cholecalciferol (VITAMIN D3) 25 MCG (1000 UNIT) tablet    midodrine (PROAMATINE) 2.5 MG tablet Take 1 tablet (2.5 mg total) by mouth every morning.   ondansetron (ZOFRAN) 4 MG tablet Take 1 tablet (4 mg total) by mouth every 8 (eight) hours as needed for nausea or vomiting.   pantoprazole (PROTONIX) 40 MG tablet Take 2 tablets (80 mg total) by mouth daily.   traZODone (DESYREL) 100 MG tablet Take 100 mg by mouth at bedtime.   Vitamin E 180 MG (400 UNIT) CAPS    zolpidem (AMBIEN) 10 MG tablet Take 1 tablet (10 mg total) by mouth at bedtime as needed for sleep.    Allergies:  Allergies  Allergen Reactions   Nystatin Anaphylaxis   Penicillins Anaphylaxis   Aspirin Tinitus   Crestor [Rosuvastatin Calcium]    Fleet Enema [Enema]     vomiting   Miralax [Polyethylene Glycol]     Vomiting, patient states this causes a-fib and leads to syncope   Motegrity [Prucalopride] Other (See Comments)    Made her have bad thoughts of harming herself           ROS:  Please see the history of present illness.     All other systems reviewed and negative.   Physical Exam: BP 124/74   Pulse 70   Ht 5\' 7"  (1.702 m)   Wt 122 lb (55.3 kg)   SpO2 97%   BMI 19.11 kg/m  Well developed and nourished in no acute distress HENT norma\Neck supple  with JVP-  flat  Lungs Clear Regular rate and rhythm, no murmurs or  gallops Abd-soft with active BS No Clubbing cyanosis No edema Skin-warm and dry A & Oriented  Grossly normal sensory and motor function  ECG: Reviewed from the emergency room 04/19/2021 sinus rhythm at 70 Interval 17/09/39  Assessment and Plan:   Inappropriate sinus tachy s/p ablation  Sinus node dysfunction secondary  Pacemaker St Jude   orthostatic intolerance  Headaches  Dyspnea on exertion  Significant orthostatic symptoms.  Apparently discussion regarding compressive wear of the abdomen and/or thigh as well as rehydration.  Extensiveily Discussed the potential benefit of showers at night and in the morning to decrease the vasodilatation.  As mornings are worse time, begin her on ProAmatine 2.5 mg to be taken upon arising.   Making medication adjustments, will ask her work to allow her to work from home next week as the zolpidem comes out and the [employer] goes in     [employer] as a Neurosurgeon for Dana Manges, MD.,have documented all relevant documentation on the behalf of Dana Manges, MD,as directed by  Dana Manges, MD while in the presence of Dana Manges, MD.  I, Dana Harvey, have reviewed all documentation for this visit. The documentation on 04/26/21 for the exam, diagnosis, procedures, and orders are all accurate and complete.

## 2021-04-26 NOTE — Patient Instructions (Addendum)
Medication Instructions:  Your physician has recommended you make the following change in your medication:   ** Begin Proamatine2.5mg  - 1 tablet by mouth every morning.  *If you need a refill on your cardiac medications before your next appointment, please call your pharmacy*   Lab Work: BMET  If you have labs (blood work) drawn today and your tests are completely normal, you will receive your results only by: MyChart Message (if you have MyChart) OR A paper copy in the mail If you have any lab test that is abnormal or we need to change your treatment, we will call you to review the results.   Testing/Procedures: None ordered.    Follow-Up: At [facility], you and your health needs are our priority.  As part of our continuing mission to provide you with exceptional heart care, we have created designated Provider Care Teams.  These Care Teams include your primary Cardiologist (physician) and Advanced Practice Providers (APPs -  Physician Assistants and Nurse Practitioners) who all work together to provide you with the care you need, when you need it.  We recommend signing up for the patient portal called "MyChart".  Sign up information is provided on this After Visit Summary.  MyChart is used to connect with patients for Virtual Visits (Telemedicine).  Patients are able to view lab/test results, encounter notes, upcoming appointments, etc.  Non-urgent messages can be sent to your provider as well.   To learn more about what you can do with MyChart, go to [email or web address].    Your next appointment:   6 month(s)  The format for your next appointment:   In Person  Provider:   Sherryl Manges, MD

## 2021-04-26 NOTE — Progress Notes (Signed)
Patient ID: Dana Harvey, female   DOB: Aug 25, 1961, 60 y.o.   MRN: [id number]      ELECTROPHYSIOLOGY OFFICE NOTE  Patient ID: Dana Harvey, MRN: [id number], DOB/AGE: 11-Mar-1961 60 y.o. Admit date: (Not on file) Date of Consult: 04/26/2021  Primary Physician: [name], MD Primary Cardiologist: new      Dana Harvey is a 60 y.o. female who is being seen today for the evaluation of pacemaker and tachycardia  at the request of BC.              HPI Dana Harvey is a 60 y.o. female seen in follow-up for pacemaker implanted elsewhere in 2015.  Symptoms of fatigue and Dx with chronic fatigue and fibromyalgia with a diagnosis of orthostatic intolerance and dyspnea and saw many MDs finally Dx with inappropriate sinus tachycardia and underwent ablation with significant improvement   Then had recurrent symptoms and loop showed >> tachy and "nocturnal bradycardia" and paced with resolution of almost all of her symptoms.  She understood it was to "fix her Afib"  At initial visit, encouraged her to pursue compression and hydration  Seen in the emergency room earlier this month with complaints of worsening dizziness and lightheadedness and fogginess.  Worsening fatigue and dyspnea.  Apparently has gastroparesis.  Describes herself as being exhausted to the ER physician, physically and emotionally.  Orthostatics in the emergency room demonstrated decrease in pulse of 20 without a change in heart rate.                               <Ed Visit 04/19/21>  Today, the patient denies chest pain, shortness of breath, nocturnal dyspnea, orthopnea or peripheral edema.  There have been no palpitations, or syncope.    Complains of Lightheadedness occurs while standing. While standing, it is relieved by sitting. Not Associated with palpitations.   She notes morning are difficult for her due to tachycardia and dizziness. Improved  with drinking water. sometimes she  showers in the morning and this is particularly challenging . She notes her bp is bp 130/70 which is outside her normal which is accompanied by a tingling sensation in her lips and a headache.  When at work she feels fatigue and can tell her BP dropped and often feels the need to sit down between patients because of dizziness and lightheadedness    She notes 4 days ago she have decreased taking her Ambien and having less tachycardia in the mornings   She have been working at home recently and notes her symptoms have subsided some because she is not as active if were to be at work   DATE TEST EF   8/20 .myoview  59 % No ischemia  8/20 Echo  50-55%   1/22 Echo         Date   Cr              K           Hgb   10/21 0.69 4.0      6/22  0.66 3.7            13.8    Past Medical History:  Diagnosis Date   Anxiety    Chest pain at rest    normal cors on cath in 2012. Normal coronary CT in 2018. Normal stress test in 05/2019   CVA (cerebral vascular accident) [facility]) 1997   Depression  Diverticulitis    Dyspnea    previous PFT showed normal lung volumes, severe diffusion defect with DLCO 43% predicted   Fainting spell    Frequent headaches    Gastroparesis    GERD (gastroesophageal reflux disease)    Heart murmur    Insomnia    Insomnia    Migraines    Pacemaker    St Jude dual chamber PPM placed on 09/06/2014 by Dr. Gaynelle Adu in [place]   PAF (paroxysmal atrial fibrillation) [facility])    diagnosed by Dr. Gaynelle Adu, remote history, no recurrent   Polyp of intestine 2018   benign per patient   Seizures (HCC)    secondary to potassium level dropping (due to opioid medications per patient)   Thyroid disease       Surgical History:  Past Surgical History:  Procedure Laterality Date   ABDOMINAL HYSTERECTOMY  1993   heavy vaginal bleeding; later went in and removed Right ovary   APPENDECTOMY  1984   ATRIAL ABLATION SURGERY  2012   BREAST EXCISIONAL BIOPSY     bilateral; benign    CARDIAC CATHETERIZATION  2012   normal cors on cath in [facility] tx   CESAREAN SECTION     [phone]   CHOLECYSTECTOMY  2010   ELECTROPHYSIOLOGY STUDY  around 2013   with ablation for clinically suspected inappropriate sinus tachycardia, by Dr. Annice Pih   LOOP RECORDER IMPLANT  2013   by Dr. Annice Pih   PACEMAKER INSERTION  09/06/2014   St Jude Assurity dual chamber PPM, Dr Onalee Hua [place]--per patient; put in after ablation     Home Meds: Current Meds  Medication Sig   ALPRAZolam (XANAX) 0.5 MG tablet Take 1 tablet (0.5 mg total) by mouth 2 (two) times daily as needed for anxiety.   ascorbic acid (VITAMIN C) 500 MG tablet    atorvastatin (LIPITOR) 80 MG tablet TAKE 1 TABLET BY MOUTH EVERY DAY   cholecalciferol (VITAMIN D3) 25 MCG (1000 UNIT) tablet    ondansetron (ZOFRAN) 4 MG tablet Take 1 tablet (4 mg total) by mouth every 8 (eight) hours as needed for nausea or vomiting.   pantoprazole (PROTONIX) 40 MG tablet Take 2 tablets (80 mg total) by mouth daily.   traZODone (DESYREL) 100 MG tablet Take 100 mg by mouth at bedtime.   Vitamin E 180 MG (400 UNIT) CAPS    zolpidem (AMBIEN) 10 MG tablet Take 1 tablet (10 mg total) by mouth at bedtime as needed for sleep.    Allergies:  Allergies  Allergen Reactions   Nystatin Anaphylaxis   Penicillins Anaphylaxis   Aspirin Tinitus   Crestor [Rosuvastatin Calcium]    Fleet Enema [Enema]     vomiting   Miralax [Polyethylene Glycol]     Vomiting, patient states this causes a-fib and leads to syncope   Motegrity [Prucalopride] Other (See Comments)    Made her have bad thoughts of harming herself           ROS:  Please see the history of present illness.     All other systems reviewed and negative.   Physical Exam: BP 124/74   Pulse 70   Ht 5\' 7"  (1.702 m)   Wt 122 lb (55.3 kg)   SpO2 97%   BMI 19.11 kg/m  Well developed and nourished in no acute distress HENT norma\Neck supple with JVP-  flat  Lungs Clear Regular  rate and rhythm, no murmurs or gallops Abd-soft with active BS No Clubbing cyanosis No  edema Skin-warm and dry A & Oriented  Grossly normal sensory and motor function  ECG: Reviewed from the emergency room 04/19/2021 sinus rhythm at 70 Interval 17/09/39  Assessment and Plan:   Inappropriate sinus tachy s/p ablation  Sinus node dysfunction secondary  Pacemaker St Jude   orthostatic intolerance  Headaches  Dyspnea on exertion  Significant orthostatic symptoms.  Apparently discussion regarding compressive wear of the abdomen and/or thigh as well as rehydration.  Extensiveily Discussed the potential benefit of showers at night and in the morning to decrease the vasodilatation.  As mornings are worse time, begin her on ProAmatine 2.5 mg to be taken upon arising.   Making medication adjustments, will ask her work to allow her to work from home next week as the zolpidem comes out and the [employer] goes in     [employer] as a Neurosurgeon for Dana Manges, MD.,have documented all relevant documentation on the behalf of Dana Manges, MD,as directed by  Dana Manges, MD while in the presence of Dana Manges, MD.  I, Dana Manges, MD, have reviewed all documentation for this visit. The documentation on 04/26/21 for the exam, diagnosis, procedures, and orders are all accurate and complete.

## 2021-05-06 MED ORDER — UBRELVY 100 MG PO TABS: ORAL_TABLET | ORAL | 2 refills | Status: DC

## 2021-05-08 MED ORDER — MIDODRINE HCL 2.5 MG PO TABS: 2.5000 mg | ORAL_TABLET | Freq: Two times a day (BID) | ORAL | 5 refills | Status: DC

## 2021-05-18 IMAGING — CT CT ABD-PELV W/O CM
2 of 4 series · 11 of 46 positions shown, 12 images · non-contrast
Comparison: None

CLINICAL DATA: Left-sided flank pain with microhematuria, history
of cholecystectomy, appendectomy and hysterectomy

EXAM:
CT ABDOMEN AND PELVIS WITHOUT CONTRAST
TECHNIQUE: Multidetector CT imaging of the abdomen and pelvis was performed
following the standard protocol without IV contrast.

[Series 2: renal stone 5.00 br40 s3 axial · axial · 0.48mm/px · z∈[+1290,+1640]mm · 8 of 84 slices shown, 9 images]
[im 7/84  soft-tissue]
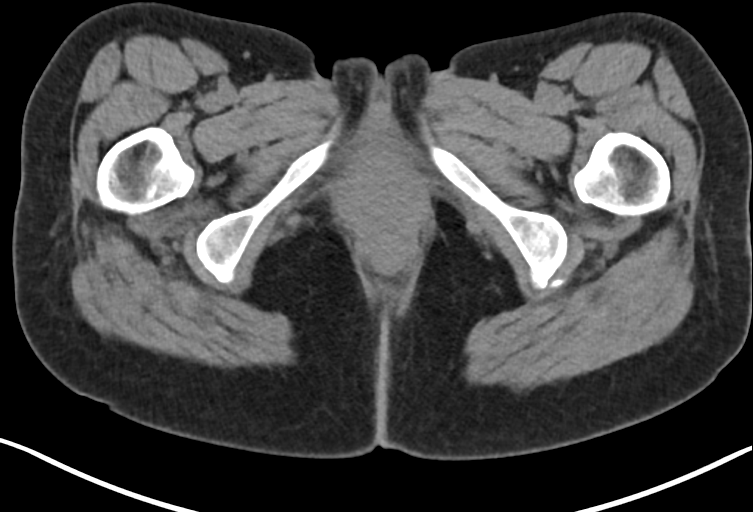
[im 7/84  bone]
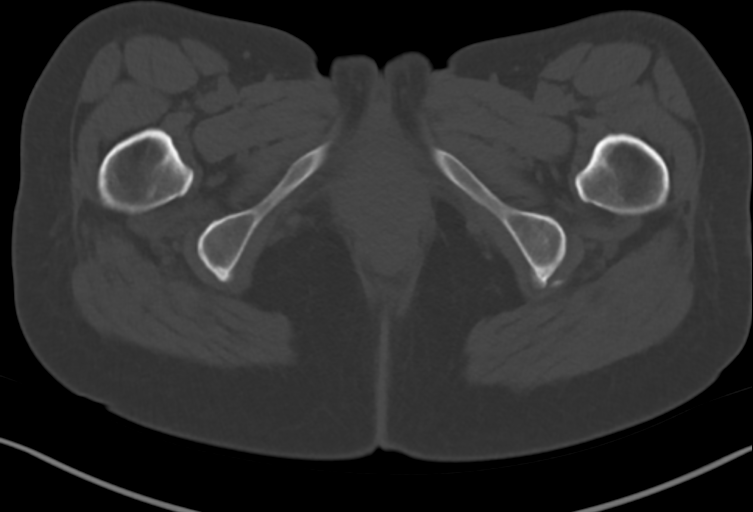
[im 17/84  soft-tissue]
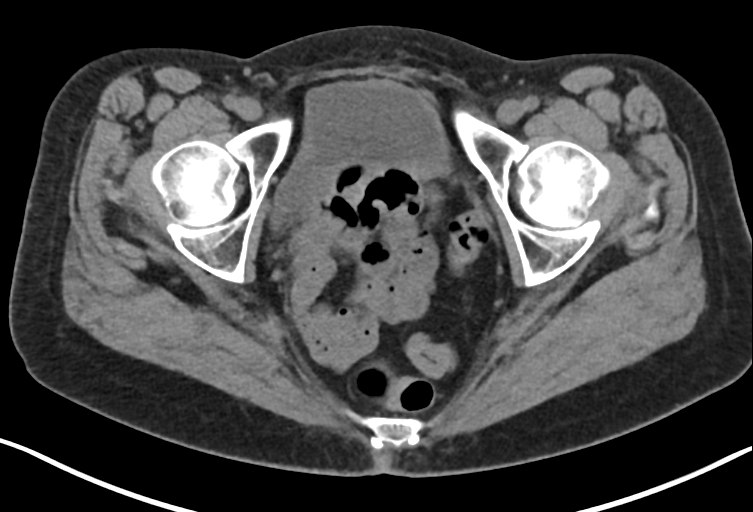
[im 27/84  soft-tissue]
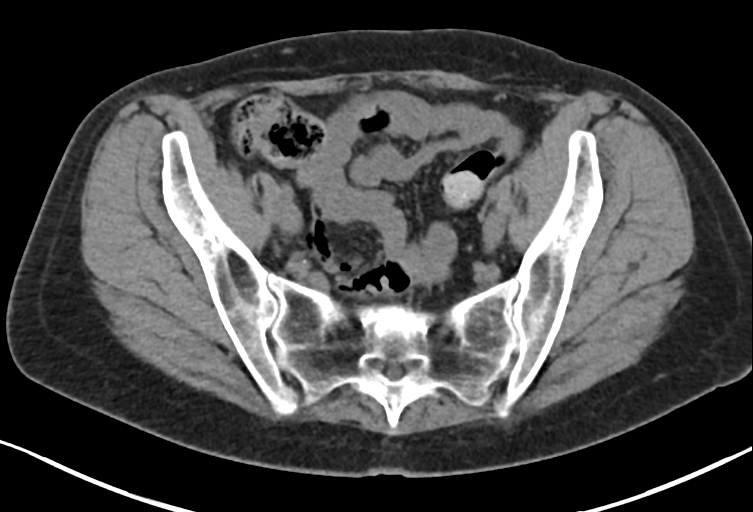
[im 37/84  soft-tissue]
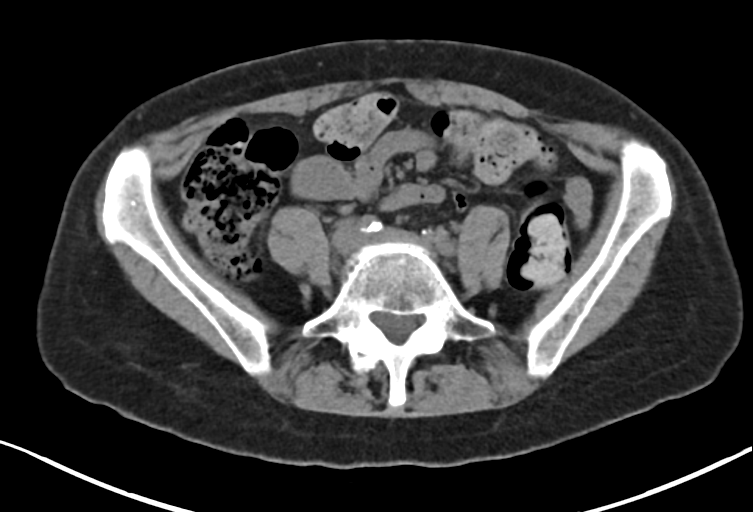
[im 47/84  soft-tissue]
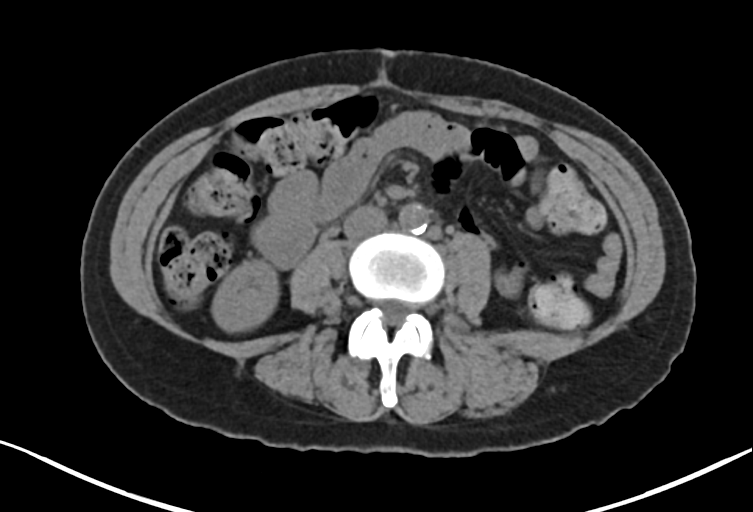
[im 57/84  soft-tissue]
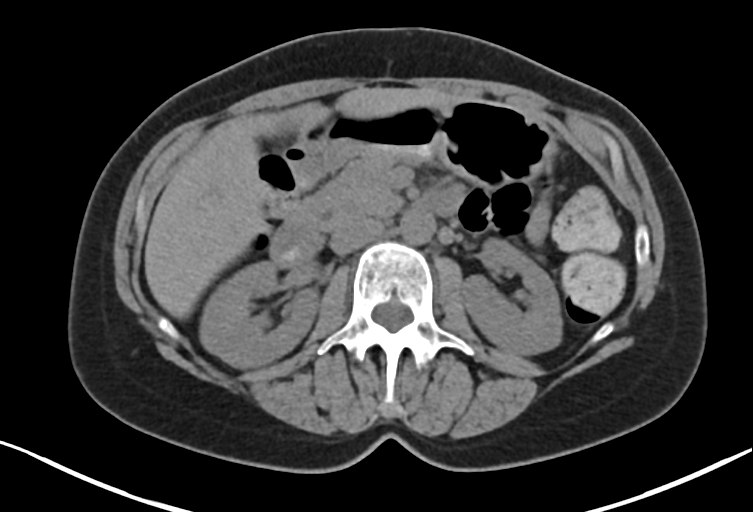
[im 67/84  soft-tissue]
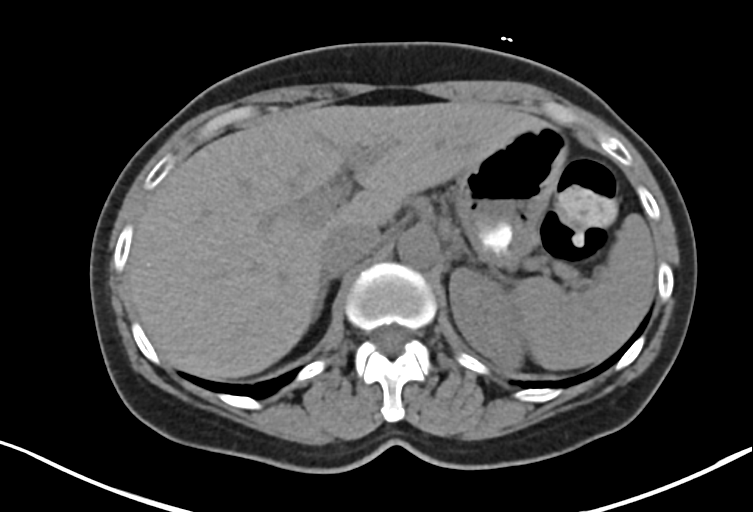
[im 77/84  soft-tissue]
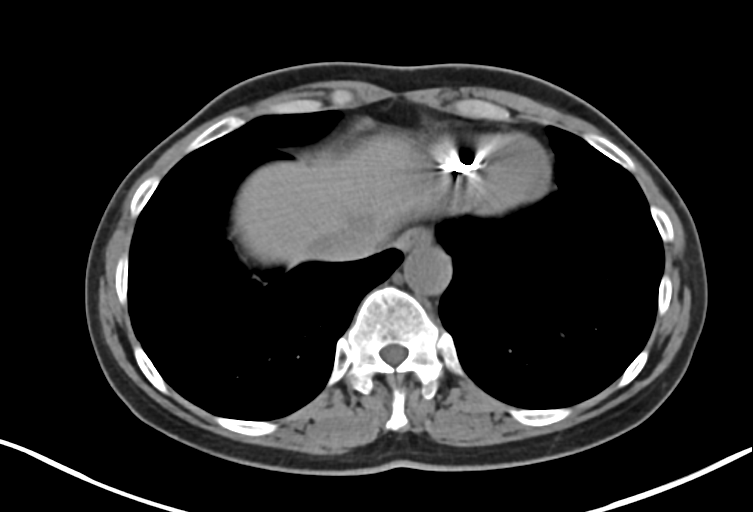

[Series 6: renal stone 2.00 br40 s3 cor · coronal · 0.70mm/px · 3 of 121 slices shown]
[im 41/121  soft-tissue]
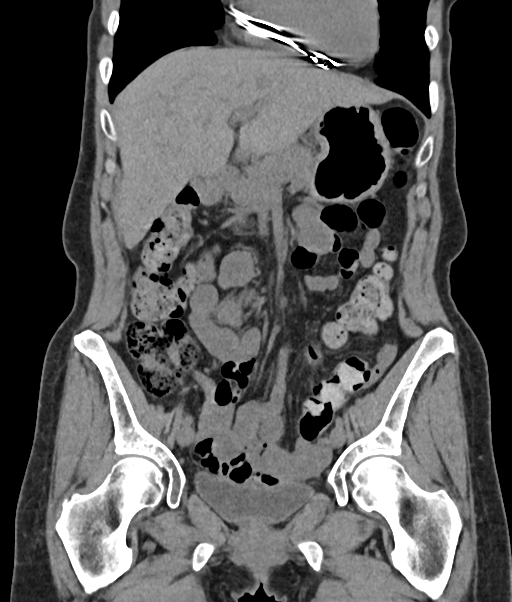
[im 54/121  soft-tissue]
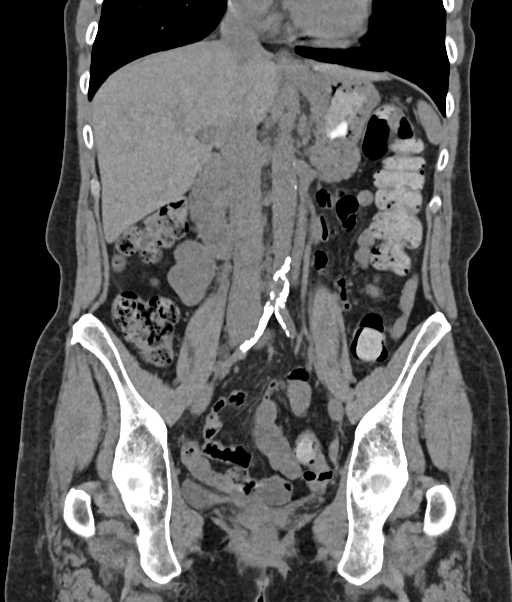
[im 67/121  soft-tissue]
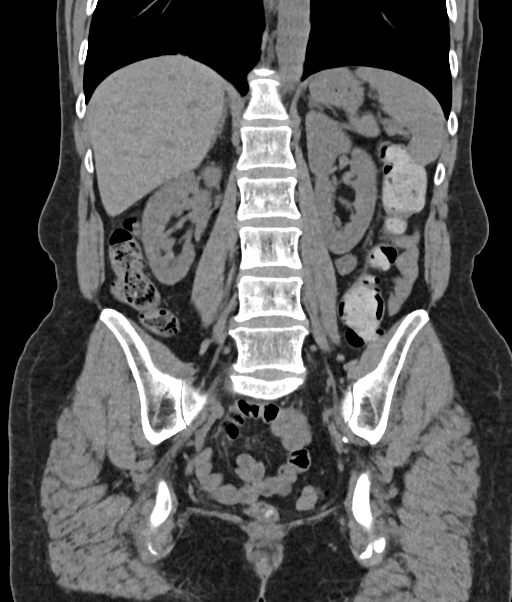

[11 of 46 positions shown; findings below may reference images not displayed]

FINDINGS: Lower chest: Mild centrilobular emphysematous changes noted in the
lung bases. Punctate calcified granuloma partially visualized 4 mm
nodule in the posterior right lower lobe ([DATE]). Additional punctate
2 mm nodule in the left lung base, possibly calcified ([DATE]). No
basilar consolidation. Pacer leads noted at the cardiac apex. Normal
cardiac size. No pericardial effusion.

Hepatobiliary: No focal liver abnormality is seen. Patient is post
cholecystectomy. Slight prominence of the biliary tree likely
related to reservoir effect. No calcified intraductal gallstones.

Pancreas: Unremarkable. No pancreatic ductal dilatation or
surrounding inflammatory changes.

Spleen: Normal in size without focal abnormality.

Adrenals/Urinary Tract: Adrenal glands are unremarkable. Kidneys are
normal, without visible or contour deforming renal lesion,
urolithiasis or hydronephrosis. Urinary bladder is largely
decompressed at the time of exam and therefore poorly evaluated by
CT imaging. Mild bladder wall thickening may be related to
underdistention.

Stomach/Bowel: Distal esophagus, stomach and duodenal sweep are
unremarkable. No small bowel wall thickening or dilatation. No
evidence of obstruction.

Vascular/Lymphatic: Extensive atherosclerotic calcification of the
abdominal aorta and iliac arteries. No aneurysm or ectasia. No
suspicious or enlarged lymph nodes in the included lymphatic chains.

Reproductive: Uterus is surgically absent. No concerning adnexal
lesions.

Other: No abdominopelvic free fluid or free gas. No bowel containing
hernias.

Musculoskeletal: Multilevel degenerative changes are present in the
imaged portions of the spine. No acute osseous abnormality or
suspicious osseous lesion.
IMPRESSION: 1. No urolithiasis or hydronephrosis.
2. Mild bladder wall thickening, possibly related to underdistention
though could correlate with urinalysis and patient's symptoms to
exclude urinary tract infection.
3. Few tiny pulmonary nodules measuring up to 4 mm. No routine
follow-up needed if patient is low-risk (and has no known or
suspected primary neoplasm). Non-contrast chest CT can be considered
in 12 months if patient is high-risk. This recommendation follows
the consensus statement: Guidelines for Management of Incidental
Pulmonary Nodules Detected on CT Images: From the [HOSPITAL]
4. Aortic Atherosclerosis (8BAH1-R6Q.Q)
5. Emphysema (8BAH1-Z7Q.M).

## 2021-05-20 NOTE — Telephone Encounter (Signed)
Attempted to contact patient about sending remote transmission. No answer, LMTCB.

## 2021-05-20 NOTE — Telephone Encounter (Signed)
Patient called in returning call, I let patient know we need a remote transmission. Patient is sending now and I let her know a nurse will call her as soon as we get it

## 2021-05-20 NOTE — Telephone Encounter (Signed)
Remote transmission received and reviewed. Normal presenting rhythm, AP/VS 70 bpm. No AF seen on device. Numerous noise reversion which is not a new finding. Leads appears stable given values provided. Reviewed transmission and symptoms with A. Tillery, PA and agrees device function is normal.   Patient expresses concern that she feels symptoms and sometimes having to work from home and feels like no one is listening to her. States since she got her apple watch she feels as though her symptoms have worsened. Explained to patient that PVC's can cause the AF alert to trigger on apple watch. Offered understanding and reassurance to patient during conversation. Explained that I would make sure this is forward to Dr. Graciela Husbands for review and see what recommendations he has? Forwarding also back to Dr. Koren Bound covering RN in the mean time since device shows normal function.

## 2021-05-20 NOTE — Telephone Encounter (Signed)
Transmission received. The patient states the nurse can call her back after she reviews it.

## 2021-06-06 ENCOUNTER — Other Ambulatory Visit: Payer: Self-pay

## 2021-06-06 ENCOUNTER — Ambulatory Visit
Admission: RE | Admit: 2021-06-06 | Discharge: 2021-06-06 | Disposition: A | Discharge status: Home / Self Care | Payer: BC Managed Care – PPO | Source: Ambulatory Visit | Attending: Family Medicine | Admitting: Family Medicine

## 2021-06-06 DIAGNOSIS — R2231 Localized swelling, mass and lump, right upper limb: Secondary | ICD-10-CM

## 2021-06-06 DIAGNOSIS — N6489 Other specified disorders of breast: Secondary | ICD-10-CM | POA: Diagnosis not present

## 2021-06-06 DIAGNOSIS — R922 Inconclusive mammogram: Secondary | ICD-10-CM | POA: Diagnosis not present

## 2021-06-20 ENCOUNTER — Other Ambulatory Visit: Payer: Self-pay | Admitting: Family Medicine

## 2021-06-20 DIAGNOSIS — G4700 Insomnia, unspecified: Secondary | ICD-10-CM

## 2021-06-21 ENCOUNTER — Other Ambulatory Visit (HOSPITAL_COMMUNITY)
Admission: RE | Admit: 2021-06-21 | Discharge: 2021-06-21 | Disposition: A | Discharge status: Home / Self Care | Payer: BC Managed Care – PPO | Source: Ambulatory Visit | Attending: Family Medicine | Admitting: Family Medicine

## 2021-06-21 ENCOUNTER — Other Ambulatory Visit: Payer: Self-pay | Admitting: Family Medicine

## 2021-06-21 ENCOUNTER — Other Ambulatory Visit: Payer: Self-pay

## 2021-06-21 ENCOUNTER — Ambulatory Visit (INDEPENDENT_AMBULATORY_CARE_PROVIDER_SITE_OTHER): Payer: BC Managed Care – PPO | Admitting: Family Medicine

## 2021-06-21 ENCOUNTER — Encounter: Payer: Self-pay | Admitting: Family Medicine

## 2021-06-21 VITALS — BP 100/62 | HR 70 | Temp 98.6°F | Ht 66.0 in | Wt 124.9 lb

## 2021-06-21 DIAGNOSIS — E785 Hyperlipidemia, unspecified: Secondary | ICD-10-CM

## 2021-06-21 DIAGNOSIS — Z124 Encounter for screening for malignant neoplasm of cervix: Secondary | ICD-10-CM | POA: Insufficient documentation

## 2021-06-21 DIAGNOSIS — G4700 Insomnia, unspecified: Secondary | ICD-10-CM

## 2021-06-21 DIAGNOSIS — I951 Orthostatic hypotension: Secondary | ICD-10-CM

## 2021-06-21 DIAGNOSIS — F419 Anxiety disorder, unspecified: Secondary | ICD-10-CM | POA: Diagnosis not present

## 2021-06-21 DIAGNOSIS — Z0000 Encounter for general adult medical examination without abnormal findings: Secondary | ICD-10-CM

## 2021-06-21 MED ORDER — ZOLPIDEM TARTRATE 10 MG PO TABS: 10.0000 mg | ORAL_TABLET | Freq: Every evening | ORAL | 5 refills | Status: DC | PRN

## 2021-06-21 NOTE — Progress Notes (Signed)
Dana Harvey DOB: [birth year] Encounter date: 06/21/2021  This is a 60 y.o. female who presents for complete physical   History of present illness/Additional concerns: Doing pretty well overall. Son still homeless.   Working on managing anxiety a little better; still needing medication.   Dr. Graciela Husbands put her on midodrine but it sent her into tachycardia, and she felt terrible. HR was showing in A fib on watch; in 160; bp was in the 150-160's.   Still using the ambien 10mg  which does work for her.     Past Medical History:  Diagnosis Date   Anxiety    Chest pain at rest    normal cors on cath in 2012. Normal coronary CT in 2018. Normal stress test in 05/2019   CVA (cerebral vascular accident) (HCC) 1997   Depression    Diverticulitis    Dyspnea    previous PFT showed normal lung volumes, severe diffusion defect with DLCO 43% predicted   Fainting spell    Frequent headaches    Gastroparesis    GERD (gastroesophageal reflux disease)    Heart murmur    Insomnia    Insomnia    Migraines    Pacemaker    St Jude dual chamber PPM placed on 09/06/2014 by Dr. 13/08/2014 in [place]   PAF (paroxysmal atrial fibrillation) [facility])    diagnosed by [facility]. [facility], remote history, no recurrent   Polyp of intestine 2018   benign per patient   Seizures (HCC)    secondary to potassium level dropping (due to opioid medications per patient)   Thyroid disease    Past Surgical History:  Procedure Laterality Date   ABDOMINAL HYSTERECTOMY  1993   heavy vaginal bleeding; later went in and removed Right ovary   APPENDECTOMY  1984   ATRIAL ABLATION SURGERY  2012   BREAST EXCISIONAL BIOPSY     bilateral; benign   CARDIAC CATHETERIZATION  2012   normal cors on cath in [facility] tx   CESAREAN SECTION     [phone]   CHOLECYSTECTOMY  2010   ELECTROPHYSIOLOGY STUDY  around 2013   with ablation for clinically suspected inappropriate sinus tachycardia, by Dr. 2014   LOOP RECORDER  IMPLANT  2013   by Dr. 2014   PACEMAKER INSERTION  09/06/2014   St Jude Assurity dual chamber PPM, Dr 13/08/2014 [place]--per patient; put in after ablation   Allergies  Allergen Reactions   Nystatin Anaphylaxis   Penicillins Anaphylaxis   Aspirin Tinitus   Crestor [Rosuvastatin Calcium]    Fleet Enema [Enema]     vomiting   Midodrine Hcl     Tachycardia per patient   Miralax [Polyethylene Glycol]     Vomiting, patient states this causes a-fib and leads to syncope   Motegrity [Prucalopride] Other (See Comments)    Made her have bad thoughts of harming herself   Current Meds  Medication Sig   ALPRAZolam (XANAX) 0.5 MG tablet Take 1 tablet (0.5 mg total) by mouth 2 (two) times daily as needed for anxiety.   ascorbic acid (VITAMIN C) 500 MG tablet    atorvastatin (LIPITOR) 80 MG tablet TAKE 1 TABLET BY MOUTH EVERY DAY   cholecalciferol (VITAMIN D3) 25 MCG (1000 UNIT) tablet    pantoprazole (PROTONIX) 40 MG tablet Take 2 tablets (80 mg total) by mouth daily.   traZODone (DESYREL) 100 MG tablet Take 100 mg by mouth at bedtime.   Vitamin E 180 MG (400 UNIT) CAPS  zolpidem (AMBIEN) 10 MG tablet Take 1 tablet (10 mg total) by mouth at bedtime as needed for sleep.   Social History   Tobacco Use   Smoking status: Never   Smokeless tobacco: Never   Tobacco comments:    Per patient previous info is not correct-resides with a smoker  Substance Use Topics   Alcohol use: Never   Family History  Problem Relation Age of Onset   Osteoarthritis Mother    Alcohol abuse Mother    Arthritis Mother    COPD Mother    Depression Mother    Hearing loss Mother    Hyperlipidemia Mother    Hypertension Mother    Cancer Father    Diabetes Father    Heart disease Father [id number]       CABG   Hypertension Father    Alcohol abuse Brother    Cancer Brother    Depression Brother    Drug abuse Brother    Alcohol abuse Son    Drug abuse Son    Early death Maternal Grandmother [id number]   Heart disease  Maternal Grandmother    Alcohol abuse Maternal Grandfather    Heart disease Maternal Grandfather [id number]   Cancer Paternal Grandmother    Heart disease Paternal Grandmother    Cancer Paternal Grandfather    Heart disease Paternal Grandfather    Alcohol abuse Brother    Depression Brother    Diabetes Brother    Drug abuse Brother    Hypertension Brother    Hyperlipidemia Brother      Review of Systems  Constitutional:  Negative for activity change, appetite change, chills, fatigue, fever and unexpected weight change.  HENT:  Negative for congestion, ear pain, hearing loss, sinus pressure, sinus pain, sore throat and trouble swallowing.   Eyes:  Negative for pain and visual disturbance.  Respiratory:  Negative for cough, chest tightness, shortness of breath and wheezing.   Cardiovascular:  Negative for chest pain, palpitations (she has felt much better off of the midodrine) and leg swelling.  Gastrointestinal:  Negative for abdominal pain, blood in stool, constipation, diarrhea, nausea and vomiting.  Genitourinary:  Negative for difficulty urinating and menstrual problem.  Musculoskeletal:  Negative for arthralgias and back pain.  Skin:  Negative for rash.  Neurological:  Negative for dizziness, weakness, numbness and headaches.  Hematological:  Negative for adenopathy. Does not bruise/bleed easily.  Psychiatric/Behavioral:  Negative for suicidal ideas. Agitation: has had some improvement in mood overall; just not letting stressors get to her. Sleep disturbance: does well with the ambien 10mg .The patient is not nervous/anxious.    CBC:  Lab Results  Component Value Date   WBC 9.8 04/19/2021   HGB 13.8 04/19/2021   HGB 13.7 10/29/2020   HCT 40.7 04/19/2021   HCT 40.6 10/29/2020   MCH 32.2 04/19/2021   MCHC 33.9 04/19/2021   RDW 12.9 04/19/2021   RDW 12.1 10/29/2020   PLT 218 04/19/2021   PLT 202 10/29/2020   MPV 10.7 08/15/2020   CMP: Lab Results  Component Value Date   NA  141 04/26/2021   K 4.2 04/26/2021   CL 104 04/26/2021   CO2 23 04/26/2021   ANIONGAP 8 04/19/2021   GLUCOSE 95 04/26/2021   GLUCOSE 84 04/19/2021   BUN 10 04/26/2021   CREATININE 0.66 04/26/2021   CREATININE 0.69 08/15/2020   GFRAA 93 10/29/2020   GFRAA 111 08/15/2020   CALCIUM 9.9 04/26/2021   PROT 6.7 04/19/2021   BILITOT 0.6 04/19/2021  ALKPHOS 79 04/19/2021   ALT 19 04/19/2021   AST 19 04/19/2021   LIPID: Lab Results  Component Value Date   CHOL 182 08/15/2020   TRIG 98 08/15/2020   HDL 51 08/15/2020   LDLCALC 111 (H) 08/15/2020    Objective:  BP 100/62 (BP Location: Left Arm, Patient Position: Sitting, Cuff Size: Normal)   Pulse 70   Temp 98.6 F (37 C) (Oral)   Ht 5\' 6"  (1.676 m)   Wt 124 lb 14.4 oz (56.7 kg)   SpO2 96%   BMI 20.16 kg/m   Weight: 124 lb 14.4 oz (56.7 kg)   BP Readings from Last 3 Encounters:  06/21/21 100/62  04/26/21 124/74  04/19/21 (!) 141/87   Wt Readings from Last 3 Encounters:  06/21/21 124 lb 14.4 oz (56.7 kg)  04/26/21 122 lb (55.3 kg)  04/19/21 123 lb 3.8 oz (55.9 kg)    Physical Exam Exam conducted with a chaperone present.  Constitutional:      General: She is not in acute distress.    Appearance: She is well-developed.  HENT:     Head: Normocephalic and atraumatic.     Right Ear: External ear normal.     Left Ear: External ear normal.     Mouth/Throat:     Pharynx: No oropharyngeal exudate.  Eyes:     Conjunctiva/sclera: Conjunctivae normal.     Pupils: Pupils are equal, round, and reactive to light.  Neck:     Thyroid: No thyromegaly.  Cardiovascular:     Rate and Rhythm: Normal rate and regular rhythm.     Heart sounds: Normal heart sounds. No murmur heard.   No friction rub. No gallop.  Pulmonary:     Effort: Pulmonary effort is normal.     Breath sounds: Normal breath sounds.  Abdominal:     General: Bowel sounds are normal. There is no distension.     Palpations: Abdomen is soft. There is no mass.      Tenderness: There is no abdominal tenderness. There is no guarding.     Hernia: No hernia is present.  Genitourinary:    Exam position: Supine.     Vagina: Normal.     Uterus: Absent.      Comments: atrophy Musculoskeletal:        General: No tenderness or deformity. Normal range of motion.     Cervical back: Normal range of motion and neck supple.  Lymphadenopathy:     Cervical: No cervical adenopathy.  Skin:    General: Skin is warm and dry.     Findings: No rash.  Neurological:     Mental Status: She is alert and oriented to person, place, and time.     Deep Tendon Reflexes: Reflexes normal.     Reflex Scores:      Tricep reflexes are 2+ on the right side and 2+ on the left side.      Bicep reflexes are 2+ on the right side and 2+ on the left side.      Brachioradialis reflexes are 2+ on the right side and 2+ on the left side.      Patellar reflexes are 2+ on the right side and 2+ on the left side. Psychiatric:        Speech: Speech normal.        Behavior: Behavior normal.        Thought Content: Thought content normal.    Assessment/Plan: Health Maintenance Due  Topic Date Due  Zoster Vaccines- Shingrix (2 of 2) 06/07/2021   PAP SMEAR-Modifier  05/07/2021   INFLUENZA VACCINE  05/27/2021   Health Maintenance reviewed.  1. Preventative health care She is doing better with stress management. We have discussed regular activity to keep up bone density, overall health.   2. Anxiety Still with some occasional xanax use; she would like to taper off of this in the next year.  3. Insomnia, unspecified type Does well with ambien 10mg . She has tried lower dose XR without relief, she has tried trazodone which helps, but does not work alone.   4. Hyperlipidemia, unspecified hyperlipidemia type Follows with cardiology - she takes lipitor 80mg .  5. Orthostatic intolerance She is following with cardiology. She is feeling better off of the midodrine.   6. Cervical cancer  screening - PAP []  Return in about 6 months (around 12/22/2021) for Chronic condition visit.  , MD

## 2021-06-26 LAB — CYTOLOGY - PAP
Comment: NEGATIVE
Diagnosis: NEGATIVE
High risk HPV: NEGATIVE

## 2021-07-09 ENCOUNTER — Other Ambulatory Visit: Payer: Self-pay

## 2021-07-09 ENCOUNTER — Telehealth: Payer: Self-pay | Admitting: Gastroenterology

## 2021-07-09 DIAGNOSIS — R1012 Left upper quadrant pain: Secondary | ICD-10-CM

## 2021-07-09 DIAGNOSIS — R110 Nausea: Secondary | ICD-10-CM

## 2021-07-09 DIAGNOSIS — K3184 Gastroparesis: Secondary | ICD-10-CM

## 2021-07-09 DIAGNOSIS — R142 Eructation: Secondary | ICD-10-CM

## 2021-07-09 NOTE — Telephone Encounter (Signed)
Hi Dr. Orvan Falconer, this pt was scheduled for an EGD back in March but cancelled because she was feeling better. She saw Gunnar Fusi prior to scheduling EGD. She would like to r/s EGD because sxs have returned. Does she need an ov first or can I r/s EGD? Thank you.

## 2021-07-09 NOTE — Telephone Encounter (Signed)
Called pt and informed an appt is not required per Dr. Orvan Falconer. Offered 07/10/21 but pt declined stating, "that's too soon". Pt agreed to be scheduled on 07/22/21 @ 8am with arrival of 7am. Amb referral placed and sent pt prep instructions via My Chart as below:  Letter by Deon Pilling, LPN on 5/78/4696      [facility] [address] [place], [place]  [address] Phone:  [phone]   Fax:  [phone]      Dana Harvey                          [id number] 09-Feb-1961   Procedure Date: 07/22/21 Arrival Time: 7am Procedure Time: 8am   Procedure Location:[facility] (4th Floor)                                    [address] [place], [place] [address]   Check in with the receptionist on the 4th floor when you arrive  ____________________________________________________________________________   PREPARATION FOR ENDOSCOPY   On 07/22/21 THE DAY OF THE PROCEDURE:   1. No solid foods, milk or milk products are allowed after midnight the night before your procedure.   2. You may drink clear liquids until 5am, which is 3 hours before your procedure.    3. DO NOT drink anything colored red or purple. Avoid juices with pulp. NO orange juice. NO Milk products.    You should not have any gum, water, medicine or candy after this 3 hour stop time.    Clear liquids include:  NO RED & NO PURPLE  Water Jello  Ice Popsicles  Tea (sugar ok, no milk/cream) Powdered fruit flavored drinks  Coffee (sugar ok, no milk/cream) Gatorade  Juice: apple, white grape, white cranberry Lemonade, Kool-Aid  Clear bullion,broth (vegetable,chicken,beef) Carbonated beverages (any kind)  Strained chicken noodle soup  (no noodles or chicken) Hard Candy    Stop drinking all liquids including water, no gum, no candy or medications after 5am _______________________________________________________________________________   MEDICATION  INSTRUCTIONS   Unless otherwise instructed, you should take regular prescription medications with a small sip of water as early as possible the morning of your procedure. You may continue taking your Aspirin.   Take allowed medicines by 5am  ____________________________________________________________________________________   CARE PARTNER   You will need 1 responsible adult at least 59 years of age to act as your care partner.  This person needs to arrive with you ,stay here during your procedure in the 4th floor lobby or parking lot, and drive you home. Keep their cell phone available for our call. We cannot start your procedure unless your care partner is present in our facility. The total time from sign in until discharge is approximately 2-3 hours.     COVID  Due to the COVID-19 pandemic we are asking patients to follow these guidelines.  Everyone entering the building has to wear a mask the whole time.  They need to practice social distancing.    WHAT TO WEAR/BRING   Wear loose fitting clothing that is easily removed.   Do not wear any lotions, perfumes or colognes. You may wear deodorant.  Leave jewelry and  other valuables at home. However, you may wish to bring a book to read.  Your belongings will be placed under your stretcher.  Remove all body piercing jewelry and leave at home.   Cell phones ,Smart Watches, Fit Bits or any other electronic devices cannot be used in patient care areas/ recovery room.  If you use any type of inhaler please bring them with you.   ACTIVITY   Your Care Partner should plan to take you directly home after your procedure.  You should plan to take it easy, moving slowly for the rest of the day. DO NOT DRIVE ,use heavy machinery, DRINK ANY ALCOHOL  and do not return to work.  You can resume all normal activity the following day.     CANCELLATION POLICY We require 2 full business days advance notice for non-emergent cancellations of any procedure.  Failure to give this notice may result in a fee:   $100 for a single procedure (upper or lower endoscopy) $200 for a double procedure (upper and lower endoscopy) ___________________________________________________________________   [facility] Reasons Your Endoscopic Procedure May Be DELAYED OR CANCELLED:   If NO designated "Care Partner" with you who will remain in the lobby the entire time of your procedure and recovery.     IF YOU DO NOT FOLLOW YOUR PROCEDURE INSTRUCTIONS. Drinking liquids of any kind in any amount WITHIN 3 HOURS of your scheduled procedure, or eating ANY SOLID FOODS the day of your procedure will result in cancellation of your procedure.     If during your pre-procedure assessment you have any abnormal respiratory or cardiac problems, shortness of breath, irregular heart rhythm, a low baseline oxygenation level, a dangerously high or low baseline blood pressure level or a high or low blood sugar reading.   If you have a FEVER or any new cold or flu symptoms contact us immediately.    IF YOU HAVE ANY ER OR DOCTOR VISIT FOR AN ILLNESS, ESPECIALLY ANY HEART OR LUNG RELATED ISSUES, AFTER YOU HAVE YOUR GI VISIT, PLEASE CONTACT OUR OFFICE AT [phone]   __________________________________________________________________ Please use MYCHART to communicate with GI providers for non-urgent requests or questions. Please allow 48 business hours for a response. __________________________________________________________________   FREQUENTLY ASKED QUESTIONS    WHAT TYPES OF SEDATION ARE USED AT THE L[facility]? There are two types of sedation.  Your gastroenterologist will decide which type you will need based on your personal medical issues as well as their own preference.  An IV of Normal Saline is required.   Moderate Sedation is rarely used. If you need this sedation, your doctor will discuss this with you.   Deep Sedation (Monitored Anesthesia Care) is achieved using a  short acting IV anesthetic (diprivan, Propofol) that promotes relaxation and sleep.  This medicine is administered by a CRNA (nurse anesthetist) skilled and credentialed in using diprovan.  You should not remember the procedure itself, but you should be able to remember the discharge instructions and the discussion with your physician afterwards.  You will receive a separate bill for this type of sedation.  This type of sedation is generally more reliable for patients who take certain chronic medications or with certain medical conditions and so it may be preferred. If you have any questions about the type of sedation that will be used for your procedure, your gastroenterologist will be happy to discuss it with you. WHY DO I NEED A DRIVER?  The medicines used for your sedation cause delayed reflexes, impair  thinking and judgment, and have some amnesic effect, therefore affecting your ability to drive safely. Even though you may feel alright, you are instructed to refrain from driving, operating any type of machinery, making any critical decisions, or signing any legal documents until the following day.  ___________________________________________________________________ CAN I WEAR DENTURES OR HEARING AIDES?  Yes, you may wear your dentures OR hearing aides. However, you may be asked to remove them prior to your procedure.  CAN I  WEAR MY CONTACTS?  We advise that you leave your contact lenses at home and wear your glasses instead. If you do wear you lenses, you may be asked to remove them prior to your procedure so please bring a case for them and also a pair of glasses to wear after your procedure.  IS THE TEST SAFE DURING MY MENSTRUAL PERIOD?  Yes, your procedure can still be performed.  WILL THE DOCTOR TALK WITH ME AFTERWARDS? Yes, your physician will review the findings, follow-up care instructions, and treatment recommendations with you.  This will also be reviewed with your care partner if you have  explicitly given [place] permission.  It is important that your care partner realizes you may not remember much after the procedure due to the effects of the anesthesia and so they will need to be able to review this information with you later. HOW LONG WILL I BE THERE? The whole process takes 2-3 hours.  Most of that time is spent checking you in and waking you up safely after the procedure.  The endoscopy  itself takes 10-15 minutes usually.   Obviously, there may be unforseen delays and we will appreciate your patience if that occurs. WHAT TO EXPECT AFTER THE PROCEDURE?  Some feelings of bloating and passage of more gas is normal.  Walking can help get rid of the air and reduce the bloating.  WHAT CAN I EAT AFTER THE PROCEDURE?  We do recommend a small meal at first , but then you may proceed to your regular diet.  Drink plenty of fluids but avoid alcohol for 24 hours. WHAT IF THE WEATHER IS TERRIBLE?  Please CONTACT OUR OFFICE AT [phone] WHEN SEVERE WEATHER IS FORECAST so we may advise you of any delays or closures. [facility] news will broadcast our weather closings and updates. We will make every effort to contact you by phone or [facility] if the [facility] is going to close or have a delayed opening due to weather.    SIGNATURES/CONFIDENTIALITY You have signed paperwork which will be entered into your electronic medical record.  This attests to the fact that that the information above has been reviewed and is understood.  Full responsibility of the confidentiality lies with you and/or your care-partner. _______________________________________________________________________________________   [place] FINANCIAL RESPONSIBILITY If you have insurance, you will need to contact your insurance company to verify that you have active coverage and to determine the amount of coverage they will provide. It is important to tell them that you are having the procedure performed at an [facility]). They  can tell you what portion of the cost will be your responsibility, usually expressed either as a fixed amount or as a percentage of overall cost. We will also be contacting your insurance company with information about your procedure in order to obtain pre-certification.  You must contact your insurance company as well, failure to do so may result in you having to pay a greater portion of the cost or even the total cost of  the procedure.   YOU MAY RECEIVE THE FOLLOWING BILLS The [facility] will bill a facility fee for use of the procedure room, medication and supplies. Your [place] gastroenterologist will bill a professional fee for performing the procedure. If a biopsy is performed you will receive a bill from [facility] for their professional fee and a bill from [facility] for processing the pathology sample. If you receive diprivan for sedation during your procedure, you will receive a bill from [facility] for the administration of the medication.   Complaints or questions regarding billing, payment by third party payers or payment plans can be directed to the Customer Service Department of Professional Fee Billing Services of the [facility], [address], [address], [place], [place] [address].  Phone inquiries may be made at [facility] health [phone], Monday through Friday 8am to 5 p.m., or you may visit the [facility] website at: [email or web address], click on "For Patients", then click "Questions about your bill.   [facility] ([facility])                                The  Endoscopy Center (LEC) is an independent, freestanding Ambulatory Surgery Center (ASC) located on the fourth floor of the [facility] at [address] [place], [place].  It is licensed by the [place] [facility] [place], certified by [employer] and is accredited by [employer].    The [facility] physicians established the [facility] in 1992. The physicians of [facility] joined the [facility] in 1999, and the LEC is now owned by [employer].  We completed a major renovation in 2006 and the expanded LEC now provides greater privacy and comfort for our patients and their family members.  We have invested in state-of-the-art facilities and equipment to ensure that our patients receive the most up to date and best care.   At the [facility], board-certified gastroenterologists perform elective diagnostic and therapeutic endoscopic procedures such as endoscopy and colonoscopy.  Hours of operation are from 7:00 a.m. to 5:30 p.m. Monday - Friday.  Outside of the posted hours of operation, urgent or emergent care is provided at [facility].  Our physicians also provide 24-hour emergency coverage.  After hours and on weekends, the on-call physician may be reached by calling [phone].  The answering service will take a message and have the physician on call contact you.   Your Rights and Responsibilities as Our Patient  This center is owned by [employer] and is governed by the Gastroenterologists of [employer]. You may exercise the following rights without being subjected to discrimination or reprisal.                                     PATIENT RIGHTS- You have a right to : * Considerate, respectful, and safe care that is free from abuse or harassment.  * A discussion of your illness, what we can do about it, and the likely outcome of care.  * Know the names and roles of the people caring for you here.  * Respectful and effective pain management.  * Receive as much information to consent to or refuse a course of treatment or invasive procedure and to actively participate in decisions regarding your medical care.  *  Involve your health care proxy or significant others in the decision making process for medical decisions.  * Reasonable continuity of care  and to know in advance the time and location of an appointment as well as the doctor you are seeing.  * Full consideration of personal privacy and confidentiality of your medical information. Your written permission will be obtained prior to releasing any medical information. When we do release your information to others, we ask them to keep them confidential.  * Review your medical record and ask questions unless restricted by law.  * Know of any relationships with other parties that may influence your care.  * Know about rules that affect your care and about charges and payment methods. You have a right to receive and examine an explanation of your bill regardless of the source of payment.  * Receive assistance with the transfer of care from one doctor to another doctor within our practice or to an external doctor not in our practice.  * You have a right to develop a living will or healthcare power of attorney although, since the procedures that we do are not high risk, we will do all that is necessary to stabilize you including CPR if an emergency occurs. EMS will be called and you will be transferred to the hospital.  * Voice your concerns, complaints, or problems with the care you received by contacting our manager at [phone] or Nurse Manager at [phone]. If we are unable to satisfactorily address your complaint, you may contact the [facility] Board by phone at [phone], [facility] by phone at [phone] or online at [email or web address].ee, or the [facility], The Complaint Intake Unit by phone at [phone] or [phone], by mail at [facility], [place], [place], or online at [email or web address].                        PATIENT RESPONSIBILITIES-You agree to :  * Provide accurate and complete information concerning your symptoms, past history, current health status, and medications including over-the-counter products and dietary supplements.  * Make known  whether you clearly comprehend your medical care and what is expected of you in the plan of care.  * Participate in the development of the treatment plan and follow care instructions given to you.  * Follow the treatment plan and care instructions given to you.  * Keep appointments and notify us if you are unable to do so.  * Accept responsibility for your actions if you refuse planned treatment or do not follow your doctor's orders.  * Accept financial responsibility for care received and pay promptly.  * Follow facility policies and procedures.  * Inform my doctor about any living will, medical healthcare power of attorney, or other directive that may affect my medical care.  * Be respectful of all healthcare providers and staff as well as other patients.  * Inform the staff of any discomfort or pain and patient safety issues.  * Share your values, beliefs, and traditions to help the staff provide appropriate care.  * Provide a responsible adult to transport you home and remain with you if you receive sedation medications.                                 Additional Information for Medicare Patients  All issues, concerns, or complaints can be reported by contacting our Electrical engineer. If  we are unable to address your concerns, you may contact the following for assistance.  1. Medicare Ombudsman CrisisRepair.gl.asp  [place] Ombudsman - Aging and Adult Services [phone] http://[email or web address].dhhs.state.[place].[email or web address]/aging [email or web address]    Advance Directives - Living Will or Health Care Power of Attorney Resources  For applicable state laws and sample forms for creating a living will or healthcare power of attorney, you may contact one of the following.  1. Caring Information Organization at 724-599-0741 for English or 779-722-0859 for other languages or www.caringinfo.org 2. [place] DHHS Division of Aging and Adult Services at 509-417-4294 or  www.dhhs.state.[place].us/aging  or          ThemeManagement.no  [place] - AccommodationsBlog.se.pdf           LuxuryExpense.at.pdf 3. The [facility] at [phone] or [phone] or www.cchospice.org   Advance Directive Policy:  Please be aware that the procedures that we do in this facility are not high risk and that in an emergency, we will do all that is necessary to stabilize you including Basic Life Support (BLS) and Advanced Cardiac Life Support (ACLS).   If you present to this center for a procedure with a living will or valid Do Not Resuscitate Order (DNR) or Out of Facility form and you have an emergency, we will do all that we can to stabilize your medical condition and we may begin Basic Life Support (BLS) and Advanced [place] Life Support (ACLS). We will call 911 to transport you to the hospital. EMS will be informed of the Do Not Resuscitate Order or living will upon arrival.                                             COMPLAINT/GRIEVANCE PROCESS The LEC recognizes that patients have the right to voice concerns without fear of discrimination or reprisal, and to have these concerns reviewed and responded to in a timely manner. LEC seeks to provide prompt review and timely resolution of complaints or grievances from any patient.     You may voice your concerns, complaints, or problems with the care you have received or are receiving to any staff member at any point in your care.  Every effort will be made to reconcile your concern or complaint while you are still in the LEC.  However, if you wish to voice a concern or complaint after you have left the center, you may contact the Nursing Supervisor at [phone] or our Administrative Director of Gastroenterology at [phone].  If we are unable to satisfactorily address your  complaint, you may contact the [facility] Department of [facility], Complaint Intake Unit ([address] [place], [address] Mail Service [place], [place], [place] [address], phone: [phone] or [phone]) or by e-mail at [email or web address].  You may also contact the Office of the Medicare Ombudsman to file a grievance at 800 MEDICARE (800 785-627-0077) or at [email or web address]

## 2021-07-11 ENCOUNTER — Telehealth: Payer: Self-pay | Admitting: Nurse Practitioner

## 2021-07-11 MED ORDER — SCOPOLAMINE 1 MG/3DAYS TD PT72: 1.0000 | MEDICATED_PATCH | TRANSDERMAL | 2 refills | Status: DC

## 2021-07-11 NOTE — Telephone Encounter (Signed)
Spoke with patient in regards to recommendations. Prescription sent to pharmacy on file, pt confirmed. Advised that next step in her evaluation will be EGD. Pt verbalized understanding of all information and had no concerns at the end of the call.

## 2021-07-11 NOTE — Telephone Encounter (Signed)
Patient called said she is having a lot of nausea could not work today because of it seeking advise.

## 2021-07-11 NOTE — Telephone Encounter (Signed)
Spoke with patient, she states that she had to leave work again today due to severe nausea. Pt states that the nausea has "taken over her life". She states that she has been taking her co-workers Zofran 8 mg which helps for a few minutes. She states that she has been trying to eat light foods like Jello because nothing else settles with her stomach. She states that she developed loose stools 2 days ago. She reports that she woke up at 5:30 am on Monday and was in the restroom for 1.5 hours. Pt reports that she does have lower abdominal pain which is probably from the diarrhea and nausea. Denies a fever, she states that she checks her temperature every morning before work and has not had anything higher than a 99.1. Pt reports that she is taking Protonix 80 mg BID and Zofran 8 mg when she gets to work because her "stomach is on fire". Pt is already scheduled for an EGD with Dr. Orvan Falconer on Monday, 07/22/21 at 8 am. Please advise, thanks.

## 2021-07-15 ENCOUNTER — Encounter: Payer: Self-pay | Admitting: Family Medicine

## 2021-07-22 ENCOUNTER — Other Ambulatory Visit: Payer: Self-pay

## 2021-07-22 ENCOUNTER — Telehealth: Payer: Self-pay

## 2021-07-22 ENCOUNTER — Ambulatory Visit (AMBULATORY_SURGERY_CENTER): Payer: BC Managed Care – PPO | Admitting: Gastroenterology

## 2021-07-22 ENCOUNTER — Encounter: Payer: Self-pay | Admitting: Gastroenterology

## 2021-07-22 VITALS — BP 128/75 | HR 70 | Temp 98.7°F | Resp 15 | Ht 66.0 in | Wt 122.0 lb

## 2021-07-22 DIAGNOSIS — K3184 Gastroparesis: Secondary | ICD-10-CM

## 2021-07-22 DIAGNOSIS — R1012 Left upper quadrant pain: Secondary | ICD-10-CM | POA: Diagnosis not present

## 2021-07-22 DIAGNOSIS — R1084 Generalized abdominal pain: Secondary | ICD-10-CM

## 2021-07-22 DIAGNOSIS — R110 Nausea: Secondary | ICD-10-CM

## 2021-07-22 DIAGNOSIS — K449 Diaphragmatic hernia without obstruction or gangrene: Secondary | ICD-10-CM | POA: Diagnosis not present

## 2021-07-22 DIAGNOSIS — R142 Eructation: Secondary | ICD-10-CM | POA: Diagnosis not present

## 2021-07-22 MED ORDER — SODIUM CHLORIDE 0.9 % IV SOLN: 500.0000 mL | Freq: Once | INTRAVENOUS | Status: DC

## 2021-07-22 NOTE — Op Note (Signed)
[facility] Patient Name: Dana Harvey Procedure Date: 07/22/2021 8:12 AM MRN: [id number] Endoscopist: Tressia Danas MD, MD Age: 60 Referring MD:  Date of Birth: 04-Aug-1961 Gender: Female Account #: [id number] Procedure:                Upper GI endoscopy Indications:              Abdominal pain, Eructation, Nausea Medicines:                Monitored Anesthesia Care Procedure:                Pre-Anesthesia Assessment:                           - Prior to the procedure, a History and Physical                            was performed, and patient medications and                            allergies were reviewed. The patient's tolerance of                            previous anesthesia was also reviewed. The risks                            and benefits of the procedure and the sedation                            options and risks were discussed with the patient.                            All questions were answered, and informed consent                            was obtained. Prior Anticoagulants: The patient has                            taken no previous anticoagulant or antiplatelet                            agents. ASA Grade Assessment: III - A patient with                            severe systemic disease. After reviewing the risks                            and benefits, the patient was deemed in                            satisfactory condition to undergo the procedure.                           After obtaining informed consent, the endoscope was  passed under direct vision. Throughout the                            procedure, the patient's blood pressure, pulse, and                            oxygen saturations were monitored continuously. The                            Endoscope was introduced through the mouth, and                            advanced to the third part of duodenum. The upper                            GI endoscopy  was accomplished without difficulty.                            The patient tolerated the procedure well. Scope In: Scope Out: Findings:                 The examined esophagus was normal. The z-line is                            located 36 cm from the incisors. Biopsies were                            obtained from the proximal and distal esophagus                            with cold forceps for histology.                           A small hiatal hernia was present.                           The entire examined stomach was normal. Biopsies                            were taken from the antrum, body, and fundus with a                            cold forceps for histology. Estimated blood loss                            was minimal.                           The examined duodenum was normal. Biopsies were                            taken with a cold forceps for histology. Estimated  blood loss was minimal.                           The cardia and gastric fundus were normal on                            retroflexion.                           The exam was otherwise without abnormality. Complications:            No immediate complications. Estimated blood loss:                            Minimal. Estimated Blood Loss:     Estimated blood loss was minimal. Impression:               - Normal esophagus. Biopsied.                           - Small hiatal hernia.                           - Normal stomach. Biopsied.                           - Normal examined duodenum. Biopsied.                           - The examination was otherwise normal. Recommendation:           - Patient has a contact number available for                            emergencies. The signs and symptoms of potential                            delayed complications were discussed with the                            patient. Return to normal activities tomorrow.                            Written  discharge instructions were provided to the                            patient.                           - Resume previous diet.                           - Continue present medications.                           - Await pathology results.                           - Office follow-up with  Dr. Orvan Falconer or Willette Cluster - next available. Tressia Danas MD, MD 07/22/2021 8:34:10 AM This report has been signed electronically.

## 2021-07-22 NOTE — Telephone Encounter (Signed)
-----   Message from Tressia Danas, MD sent at 07/22/2021  8:29 AM EDT ----- Please arrange office follow-up with me or Gunnar Fusi - next available. Thanks.  KLB

## 2021-07-22 NOTE — Progress Notes (Signed)
Pt in recovery with monitors in place, VSS. Report given to receiving RN. Bite guard was placed with pt awake to ensure comfort. No dental or soft tissue damage noted. 

## 2021-07-22 NOTE — Telephone Encounter (Signed)
Per Dr. Orvan Falconer request, f/u appt scheduled. Letter mailed. Appt printed on AVS and staff informed at d/c.

## 2021-07-22 NOTE — Progress Notes (Signed)
Pt's states no medical or surgical changes since previsit or office visit. VS assessed by C.W 

## 2021-07-22 NOTE — Patient Instructions (Signed)
Handout given on hiatal hernia given to patient.  Await pathology results.  Office follow-up with Dr. Orvan Falconer or Willette Cluster - next available.    YOU HAD AN ENDOSCOPIC PROCEDURE TODAY AT THE [facility]:   Refer to the procedure report that was given to you for any specific questions about what was found during the examination.  If the procedure report does not answer your questions, please call your gastroenterologist to clarify.  If you requested that your care partner not be given the details of your procedure findings, then the procedure report has been included in a sealed envelope for you to review at your convenience later.  YOU SHOULD EXPECT: Some feelings of bloating in the abdomen. Passage of more gas than usual.  Walking can help get rid of the air that was put into your GI tract during the procedure and reduce the bloating. If you had a lower endoscopy (such as a colonoscopy or flexible sigmoidoscopy) you may notice spotting of blood in your stool or on the toilet paper. If you underwent a bowel prep for your procedure, you may not have a normal bowel movement for a few days.  Please Note:  You might notice some irritation and congestion in your nose or some drainage.  This is from the oxygen used during your procedure.  There is no need for concern and it should clear up in a day or so.  SYMPTOMS TO REPORT IMMEDIATELY:  Following upper endoscopy (EGD)  Vomiting of blood or coffee ground material  New chest pain or pain under the shoulder blades  Painful or persistently difficult swallowing  New shortness of breath  Fever of 100F or higher  Black, tarry-looking stools  For urgent or emergent issues, a gastroenterologist can be reached at any hour by calling [phone]. Do not use MyChart messaging for urgent concerns.    DIET:  We do recommend a small meal at first, but then you may proceed to your regular diet.  Drink plenty of fluids but you should avoid  alcoholic beverages for 24 hours.  ACTIVITY:  You should plan to take it easy for the rest of today and you should NOT DRIVE or use heavy machinery until tomorrow (because of the sedation medicines used during the test).    FOLLOW UP: Our staff will call the number listed on your records 48-72 hours following your procedure to check on you and address any questions or concerns that you may have regarding the information given to you following your procedure. If we do not reach you, we will leave a message.  We will attempt to reach you two times.  During this call, we will ask if you have developed any symptoms of COVID 19. If you develop any symptoms (ie: fever, flu-like symptoms, shortness of breath, cough etc.) before then, please call [phone].  If you test positive for Covid 19 in the 2 weeks post procedure, please call and report this information to [place].    If any biopsies were taken you will be contacted by phone or by letter within the next 1-3 weeks.  Please call us at [phone] if you have not heard about the biopsies in 3 weeks.    SIGNATURES/CONFIDENTIALITY: You and/or your care partner have signed paperwork which will be entered into your electronic medical record.  These signatures attest to the fact that that the information above on your After Visit Summary has been reviewed and is understood.  Full responsibility of  the confidentiality of this discharge information lies with you and/or your care-partner.

## 2021-07-22 NOTE — Progress Notes (Signed)
Referring Provider: [name], MD Primary Care Physician:  [name], MD  Reason for Procedure:  nausea, belching, postprandial LUQ pain   IMPRESSION:  Nausea, belching, postprandial LUQ pain History of gastroparesis  She is an appropriate candidate for anesthesia in the [facility]  PLAN: EGD in the [facility] today   HPI: Dana Harvey is a 60 y.o. female presents for upper endoscopy to evaluate nausea, belching, postprandial LUQ pain. See Gunnar Fusi Guenther's note from 01/08/21 for full details.     Past Medical History:  Diagnosis Date   Allergy    Anxiety    Chest pain at rest    normal cors on cath in 2012. Normal coronary CT in 2018. Normal stress test in 05/2019   CVA (cerebral vascular accident) (HCC) 1997   Depression    Diverticulitis    Dyspnea    previous PFT showed normal lung volumes, severe diffusion defect with DLCO 43% predicted   Fainting spell    Frequent headaches    Gastroparesis    GERD (gastroesophageal reflux disease)    Heart murmur    Insomnia    Insomnia    Migraines    Pacemaker    St Jude dual chamber PPM placed on 09/06/2014 by Dr. Gaynelle Adu in [employer] [place]   PAF (paroxysmal atrial fibrillation) [facility])    diagnosed by Dr. Gaynelle Adu, remote history, no recurrent   Polyp of intestine 2018   benign per patient   Seizures (HCC)    secondary to potassium level dropping (due to opioid medications per patient)   Thyroid disease     Past Surgical History:  Procedure Laterality Date   ABDOMINAL HYSTERECTOMY  1993   heavy vaginal bleeding; later went in and removed Right ovary   APPENDECTOMY  1984   ATRIAL ABLATION SURGERY  2012   BREAST EXCISIONAL BIOPSY     bilateral; benign   CARDIAC CATHETERIZATION  2012   normal cors on cath in [facility] tx   CESAREAN SECTION     [phone]   CHOLECYSTECTOMY  2010   ELECTROPHYSIOLOGY STUDY  around 2013   with ablation for clinically suspected inappropriate sinus tachycardia, by  Dr. Annice Pih   LOOP RECORDER IMPLANT  2013   by Dr. Annice Pih   PACEMAKER INSERTION  09/06/2014   St Jude Assurity dual chamber PPM, Dr Onalee Hua [place]--per patient; put in after ablation    Current Outpatient Medications  Medication Sig Dispense Refill   ALPRAZolam (XANAX) 0.5 MG tablet Take 1 tablet (0.5 mg total) by mouth 2 (two) times daily as needed for anxiety. 60 tablet 2   atorvastatin (LIPITOR) 80 MG tablet TAKE 1 TABLET BY MOUTH EVERY DAY 90 tablet 1   cetirizine (ZYRTEC) 10 MG tablet Take 10 mg by mouth daily.     pantoprazole (PROTONIX) 40 MG tablet Take 2 tablets (80 mg total) by mouth daily. 180 tablet 1   scopolamine (TRANSDERM-SCOP) 1 MG/3DAYS Place 1 patch (1.5 mg total) onto the skin every 3 (three) days. 10 patch 2   traZODone (DESYREL) 100 MG tablet Take 100 mg by mouth at bedtime.     zolpidem (AMBIEN) 10 MG tablet Take 1 tablet (10 mg total) by mouth at bedtime as needed for sleep. 30 tablet 5   ascorbic acid (VITAMIN C) 500 MG tablet      cholecalciferol (VITAMIN D3) 25 MCG (1000 UNIT) tablet      Vitamin E 180 MG (400 UNIT) CAPS  Current Facility-Administered Medications  Medication Dose Route Frequency Provider Last Rate Last Admin   0.9 %  sodium chloride infusion  500 mL Intravenous Once Tressia Danas, MD        Allergies as of 07/22/2021 - Review Complete 07/22/2021  Allergen Reaction Noted   Metoprolol Anxiety, Other (See Comments), Palpitations, and Shortness Of Breath 05/11/2017   Nystatin Anaphylaxis 04/01/2019   Penicillins Anaphylaxis 01/13/2019   Aspirin Tinitus 01/13/2019   Crestor [rosuvastatin calcium]  05/20/2019   Fleet enema [enema]  01/02/2020   Gluten meal Diarrhea and Nausea And Vomiting 04/26/2017   Midodrine hcl  06/21/2021   Miralax [polyethylene glycol]  01/02/2020   Motegrity [prucalopride] Other (See Comments) 01/08/2021    Family History  Problem Relation Age of Onset   Osteoarthritis Mother    Alcohol abuse Mother     Arthritis Mother    COPD Mother    Depression Mother    Hearing loss Mother    Hyperlipidemia Mother    Hypertension Mother    Cancer Father    Diabetes Father    Heart disease Father 52       CABG   Hypertension Father    Alcohol abuse Brother    Cancer Brother    Depression Brother    Drug abuse Brother    Alcohol abuse Son    Drug abuse Son    Early death Maternal Grandmother 80   Heart disease Maternal Grandmother    Alcohol abuse Maternal Grandfather    Heart disease Maternal Grandfather 82   Cancer Paternal Grandmother    Heart disease Paternal Grandmother    Cancer Paternal Grandfather    Heart disease Paternal Grandfather    Alcohol abuse Brother    Depression Brother    Diabetes Brother    Drug abuse Brother    Hypertension Brother    Hyperlipidemia Brother      Physical Exam: General:   Alert,  well-nourished, pleasant and cooperative in NAD Head:  Normocephalic and atraumatic. Eyes:  Sclera clear, no icterus.   Conjunctiva pink. Mouth:  No deformity or lesions.   Neck:  Supple; no masses or thyromegaly. Lungs:  Clear throughout to auscultation.   No wheezes. Heart:  Regular rate and rhythm; no murmurs. Abdomen:  Soft, non-tender, nondistended, normal bowel sounds, no rebound or guarding.  Msk:  Symmetrical. No boney deformities LAD: No inguinal or umbilical LAD Extremities:  No clubbing or edema. Neurologic:  Alert and  oriented x4;  grossly nonfocal Skin:  No obvious rash or bruise. Psych:  Alert and cooperative. Normal mood and affect.     Studies/Results: No results found.    Izac Faulkenberry L. Orvan Falconer, MD, MPH 07/22/2021, 8:10 AM

## 2021-07-24 ENCOUNTER — Telehealth: Payer: Self-pay | Admitting: *Deleted

## 2021-07-24 NOTE — Telephone Encounter (Signed)
  Follow up Call-  Call back number 07/22/2021  Post procedure Call Back phone  # [phone]  Permission to leave phone message Yes  Some recent data might be hidden     Patient questions:  Do you have a fever, pain , or abdominal swelling? No. Pain Score  0 *  Have you tolerated food without any problems? Yes.    Have you been able to return to your normal activities? Yes.    Do you have any questions about your discharge instructions: Diet   No. Medications  No. Follow up visit  No.  Do you have questions or concerns about your Care? No.  Actions: * If pain score is 4 or above: No action needed, pain <4.  Have you developed a fever since your procedure? no  2.   Have you had an respiratory symptoms (SOB or cough) since your procedure? no  3.   Have you tested positive for COVID 19 since your procedure no  4.   Have you had any family members/close contacts diagnosed with the COVID 19 since your procedure?  no   If yes to any of these questions please route to Laverna Peace, RN and Karlton Lemon, RN

## 2021-07-27 ENCOUNTER — Telehealth: Payer: BC Managed Care – PPO | Admitting: Nurse Practitioner

## 2021-07-27 DIAGNOSIS — J0100 Acute maxillary sinusitis, unspecified: Secondary | ICD-10-CM

## 2021-07-27 DIAGNOSIS — R051 Acute cough: Secondary | ICD-10-CM | POA: Diagnosis not present

## 2021-07-27 MED ORDER — DOXYCYCLINE HYCLATE 100 MG PO TABS: 100.0000 mg | ORAL_TABLET | Freq: Two times a day (BID) | ORAL | 0 refills | Status: DC

## 2021-07-27 MED ORDER — PROMETHAZINE-DM 6.25-15 MG/5ML PO SYRP: 5.0000 mL | ORAL_SOLUTION | Freq: Four times a day (QID) | ORAL | 0 refills | Status: DC | PRN

## 2021-07-27 NOTE — Progress Notes (Signed)
Virtual Visit Consent   Dana Harvey, you are scheduled for a virtual visit with Dana Daphine Deutscher, FNP, a [facility] provider, today.     Just as with appointments in the office, your consent must be obtained to participate.  Your consent will be active for this visit and any virtual visit you may have with one of our providers in the next 365 days.     If you have a MyChart account, a copy of this consent can be sent to you electronically.  All virtual visits are billed to your insurance company just like a traditional visit in the office.    As this is a virtual visit, video technology does not allow for your provider to perform a traditional examination.  This may limit your provider's ability to fully assess your condition.  If your provider identifies any concerns that need to be evaluated in person or the need to arrange testing (such as labs, EKG, etc.), we will make arrangements to do so.     Although advances in technology are sophisticated, we cannot ensure that it will always work on either your end or our end.  If the connection with a video visit is poor, the visit may have to be switched to a telephone visit.  With either a video or telephone visit, we are not always able to ensure that we have a secure connection.     I need to obtain your verbal consent now.   Are you willing to proceed with your visit today? YES   Inioluwa Boulay Batte has provided verbal consent on 07/27/2021 for a virtual visit (video or telephone).   Dana Daphine Deutscher, FNP   Date: 07/27/2021 9:11 AM   Virtual Visit via Video Note   I, Dana Harvey, connected with Dana Harvey ([id number], January 23, 1961) on 07/27/21 at  9:30 AM EDT by a video-enabled telemedicine application and verified that I am speaking with the correct person using two identifiers.  Location: Patient: Virtual Visit Location Patient: Home Provider: Virtual Visit Location Provider: Mobile   I  discussed the limitations of evaluation and management by telemedicine and the availability of in person appointments. The patient expressed understanding and agreed to proceed.    History of Present Illness: Dana Harvey is a 60 y.o. who identifies as a female who was assigned female at birth, and is being seen today for cough and congestion.  HPI: Patient states she had an endoscopy on Monday. On Tuesday the OR nurse called and asked her if she had cold symptoms. At that time she was fine. By Wednesday she developed cough and congestion. Cough has become productive and is changing colors. She works as a Paediatric nurse and she tested negative for covid yesterday.she has been taking zyrtec, vitamin C , robitussin and alkseltser plus. She has deveoped sinus pressur e today.     Review of Systems  Constitutional:  Positive for malaise/fatigue. Negative for chills and fever.  HENT:  Positive for congestion and sinus pain.   Respiratory:  Positive for cough and sputum production. Negative for shortness of breath.   Musculoskeletal:  Negative for myalgias.  Neurological:  Negative for headaches.   Problems:  Patient Active Problem List   Diagnosis Date Noted   Sinus node dysfunction (HCC) 11/25/2020   Orthostatic intolerance 11/25/2020   Insomnia 03/22/2020   Hyperlipidemia 03/22/2020   Pacemaker 04/01/2019   Osteopenia 04/01/2019   Anxiety 04/01/2019    Allergies:  Allergies  Allergen Reactions  Metoprolol Anxiety, Other (See Comments), Palpitations and Shortness Of Breath    Other reaction(s): Hallucinations   Nystatin Anaphylaxis   Penicillins Anaphylaxis   Aspirin Tinitus   Crestor [Rosuvastatin Calcium]    Fleet Enema [Enema]     vomiting   Gluten Meal Diarrhea and Nausea And Vomiting   Midodrine Hcl     Tachycardia per patient   Miralax [Polyethylene Glycol]     Vomiting, patient states this causes a-fib and leads to syncope   Motegrity [Prucalopride] Other  (See Comments)    Made her have bad thoughts of harming herself   Medications:  Current Outpatient Medications:    ALPRAZolam (XANAX) 0.5 MG tablet, Take 1 tablet (0.5 mg total) by mouth 2 (two) times daily as needed for anxiety., Disp: 60 tablet, Rfl: 2   ascorbic acid (VITAMIN C) 500 MG tablet, , Disp: , Rfl:    atorvastatin (LIPITOR) 80 MG tablet, TAKE 1 TABLET BY MOUTH EVERY DAY, Disp: 90 tablet, Rfl: 1   cetirizine (ZYRTEC) 10 MG tablet, Take 10 mg by mouth daily., Disp: , Rfl:    cholecalciferol (VITAMIN D3) 25 MCG (1000 UNIT) tablet, , Disp: , Rfl:    pantoprazole (PROTONIX) 40 MG tablet, Take 2 tablets (80 mg total) by mouth daily., Disp: 180 tablet, Rfl: 1   scopolamine (TRANSDERM-SCOP) 1 MG/3DAYS, Place 1 patch (1.5 mg total) onto the skin every 3 (three) days., Disp: 10 patch, Rfl: 2   traZODone (DESYREL) 100 MG tablet, Take 100 mg by mouth at bedtime., Disp: , Rfl:    Vitamin E 180 MG (400 UNIT) CAPS, , Disp: , Rfl:    zolpidem (AMBIEN) 10 MG tablet, Take 1 tablet (10 mg total) by mouth at bedtime as needed for sleep., Disp: 30 tablet, Rfl: 5  Observations/Objective: Patient is well-developed, well-nourished in no acute distress.  Resting comfortably  at home.  Head is normocephalic, atraumatic.  No labored breathing.  Speech is clear and coherent with logical content.  Patient is alert and oriented at baseline.  Voice nasal Wet cough  Assessment and Plan:  Dana Harvey in today with chief complaint of No chief complaint on file.   1. Acute non-recurrent maxillary sinusitis  2. Acute cough 1. Take meds as prescribed 2. Use a cool mist humidifier especially during the winter months and when heat has been humid. 3. Use saline nose sprays frequently 4. Saline irrigations of the nose can be very helpful if done frequently.  * 4X daily for 1 week*  * Use of a nettie pot can be helpful with this. Follow directions with this* 5. Drink plenty of fluids 6. Keep  thermostat turn down low 7.For any cough or congestion- meds as prescribed 8. For fever or aces or pains- take tylenol or ibuprofen appropriate for age and weight.  * for fevers greater than 101 orally you may alternate ibuprofen and tylenol every  3 hours.   Meds ordered this encounter  Medications   doxycycline (VIBRA-TABS) 100 MG tablet    Sig: Take 1 tablet (100 mg total) by mouth 2 (two) times daily. 1 po bid    Dispense:  20 tablet    Refill:  0    Order Specific Question:   Supervising Provider    Answer:   Eber Hong [[id number]]   promethazine-dextromethorphan (PROMETHAZINE-DM) 6.25-15 MG/5ML syrup    Sig: Take 5 mLs by mouth 4 (four) times daily as needed for cough.    Dispense:  118 mL  Refill:  0    Order Specific Question:   Supervising Provider    Answer:   Eber Hong [[id number]]       Follow Up Instructions: I discussed the assessment and treatment plan with the patient. The patient was provided an opportunity to ask questions and all were answered. The patient agreed with the plan and demonstrated an understanding of the instructions.  A copy of instructions were sent to the patient via MyChart.  The patient was advised to call back or seek an in-person evaluation if the symptoms worsen or if the condition fails to improve as anticipated.  Time:  I spent 12 minutes with the patient via telehealth technology discussing the above problems/concerns.    Dana Daphine Deutscher, FNP

## 2021-08-01 ENCOUNTER — Other Ambulatory Visit: Payer: Self-pay | Admitting: Family Medicine

## 2021-08-04 ENCOUNTER — Encounter: Payer: Self-pay | Admitting: Gastroenterology

## 2021-08-16 ENCOUNTER — Telehealth (INDEPENDENT_AMBULATORY_CARE_PROVIDER_SITE_OTHER): Payer: BC Managed Care – PPO | Admitting: Gastroenterology

## 2021-08-16 ENCOUNTER — Encounter: Payer: Self-pay | Admitting: Gastroenterology

## 2021-08-16 DIAGNOSIS — K3184 Gastroparesis: Secondary | ICD-10-CM

## 2021-08-16 DIAGNOSIS — R1012 Left upper quadrant pain: Secondary | ICD-10-CM

## 2021-08-16 DIAGNOSIS — R110 Nausea: Secondary | ICD-10-CM

## 2021-08-16 NOTE — Progress Notes (Signed)
Virtual appointment scheduled for 9:50. I was unable to log in until 10:00 due to clinic delays. The patient was not available at that time. Dana Harvey followed up with the patient by phone and Dana Harvey reported waiting for at least 30 minutes and could not wait longer. She was feeling better and will reschedule if needed.

## 2021-09-15 ENCOUNTER — Encounter: Payer: Self-pay | Admitting: Internal Medicine

## 2021-09-16 ENCOUNTER — Ambulatory Visit (INDEPENDENT_AMBULATORY_CARE_PROVIDER_SITE_OTHER): Payer: BC Managed Care – PPO

## 2021-09-16 DIAGNOSIS — I495 Sick sinus syndrome: Secondary | ICD-10-CM | POA: Diagnosis not present

## 2021-09-17 LAB — CUP PACEART REMOTE DEVICE CHECK
Battery Remaining Longevity: 41 mo
Battery Remaining Percentage: 37 %
Battery Voltage: 2.96 V
Brady Statistic AP VP Percent: 1 %
Brady Statistic AP VS Percent: 81 %
Brady Statistic AS VP Percent: 1 %
Brady Statistic AS VS Percent: 19 %
Brady Statistic RA Percent Paced: 77 %
Brady Statistic RV Percent Paced: 1 %
Date Time Interrogation Session: 20221121184740
Implantable Lead Implant Date: 20151111
Implantable Lead Implant Date: 20151111
Implantable Lead Location: 753859
Implantable Lead Location: 753860
Implantable Pulse Generator Implant Date: 20151111
Lead Channel Impedance Value: 400 Ohm
Lead Channel Impedance Value: 450 Ohm
Lead Channel Pacing Threshold Amplitude: 0.5 V
Lead Channel Pacing Threshold Amplitude: 0.5 V
Lead Channel Pacing Threshold Pulse Width: 0.4 ms
Lead Channel Pacing Threshold Pulse Width: 0.4 ms
Lead Channel Sensing Intrinsic Amplitude: 10 mV
Lead Channel Sensing Intrinsic Amplitude: 4.3 mV
Lead Channel Setting Pacing Amplitude: 2 V
Lead Channel Setting Pacing Amplitude: 2.5 V
Lead Channel Setting Pacing Pulse Width: 0.4 ms
Lead Channel Setting Sensing Sensitivity: 0.5 mV
Pulse Gen Model: 2240
Pulse Gen Serial Number: 7694052

## 2021-09-24 NOTE — Progress Notes (Signed)
Remote pacemaker transmission.   

## 2021-11-06 DIAGNOSIS — H5311 Day blindness: Secondary | ICD-10-CM | POA: Diagnosis not present

## 2021-12-09 ENCOUNTER — Other Ambulatory Visit: Payer: Self-pay | Admitting: Family Medicine

## 2021-12-16 ENCOUNTER — Ambulatory Visit (INDEPENDENT_AMBULATORY_CARE_PROVIDER_SITE_OTHER): Payer: BC Managed Care – PPO

## 2021-12-16 ENCOUNTER — Encounter: Payer: Self-pay | Admitting: Internal Medicine

## 2021-12-16 DIAGNOSIS — I495 Sick sinus syndrome: Secondary | ICD-10-CM | POA: Diagnosis not present

## 2021-12-16 LAB — CUP PACEART REMOTE DEVICE CHECK
Battery Remaining Longevity: 38 mo
Battery Remaining Percentage: 35 %
Battery Voltage: 2.96 V
Brady Statistic AP VP Percent: 1 %
Brady Statistic AP VS Percent: 81 %
Brady Statistic AS VP Percent: 1 %
Brady Statistic AS VS Percent: 19 %
Brady Statistic RA Percent Paced: 78 %
Brady Statistic RV Percent Paced: 1 %
Date Time Interrogation Session: 20230220035455
Implantable Lead Implant Date: 20151111
Implantable Lead Implant Date: 20151111
Implantable Lead Location: 753859
Implantable Lead Location: 753860
Implantable Pulse Generator Implant Date: 20151111
Lead Channel Impedance Value: 450 Ohm
Lead Channel Impedance Value: 450 Ohm
Lead Channel Pacing Threshold Amplitude: 0.5 V
Lead Channel Pacing Threshold Amplitude: 0.5 V
Lead Channel Pacing Threshold Pulse Width: 0.4 ms
Lead Channel Pacing Threshold Pulse Width: 0.4 ms
Lead Channel Sensing Intrinsic Amplitude: 4.4 mV
Lead Channel Sensing Intrinsic Amplitude: 9.2 mV
Lead Channel Setting Pacing Amplitude: 2 V
Lead Channel Setting Pacing Amplitude: 2.5 V
Lead Channel Setting Pacing Pulse Width: 0.4 ms
Lead Channel Setting Sensing Sensitivity: 0.5 mV
Pulse Gen Model: 2240
Pulse Gen Serial Number: 7694052

## 2021-12-18 ENCOUNTER — Telehealth (INDEPENDENT_AMBULATORY_CARE_PROVIDER_SITE_OTHER): Payer: BC Managed Care – PPO | Admitting: Family Medicine

## 2021-12-18 ENCOUNTER — Encounter: Payer: Self-pay | Admitting: Family Medicine

## 2021-12-18 VITALS — BP 108/72 | HR 68 | Temp 98.6°F | Wt 122.0 lb

## 2021-12-18 DIAGNOSIS — G4700 Insomnia, unspecified: Secondary | ICD-10-CM | POA: Diagnosis not present

## 2021-12-18 DIAGNOSIS — F419 Anxiety disorder, unspecified: Secondary | ICD-10-CM | POA: Diagnosis not present

## 2021-12-18 NOTE — Progress Notes (Signed)
Virtual Visit via Video Note  I connected with Dana Harvey  on 12/18/21 at  1:00 PM EST by a video enabled telemedicine application and verified that I am speaking with the correct person using two identifiers.  Location patient: home Location provider: [place], [place] [address] Persons participating in the virtual visit: patient, provider  I discussed the limitations of evaluation and management by telemedicine and the availability of in person appointments. The patient expressed understanding and agreed to proceed.  Multiple log ins attempted.although patient logged in, provider could not see or hear her, so we switched to phone visit.   Dana Harvey DOB: 1960-12-16 Encounter date: 12/18/2021  This is a 61 y.o. female who presents with Chief Complaint  Patient presents with   Medication Refill    History of present illness: She is doing well, better. Her mom has been diagnosed with MDS with pancytopenia and so she has recently been with her and helping care for her. Feels better after being able to spend some time with her.   Had to start taking xanax to work with her. Takes half around 10 and second half around 4. Son still drifting deeper into drugs, but she feels she is handling this ok. Significant other is support. Boss supportive. This seems to work well for her. Previously on zoloft (50mg  dose). Didn't feel like it helped and then anxiety was doing better and stopped medication. Wanted to limit meds.  Taking zolpidem for sleep along with trazodone. Sleeps longer. Individually these don't work for her.   Following with cardiology regularly; ekg q 3 months.   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   Allergies  Allergen Reactions   Metoprolol Anxiety, Other (See Comments), Palpitations and Shortness Of  Breath    Other reaction(s): Hallucinations   Nystatin Anaphylaxis   Penicillins Anaphylaxis   Aspirin Tinitus   Crestor [Rosuvastatin Calcium]    Fleet Enema [Enema]     vomiting   Gluten Meal Diarrhea and Nausea And Vomiting   Midodrine Hcl     Tachycardia per patient   Miralax [Polyethylene Glycol]     Vomiting, patient states this causes a-fib and leads to syncope   Motegrity [Prucalopride] Other (See Comments)    Made her have bad thoughts of harming herself   Current Meds  Medication Sig   ALPRAZolam (XANAX) 0.5 MG tablet TAKE 1 TABLET BY MOUTH TWICE A DAY AS NEEDED FOR ANXIETY   ascorbic acid (VITAMIN C) 500 MG tablet    atorvastatin (LIPITOR) 80 MG tablet TAKE 1 TABLET BY MOUTH EVERY DAY   cetirizine (ZYRTEC) 10 MG tablet Take 10 mg by mouth daily.   cholecalciferol (VITAMIN D3) 25 MCG (1000 UNIT) tablet    doxycycline (VIBRA-TABS) 100 MG tablet Take 1 tablet (100 mg total) by mouth 2 (two) times daily. 1 po bid   pantoprazole (PROTONIX) 40 MG tablet Take 2 tablets (80 mg total) by mouth daily.   scopolamine (TRANSDERM-SCOP) 1 MG/3DAYS Place 1 patch (1.5 mg total) onto the skin every 3 (three) days.   traZODone (DESYREL) 100 MG tablet Take 100 mg by mouth at bedtime.   Vitamin E 180 MG (400 UNIT) CAPS    zolpidem (AMBIEN) 10 MG tablet TAKE 1 TABLET BY MOUTH AT BEDTIME AS NEEDED FOR SLEEP.    Review of Systems  Constitutional:  Negative for chills, fatigue and fever.  Respiratory:  Negative for cough, chest tightness, shortness of breath and wheezing.   Cardiovascular:  Negative for chest pain, palpitations and leg swelling.  Psychiatric/Behavioral:  Positive for sleep disturbance (ok with current meds). The patient is nervous/anxious.    Objective:  BP 108/72    Pulse 68    Temp 98.6 F (37 C)    Wt 122 lb (55.3 kg)    BMI 19.69 kg/m   Weight: 122 lb (55.3 kg)   BP Readings from Last 3 Encounters:  12/18/21 108/72  07/22/21 128/75  06/21/21 100/62   Wt Readings  from Last 3 Encounters:  12/18/21 122 lb (55.3 kg)  07/22/21 122 lb (55.3 kg)  06/21/21 124 lb 14.4 oz (56.7 kg)    EXAM:  GENERAL: alert, oriented, appears well and in no acute distress  HEENT: atraumatic, conjunctiva clear, no obvious abnormalities on inspection of external nose and ears  NECK: normal movements of the head and neck  LUNGS: on inspection no signs of respiratory distress, breathing rate appears normal, no obvious gross SOB, gasping or wheezing  CV: no obvious cyanosis  MS: moves all visible extremities without noticeable abnormality  PSYCH/NEURO: pleasant and cooperative, no obvious depression or anxiety, speech and thought processing grossly intact   Assessment/Plan  1. Anxiety Going ok now; taking xanax at least daily currently. If anxiety not improving, would consider re-discussion of baseline controlling med(SSRI); could even consider gene sight to help with med selection.   2. Insomnia, unspecified type Ongoing issue, currently stable with meds.      I discussed the assessment and treatment plan with the patient. The patient was provided an opportunity to ask questions and all were answered. The patient agreed with the plan and demonstrated an understanding of the instructions.   The patient was advised to call back or seek an in-person evaluation if the symptoms worsen or if the condition fails to improve as anticipated.  I provided 15 minutes of face-to-face time during this encounter.   Micheline Rough, MD

## 2021-12-20 NOTE — Progress Notes (Signed)
Remote pacemaker transmission.   

## 2021-12-31 ENCOUNTER — Encounter: Payer: Self-pay | Admitting: Family Medicine

## 2022-01-06 MED ORDER — BUSPIRONE HCL 15 MG PO TABS: ORAL_TABLET | ORAL | 2 refills | Status: DC

## 2022-01-24 ENCOUNTER — Other Ambulatory Visit: Payer: Self-pay | Admitting: Family Medicine

## 2022-02-01 ENCOUNTER — Other Ambulatory Visit: Payer: Self-pay | Admitting: Family Medicine

## 2022-02-28 ENCOUNTER — Telehealth: Payer: Self-pay | Admitting: Family Medicine

## 2022-02-28 NOTE — Telephone Encounter (Signed)
Spoke with the patient and informed her an appt is needed.  Offered an appt for next week with Dr [place], the patient declined as she is out of town and agreed to call back for an appt. ?

## 2022-02-28 NOTE — Telephone Encounter (Signed)
Pt is calling and was offered an appt and just wants to have blood work done to check her potassium. Pt is having leg cramps. Please advise ?

## 2022-03-13 ENCOUNTER — Telehealth: Payer: Self-pay | Admitting: Gastroenterology

## 2022-03-13 NOTE — Telephone Encounter (Signed)
Patient called.  Please call back.  Thank you.

## 2022-03-13 NOTE — Telephone Encounter (Signed)
Called Dana Harvey and informed her of Dr. Orvan Falconer recommendations re: proceeding to ED to ensure this is not a concern for fecal impaction placing her at an increased risk for bowel perf. Dana Harvey verbalized acceptance and understanding.

## 2022-03-13 NOTE — Telephone Encounter (Signed)
Returned pt call to inquire further about her symptoms. 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 States her last BM was Sunday (03/09/22). Prior to that her stools were very hard and needed to intensely strain to defecate. Is having significant abd pain and feeling very nauseous. States abd pain is impacting her ability to move. Denies fever. Did not feel pt would benefit from a bowel purge and felt pt needed to proceed to ED for further evaluation for possible fecal impaction and to reduce risk for bowel perforation. Pt insisted she did not feel this was serious enough to proceed to the ED. Routing this message to Dr. Orvan Falconer for her to review and advise further.

## 2022-03-13 NOTE — Telephone Encounter (Signed)
Attempted to return pt call. Phone rang x2, then was disconnected. Tried again for a second time and same issue occurred.

## 2022-03-13 NOTE — Telephone Encounter (Signed)
Inbound call from patient stating she is having a hard time going to the bathroom and is havibg abd pain. Patient stated she is very " sick" and is seeking advice on what she can do to help. Please advise.

## 2022-03-17 ENCOUNTER — Ambulatory Visit (INDEPENDENT_AMBULATORY_CARE_PROVIDER_SITE_OTHER): Payer: BC Managed Care – PPO

## 2022-03-17 DIAGNOSIS — I495 Sick sinus syndrome: Secondary | ICD-10-CM | POA: Diagnosis not present

## 2022-03-18 ENCOUNTER — Encounter: Payer: Self-pay | Admitting: Internal Medicine

## 2022-03-18 LAB — CUP PACEART REMOTE DEVICE CHECK
Battery Remaining Longevity: 36 mo
Battery Remaining Percentage: 32 %
Battery Voltage: 2.96 V
Brady Statistic AP VP Percent: 1 %
Brady Statistic AP VS Percent: 81 %
Brady Statistic AS VP Percent: 1 %
Brady Statistic AS VS Percent: 18 %
Brady Statistic RA Percent Paced: 78 %
Brady Statistic RV Percent Paced: 1 %
Date Time Interrogation Session: 20230522020013
Implantable Lead Implant Date: 20151111
Implantable Lead Implant Date: 20151111
Implantable Lead Location: 753859
Implantable Lead Location: 753860
Implantable Pulse Generator Implant Date: 20151111
Lead Channel Impedance Value: 400 Ohm
Lead Channel Impedance Value: 450 Ohm
Lead Channel Pacing Threshold Amplitude: 0.5 V
Lead Channel Pacing Threshold Amplitude: 0.5 V
Lead Channel Pacing Threshold Pulse Width: 0.4 ms
Lead Channel Pacing Threshold Pulse Width: 0.4 ms
Lead Channel Sensing Intrinsic Amplitude: 12 mV
Lead Channel Sensing Intrinsic Amplitude: 5 mV
Lead Channel Setting Pacing Amplitude: 2 V
Lead Channel Setting Pacing Amplitude: 2.5 V
Lead Channel Setting Pacing Pulse Width: 0.4 ms
Lead Channel Setting Sensing Sensitivity: 0.5 mV
Pulse Gen Model: 2240
Pulse Gen Serial Number: 7694052

## 2022-04-03 NOTE — Progress Notes (Signed)
Remote pacemaker transmission.   

## 2022-04-07 ENCOUNTER — Telehealth: Payer: Self-pay | Admitting: Family Medicine

## 2022-04-07 ENCOUNTER — Other Ambulatory Visit: Payer: Self-pay | Admitting: Family

## 2022-04-07 MED ORDER — ALPRAZOLAM 0.5 MG PO TABS: ORAL_TABLET | ORAL | 2 refills | Status: AC

## 2022-04-07 NOTE — Telephone Encounter (Signed)
Pt need a refill on ALPRAZolam (XANAX) 0.5 MG tablet . Pt has a virtual appt with padonda on 04-11-2022  [id number] [place], [place] - [address] Phone:  [phone]  Fax:  [phone]

## 2022-04-11 ENCOUNTER — Encounter: Payer: Self-pay | Admitting: Family

## 2022-04-11 ENCOUNTER — Telehealth (INDEPENDENT_AMBULATORY_CARE_PROVIDER_SITE_OTHER): Payer: BC Managed Care – PPO | Admitting: Family

## 2022-04-11 VITALS — BP 108/68 | HR 74 | Temp 98.0°F | Wt 123.0 lb

## 2022-04-11 DIAGNOSIS — F430 Acute stress reaction: Secondary | ICD-10-CM

## 2022-04-11 DIAGNOSIS — F321 Major depressive disorder, single episode, moderate: Secondary | ICD-10-CM

## 2022-04-11 DIAGNOSIS — G4700 Insomnia, unspecified: Secondary | ICD-10-CM | POA: Diagnosis not present

## 2022-04-11 DIAGNOSIS — F419 Anxiety disorder, unspecified: Secondary | ICD-10-CM | POA: Diagnosis not present

## 2022-04-11 MED ORDER — BUPROPION HCL ER (XL) 150 MG PO TB24: 150.0000 mg | ORAL_TABLET | Freq: Every day | ORAL | 3 refills | Status: DC

## 2022-04-11 MED ORDER — TRAZODONE HCL 100 MG PO TABS: 100.0000 mg | ORAL_TABLET | Freq: Every day | ORAL | 3 refills | Status: DC

## 2022-04-15 NOTE — Progress Notes (Signed)
Virtual Visit via Telephone Note  I connected with Dana Harvey on 04/15/22 at  3:45 PM EDT by telephone and verified that I am speaking with the correct person using two identifiers.  Location: Patient: Home Provider: Lacey Harvey    I discussed the limitations, risks, security and privacy concerns of performing an evaluation and management service by telephone and the availability of in person appointments. I also discussed with the patient that there may be a patient responsible charge related to this service. The patient expressed understanding and agreed to proceed.   History of Present Illness: 61 year old female presents by phone with concerns of anxiety and stress after her husband abruptly and unexpectedly left their marriage. She feels lost, helpless, and confused. She has been tearful and unsure of how to move forward. She also has been having trouble sleeping.     Observations/Objective: A&O, NAD   Assessment and Plan: Dana Harvey was seen today for medication refill.  Diagnoses and all orders for this visit:  Anxiety  Insomnia, unspecified type  Depression, major, single episode, moderate (HCC)  Acute stress reaction -     Ambulatory referral to Behavioral Health  Other orders -     buPROPion (WELLBUTRIN XL) 150 MG 24 hr tablet; Take 1 tablet (150 mg total) by mouth daily. -     traZODone (DESYREL) 100 MG tablet; Take 1 tablet (100 mg total) by mouth at bedtime.      Follow Up Instructions: Follow-up in 1 week.     I discussed the assessment and treatment plan with the patient. The patient was provided an opportunity to ask questions and all were answered. The patient agreed with the plan and demonstrated an understanding of the instructions.   The patient was advised to call back or seek an in-person evaluation if the symptoms worsen or if the condition fails to improve as anticipated.  I provided 25 minutes of non-face-to-face time during this  encounter.   Dana Foster, FNP

## 2022-04-25 ENCOUNTER — Emergency Department (HOSPITAL_COMMUNITY): Payer: BC Managed Care – PPO

## 2022-04-25 ENCOUNTER — Emergency Department (HOSPITAL_COMMUNITY)
Admission: EM | Admit: 2022-04-25 | Discharge: 2022-04-25 | Discharge status: Against Medical Advice | Payer: BC Managed Care – PPO | Attending: Emergency Medicine | Admitting: Emergency Medicine

## 2022-04-25 ENCOUNTER — Encounter (HOSPITAL_COMMUNITY): Payer: Self-pay

## 2022-04-25 ENCOUNTER — Other Ambulatory Visit: Payer: Self-pay

## 2022-04-25 DIAGNOSIS — R410 Disorientation, unspecified: Secondary | ICD-10-CM | POA: Diagnosis not present

## 2022-04-25 DIAGNOSIS — R079 Chest pain, unspecified: Secondary | ICD-10-CM | POA: Diagnosis not present

## 2022-04-25 DIAGNOSIS — I6502 Occlusion and stenosis of left vertebral artery: Secondary | ICD-10-CM | POA: Diagnosis not present

## 2022-04-25 DIAGNOSIS — R29818 Other symptoms and signs involving the nervous system: Secondary | ICD-10-CM | POA: Diagnosis not present

## 2022-04-25 DIAGNOSIS — R4182 Altered mental status, unspecified: Secondary | ICD-10-CM | POA: Insufficient documentation

## 2022-04-25 DIAGNOSIS — Z950 Presence of cardiac pacemaker: Secondary | ICD-10-CM | POA: Diagnosis not present

## 2022-04-25 DIAGNOSIS — I4891 Unspecified atrial fibrillation: Secondary | ICD-10-CM | POA: Diagnosis not present

## 2022-04-25 DIAGNOSIS — Z79899 Other long term (current) drug therapy: Secondary | ICD-10-CM | POA: Insufficient documentation

## 2022-04-25 DIAGNOSIS — Z8659 Personal history of other mental and behavioral disorders: Secondary | ICD-10-CM | POA: Diagnosis not present

## 2022-04-25 DIAGNOSIS — R531 Weakness: Secondary | ICD-10-CM

## 2022-04-25 DIAGNOSIS — R911 Solitary pulmonary nodule: Secondary | ICD-10-CM | POA: Diagnosis not present

## 2022-04-25 DIAGNOSIS — R0789 Other chest pain: Secondary | ICD-10-CM | POA: Insufficient documentation

## 2022-04-25 DIAGNOSIS — I700 Atherosclerosis of aorta: Secondary | ICD-10-CM | POA: Diagnosis not present

## 2022-04-25 DIAGNOSIS — R519 Headache, unspecified: Secondary | ICD-10-CM | POA: Insufficient documentation

## 2022-04-25 DIAGNOSIS — Q272 Other congenital malformations of renal artery: Secondary | ICD-10-CM | POA: Diagnosis not present

## 2022-04-25 DIAGNOSIS — K551 Chronic vascular disorders of intestine: Secondary | ICD-10-CM | POA: Diagnosis not present

## 2022-04-25 DIAGNOSIS — G459 Transient cerebral ischemic attack, unspecified: Secondary | ICD-10-CM | POA: Diagnosis not present

## 2022-04-25 DIAGNOSIS — I6521 Occlusion and stenosis of right carotid artery: Secondary | ICD-10-CM | POA: Diagnosis not present

## 2022-04-25 DIAGNOSIS — I774 Celiac artery compression syndrome: Secondary | ICD-10-CM | POA: Diagnosis not present

## 2022-04-25 LAB — DIFFERENTIAL
Abs Immature Granulocytes: 0.03 10*3/uL (ref 0.00–0.07)
Basophils Absolute: 0 10*3/uL (ref 0.0–0.1)
Basophils Relative: 1 %
Eosinophils Absolute: 0 10*3/uL (ref 0.0–0.5)
Eosinophils Relative: 0 %
Immature Granulocytes: 0 %
Lymphocytes Relative: 26 %
Lymphs Abs: 2.2 10*3/uL (ref 0.7–4.0)
Monocytes Absolute: 0.5 10*3/uL (ref 0.1–1.0)
Monocytes Relative: 6 %
Neutro Abs: 5.8 10*3/uL (ref 1.7–7.7)
Neutrophils Relative %: 67 %

## 2022-04-25 LAB — TROPONIN I (HIGH SENSITIVITY)
Troponin I (High Sensitivity): 3 ng/L (ref <18)
Troponin I (High Sensitivity): 3 ng/L (ref <18)

## 2022-04-25 LAB — COMPREHENSIVE METABOLIC PANEL
ALT: 20 U/L (ref 0–44)
AST: 20 U/L (ref 15–41)
Albumin: 4.1 g/dL (ref 3.5–5.0)
Alkaline Phosphatase: 70 U/L (ref 38–126)
Anion gap: 7 (ref 5–15)
BUN: 8 mg/dL (ref 6–20)
CO2: 24 mmol/L (ref 22–32)
Calcium: 9.5 mg/dL (ref 8.9–10.3)
Chloride: 108 mmol/L (ref 98–111)
Creatinine, Ser: 0.82 mg/dL (ref 0.44–1.00)
GFR, Estimated: >60 mL/min (ref >60)
Glucose, Bld: 93 mg/dL (ref 70–99)
Potassium: 3.8 mmol/L (ref 3.5–5.1)
Sodium: 139 mmol/L (ref 135–145)
Total Bilirubin: 0.8 mg/dL (ref 0.3–1.2)
Total Protein: 6.1 g/dL — ABNORMAL LOW (ref 6.5–8.1)

## 2022-04-25 LAB — URINALYSIS, ROUTINE W REFLEX MICROSCOPIC
Bilirubin Urine: NEGATIVE
Glucose, UA: NEGATIVE mg/dL
Hgb urine dipstick: NEGATIVE
Ketones, ur: NEGATIVE mg/dL
Leukocytes,Ua: NEGATIVE
Nitrite: NEGATIVE
Protein, ur: NEGATIVE mg/dL
Specific Gravity, Urine: 1.03 (ref 1.005–1.030)
pH: 8 (ref 5.0–8.0)

## 2022-04-25 LAB — I-STAT CHEM 8, ED
BUN: 7 mg/dL (ref 6–20)
Calcium, Ion: 1.14 mmol/L — ABNORMAL LOW (ref 1.15–1.40)
Chloride: 105 mmol/L (ref 98–111)
Creatinine, Ser: 0.7 mg/dL (ref 0.44–1.00)
Glucose, Bld: 88 mg/dL (ref 70–99)
HCT: 39 % (ref 36.0–46.0)
Hemoglobin: 13.3 g/dL (ref 12.0–15.0)
Potassium: 3.9 mmol/L (ref 3.5–5.1)
Sodium: 140 mmol/L (ref 135–145)
TCO2: 23 mmol/L (ref 22–32)

## 2022-04-25 LAB — TSH: TSH: 1.139 u[IU]/mL (ref 0.350–4.500)

## 2022-04-25 LAB — CBC
HCT: 39.4 % (ref 36.0–46.0)
Hemoglobin: 13.5 g/dL (ref 12.0–15.0)
MCH: 32.4 pg (ref 26.0–34.0)
MCHC: 34.3 g/dL (ref 30.0–36.0)
MCV: 94.5 fL (ref 80.0–100.0)
Platelets: 182 10*3/uL (ref 150–400)
RBC: 4.17 MIL/uL (ref 3.87–5.11)
RDW: 12.5 % (ref 11.5–15.5)
WBC: 8.6 10*3/uL (ref 4.0–10.5)
nRBC: 0 % (ref 0.0–0.2)

## 2022-04-25 LAB — CBG MONITORING, ED
Glucose-Capillary: 91 mg/dL (ref 70–99)
Glucose-Capillary: 79 mg/dL (ref 70–99)

## 2022-04-25 LAB — RAPID URINE DRUG SCREEN, HOSP PERFORMED
Amphetamines: NOT DETECTED
Barbiturates: NOT DETECTED
Benzodiazepines: NOT DETECTED
Cocaine: NOT DETECTED
Opiates: NOT DETECTED
Tetrahydrocannabinol: NOT DETECTED

## 2022-04-25 LAB — PROTIME-INR
INR: 1.1 (ref 0.8–1.2)
Prothrombin Time: 13.6 seconds (ref 11.4–15.2)

## 2022-04-25 LAB — AMMONIA: Ammonia: 14 umol/L (ref 9–35)

## 2022-04-25 LAB — I-STAT BETA HCG BLOOD, ED (MC, WL, AP ONLY): I-stat hCG, quantitative: <5 m[IU]/mL (ref <5)

## 2022-04-25 LAB — ETHANOL: Alcohol, Ethyl (B): <10 mg/dL (ref <10)

## 2022-04-25 LAB — APTT: aPTT: 33 seconds (ref 24–36)

## 2022-04-25 MED ORDER — METOCLOPRAMIDE HCL 5 MG/ML IJ SOLN
10.0000 mg | Freq: Once | INTRAMUSCULAR | Status: AC
  Administered 2022-04-25: 10 mg via INTRAVENOUS
  Filled 2022-04-25: qty 2

## 2022-04-25 MED ORDER — DIPHENHYDRAMINE HCL 50 MG/ML IJ SOLN
25.0000 mg | Freq: Once | INTRAMUSCULAR | Status: AC
  Administered 2022-04-25: 25 mg via INTRAVENOUS
  Filled 2022-04-25: qty 1

## 2022-04-25 MED ORDER — IOHEXOL 350 MG/ML SOLN
70.0000 mL | Freq: Once | INTRAVENOUS | Status: AC | PRN
  Administered 2022-04-25: 70 mL via INTRAVENOUS

## 2022-04-25 MED ORDER — IOHEXOL 350 MG/ML SOLN
60.0000 mL | Freq: Once | INTRAVENOUS | Status: AC | PRN
  Administered 2022-04-25: 60 mL via INTRAVENOUS

## 2022-04-25 MED ORDER — LACTATED RINGERS IV BOLUS
1000.0000 mL | Freq: Once | INTRAVENOUS | Status: AC
  Administered 2022-04-25: 1000 mL via INTRAVENOUS

## 2022-04-25 MED ORDER — KETOROLAC TROMETHAMINE 30 MG/ML IJ SOLN
15.0000 mg | Freq: Once | INTRAMUSCULAR | Status: AC
  Administered 2022-04-25: 15 mg via INTRAVENOUS
  Filled 2022-04-25: qty 1

## 2022-04-25 NOTE — Discharge Instructions (Addendum)
You declined admission to the hospital today and further testing to rule out stroke.  Stroke or mini stroke called TIA still possible. Your testing did not show any evidence of heart attack or blood clot in the lung.  Follow-up with your doctors as scheduled.  Return to the ED if you have new or worsening symptoms.

## 2022-04-25 NOTE — Consult Note (Addendum)
Neurology Consultation  Reason for Consult: Code stroke  Referring Physician: Dr. Manus Gunning  CC: chest pain and difficulty speaking   History is obtained from:patient  HPI: Dana Harvey is a 61 y.o. female with past medical history of anxiety/depression, CVA, PAF, GERD, s/p PPM, migraines, and seziures ("due to low K") who presents to [facility] ED via POV for evaluation of ongoing chest pain for 2-3 weeks and sudden onset of confusion and difficulty speaking while at work this am. LKW 0800. She states she awoke during the night with a headache @ 0300,  this am she was got to work at [employer] and she began to feel "fuzzy headed" went to bathroom and co workers noticed she was having difficulty speaking and sent her to the ED. She states her speech seems delayed. CT head negative for acute process  She endorses being under a lot of stress lately in the setting of a divorce/separation.   LKW: 0800 IV thrombolysis given?: no, acute anxiety attack significantly higher on the DDx than stroke, Low NIHSS  Premorbid modified Rankin scale (mRS):  0-Completely asymptomatic and back to baseline post-stroke  ROS: Full ROS was performed and is negative except as noted in the HPI.    Past Medical History:  Diagnosis Date   Allergy    Anxiety    Chest pain at rest    normal cors on cath in 2012. Normal coronary CT in 2018. Normal stress test in 05/2019   CVA (cerebral vascular accident) (HCC) 1997   Depression    Diverticulitis    Dyspnea    previous PFT showed normal lung volumes, severe diffusion defect with DLCO 43% predicted   Fainting spell    Frequent headaches    Gastroparesis    GERD (gastroesophageal reflux disease)    Heart murmur    Insomnia    Insomnia    Migraines    Pacemaker    St Jude dual chamber PPM placed on 09/06/2014 by Dr. Gaynelle Adu in [place]   PAF (paroxysmal atrial fibrillation) [facility])    diagnosed by Dr. Gaynelle Adu, remote history, no recurrent   Polyp of intestine  2018   benign per patient   Seizures (HCC)    secondary to potassium level dropping (due to opioid medications per patient)   Thyroid disease      Family History  Problem Relation Age of Onset   Osteoarthritis Mother    Alcohol abuse Mother    Arthritis Mother    COPD Mother    Depression Mother    Hearing loss Mother    Hyperlipidemia Mother    Hypertension Mother    Cancer Father    Diabetes Father    Heart disease Father 48       CABG   Hypertension Father    Alcohol abuse Brother    Cancer Brother    Depression Brother    Drug abuse Brother    Alcohol abuse Son    Drug abuse Son    Early death Maternal Grandmother 67   Heart disease Maternal Grandmother    Alcohol abuse Maternal Grandfather    Heart disease Maternal Grandfather 30   Cancer Paternal Grandmother    Heart disease Paternal Grandmother    Cancer Paternal Grandfather    Heart disease Paternal Grandfather    Alcohol abuse Brother    Depression Brother    Diabetes Brother    Drug abuse Brother    Hypertension Brother    Hyperlipidemia Brother  Social History:   reports that she has never smoked. She has never used smokeless tobacco. She reports that she does not drink alcohol and does not use drugs.  Medications No current facility-administered medications for this encounter.  Current Outpatient Medications:    ALPRAZolam (XANAX) 0.5 MG tablet, TAKE 1 TABLET BY MOUTH TWICE A DAY AS NEEDED FOR ANXIETY, Disp: 60 tablet, Rfl: 2   ascorbic acid (VITAMIN C) 500 MG tablet, , Disp: , Rfl:    atorvastatin (LIPITOR) 80 MG tablet, TAKE 1 TABLET BY MOUTH EVERY DAY, Disp: 90 tablet, Rfl: 1   buPROPion (WELLBUTRIN XL) 150 MG 24 hr tablet, Take 1 tablet (150 mg total) by mouth daily., Disp: 30 tablet, Rfl: 3   cholecalciferol (VITAMIN D3) 25 MCG (1000 UNIT) tablet, , Disp: , Rfl:    pantoprazole (PROTONIX) 40 MG tablet, Take 2 tablets (80 mg total) by mouth daily., Disp: 180 tablet, Rfl: 1   scopolamine  (TRANSDERM-SCOP) 1 MG/3DAYS, Place 1 patch (1.5 mg total) onto the skin every 3 (three) days., Disp: 10 patch, Rfl: 2   traZODone (DESYREL) 100 MG tablet, Take 1 tablet (100 mg total) by mouth at bedtime., Disp: 30 tablet, Rfl: 3   Vitamin E 180 MG (400 UNIT) CAPS, , Disp: , Rfl:    zolpidem (AMBIEN) 10 MG tablet, TAKE 1 TABLET BY MOUTH EVERY DAY AT BEDTIME AS NEEDED FOR SLEEP, Disp: 30 tablet, Rfl: 2   Exam: Current vital signs: BP 101/73 (BP Location: Right Arm)   Pulse 70   Temp 98.3 F (36.8 C) (Oral)   Resp 12   Ht 5\' 6"  (1.676 m)   Wt 56 kg   SpO2 98%   BMI 19.93 kg/m  Vital signs in last 24 hours: Temp:  [98.3 F (36.8 C)] 98.3 F (36.8 C) (06/30 0909) Pulse Rate:  [70] 70 (06/30 0909) Resp:  [12] 12 (06/30 0909) BP: (101)/(73) 101/73 (06/30 0909) SpO2:  [98 %] 98 % (06/30 0909) Weight:  [56 kg] 56 kg (06/30 0913)  GENERAL: Awake, alert in NAD HEENT: - Normocephalic and atraumatic, dry mm LUNGS - Clear to auscultation bilaterally with no wheezes CV - S1S2 RRR, no m/r/g, equal pulses bilaterally. ABDOMEN - Soft, nontender, nondistended with normoactive BS Ext: warm, well perfused, intact peripheral pulses, no edema  Exam is with several inconsistencies as well as poor effort  NEURO:  Mental Status: AA&Ox3  Language: speech is fluent in the context of frequent pauses that present in conjunction with increased intensity of anxious affect.  Naming, repetition and comprehension intact. No dysarthria.  Cranial Nerves: PERRL, EOMI, visual fields full, no facial asymmetry, hearing intact, tongue/uvula/soft palate normal in appearance without asymmetry with respect to the midline, normal sternocleidomastoid and trapezius muscle strength.  Motor: Maximum strength elicited in bilateral upper extremities 5/5 proximally and distally, lowers bilaterally 4/5. All limbs with giveway weakness that responds to coaching/encouragement. No asymmetry noted.  Tone and bulk are normal No  pronator drift.  Shivering motions involving the patient's limbs and trunk are stated to be due to feeling cold; the movements wax and wane, resolve while distracted and appear non-physiological Sensation- Subjectively with decreased sensation on left  Coordination: FTN intact bilaterally, no ataxia in BLE. Gait- Deferred  NIHSS 1a Level of Conscious.: 0 1b LOC Questions: 0 1c LOC Commands: 0 2 Best Gaze: 0 3 Visual: 0 4 Facial Palsy: 0 5a Motor Arm - left: 0 5b Motor Arm - Right: 0 6a Motor Leg - Left:  1 (with giveway) 6b Motor Leg - Right: 1  (with giveway) 7 Limb Ataxia: 0 8 Sensory: 1 9 Best Language: 1 (stuttering) 10 Dysarthria: 0 11 Extinct. and Inatten.: 0 TOTAL: 4   Imaging I have reviewed the images obtained:  CT-head- There is no acute intracranial hemorrhage or evidence of acute infarction. ASPECT score is 10.  Assessment:  61 y.o. female with a past medical history of anxiety/depression, CVA, PAF, GERD, s/p PPM, migraines, who presents to [facility] ED via POV for evaluation of ongoing chest pain for 2-3 weeks and sudden onset of confusion and difficulty speaking while at work this am. LKW 0800. She states she awoke during the night with a headache, this am she got to work at [employer] and she began to feel "fuzzy headed" went to bathroom and co workers noticed she was having difficulty speaking.  - Exam is with multiple inconsistencies, including distractible non-physiological stuttering and giveway weakness.  - CT head: Negative.  - CTA of head and neck: Patent vasculature of the head and neck with no hemodynamically significant stenosis, occlusion, or dissection. - UTOX negative. U/A negative - DDx: Most likely component of the DDx is anxiety attack manifesting as psychogenic pseudostroke. Significantly lower on the DDx are complex migraine, TIA and acute stroke   Recommendations: - MRI brain and MRA head/neck- (has PPM will need to evaluate if compatible with MRI) -  Psychology consult for stress management  - continue home lipitor  - HgbA1c, fasting lipid panel - Frequent neuro checks - Echocardiogram - Risk factor modification - Telemetry monitoring - PT consult, OT consult, Speech consult - Stroke team to follow   Gevena Mart DNP, ACNPC- AG   I have seen and examined the patient. I have formulated the assessment and recommendations. 61 year old female presenting with a constellation of symptoms and signs that are most consistent with psychogenic pseudostroke. NIHSS 4, but exam overall is nonlocalizable. Recommendations as above.  Electronically signed: Dr. Caryl Pina

## 2022-04-25 NOTE — ED Notes (Signed)
Pt continues to refuse covid swab, RN aware.

## 2022-04-25 NOTE — ED Notes (Signed)
Pt walked to bathroom well on her own

## 2022-04-25 NOTE — ED Provider Triage Note (Cosign Needed)
Emergency Medicine Provider Triage Evaluation Note  Jaclynn Laumann , a 61 y.o. female  was evaluated in triage.  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 States awoke with a frontal headache which is not typical for her, went to work to take something for headache (out of tylenol at home). Co-workers noticed she was disoriented and brought her to the ER. Feels generally weak, no numbness.  CP intermittent for several weeks.  Review of Systems  Positive: weakness Negative: numbness  Physical Exam  BP 101/73 (BP Location: Right Arm)   Pulse 70   Temp 98.3 F (36.8 C) (Oral)   Resp 12   SpO2 98%  Gen:   Awake, no distress   Resp:  Normal effort  MSK:   Moves extremities without difficulty  Other:  Irritable, alert to person, year, place (will not say presidents name/intentionally refuses)  Medical Decision Making  Medically screening exam initiated at 9:12 AM.  Appropriate orders placed.  Casandra Doffing Gadomski was informed that the remainder of the evaluation will be completed by another provider, this initial triage assessment does not replace that evaluation, and the importance of remaining in the ED until their evaluation is complete.  Difficulty following commands, discussed with Dr. Manus Gunning, will activate code stroke. Charge/unit secretary aware.   Jeannie Fend, PA-C 04/25/22 [phone]

## 2022-04-25 NOTE — Progress Notes (Signed)
Pt feeling much better now. NIHSS 0.

## 2022-04-25 NOTE — ED Notes (Signed)
Patient verbalizes understanding of discharge instructions. Opportunity for questioning and answers were provided. Armband removed by staff, pt discharged from ED. Pt ambulatory to ED waiting room. 

## 2022-04-25 NOTE — Code Documentation (Addendum)
Dana Harvey is a 61 yr old female with PMH of anxiety, prior stroke, migraine, pacemaker. She is on no blood thinners.      Pt was last known well this morning at 0800. She was fine when she went to work. She then was "fuzzy headed" and co-worker told her she was having difficulty speaking. She has been having chest pain and headaches today as well.    Pt met by stroke team in CT. CT neg for acute hemorrhage per Dr. Cheral Marker. NIHSS 4 for drift in both legs, left sensory decrease, and slight delay in speech. No thrombolytic as low suspicion for stroke, low NIHSS. Not thrombectomy candidate as exam LVO negative.    Pt returned to room 3 where work-up will continue.She will need q 30 min VS and NIHSS till outside of TNK window at 1230.  She will then need q 2 hr VS and neuro checks for the next 12 hours. Bedside handoff with Alena RN.

## 2022-04-25 NOTE — ED Notes (Signed)
Pt refused COVID swab

## 2022-04-25 NOTE — ED Provider Notes (Signed)
[facility] EMERGENCY DEPARTMENT Provider Note   CSN: [id number] Arrival date & time: 04/25/22  3606     History  Chief Complaint  Patient presents with   Chest Pain    Dana Harvey is a 61 y.o. female.  Patient with a history of previous CVA, seizures, pacemaker, atrial fibrillation, presented to triage with acute change in mental status.  Patient's coworker states patient was normal about 8:00 this morning but they noticed an acute change in mental status around [phone].  Patient was having confusion, difficulty following commands, unable to respond appropriately per her baseline.  Coworkers state this is definitely not normal for her.  Patient is noted to be irritable and having difficulty following commands and disoriented to place.  She also endorses a headache which she had since approximately 3 AM which is not atypical for her.  Coworker states has been having chest pain on and off for the past several weeks but none currently. Confusion began this morning while patient was at work in the noted she was having difficulty speaking as well as some blurred vision.  The history is provided by the patient. The history is limited by the condition of the patient.  Chest Pain      Home Medications Prior to Admission medications   Medication Sig Start Date End Date Taking? Authorizing Provider  ALPRAZolam Prudy Feeler) 0.5 MG tablet TAKE 1 TABLET BY MOUTH TWICE A DAY AS NEEDED FOR ANXIETY 04/07/22   Worthy Rancher B, FNP  ascorbic acid (VITAMIN C) 500 MG tablet  07/06/20   [provider]  atorvastatin (LIPITOR) 80 MG tablet TAKE 1 TABLET BY MOUTH EVERY DAY 04/11/21   Koberlein, Paris Lore, MD  buPROPion (WELLBUTRIN XL) 150 MG 24 hr tablet Take 1 tablet (150 mg total) by mouth daily. 04/11/22   Worthy Rancher B, FNP  cholecalciferol (VITAMIN D3) 25 MCG (1000 UNIT) tablet  03/27/20   [provider]  pantoprazole (PROTONIX) 40 MG tablet Take 2 tablets (80  mg total) by mouth daily. 08/24/20   [name], MD  scopolamine (TRANSDERM-SCOP) 1 MG/3DAYS Place 1 patch (1.5 mg total) onto the skin every 3 (three) days. 07/11/21   Meredith Pel, NP  traZODone (DESYREL) 100 MG tablet Take 1 tablet (100 mg total) by mouth at bedtime. 04/11/22   Worthy Rancher B, FNP  Vitamin E 180 MG (400 UNIT) CAPS  08/08/20   [provider]  zolpidem (AMBIEN) 10 MG tablet TAKE 1 TABLET BY MOUTH EVERY DAY AT BEDTIME AS NEEDED FOR SLEEP 01/25/22   [name], MD      Allergies    Metoprolol, Nystatin, Penicillins, Aspirin, Crestor [rosuvastatin calcium], Fleet enema [enema], Gluten meal, Midodrine hcl, Miralax [polyethylene glycol], and Motegrity [prucalopride]    Review of Systems   Review of Systems  Unable to perform ROS: Mental status change  Cardiovascular:  Positive for chest pain.    Physical Exam Updated Vital Signs BP 101/73 (BP Location: Right Arm)   Pulse 70   Temp 98.3 F (36.8 C) (Oral)   Resp 12   Ht 5\' 6"  (1.676 m)   Wt 56 kg   SpO2 98%   BMI 19.93 kg/m  Physical Exam Vitals and nursing note reviewed.  Constitutional:      General: She is not in acute distress.    Appearance: She is well-developed. She is not ill-appearing.     Comments: Patient is alert she is oriented to person  only.  She is irritable and seems to have difficulty answering questions.  She was slow to follow commands but moves all extremities appropriately  HENT:     Head: Normocephalic and atraumatic.     Mouth/Throat:     Pharynx: No oropharyngeal exudate.  Eyes:     Conjunctiva/sclera: Conjunctivae normal.     Pupils: Pupils are equal, round, and reactive to light.  Neck:     Comments: No meningismus. Cardiovascular:     Rate and Rhythm: Normal rate and regular rhythm.     Heart sounds: Normal heart sounds. No murmur heard.    Comments: Pacemaker in place Pulmonary:     Effort: Pulmonary effort is normal. No respiratory distress.      Breath sounds: Normal breath sounds.  Chest:     Chest wall: Tenderness present.  Abdominal:     Palpations: Abdomen is soft.     Tenderness: There is no abdominal tenderness. There is no guarding or rebound.  Musculoskeletal:        General: No tenderness. Normal range of motion.     Cervical back: Normal range of motion and neck supple.  Skin:    General: Skin is warm.  Neurological:     Mental Status: She is alert and oriented to person, place, and time.     Cranial Nerves: No cranial nerve deficit.     Motor: No abnormal muscle tone.     Coordination: Coordination normal.     Comments: No appreciable facial droop.  Tongue is midline.  Slow to follow commands is globally weak but is able to lift arms and legs against gravity bilaterally.  Requires significant coaching to be able to do so. Some drift in legs bilaterally. No appreciable ataxia or nystagmus  Psychiatric:        Behavior: Behavior normal.     ED Results / Procedures / Treatments   Labs (all labs ordered are listed, but only abnormal results are displayed) Labs Reviewed  COMPREHENSIVE METABOLIC PANEL - Abnormal; Notable for the following components:      Result Value   Total Protein 6.1 (*)    All other components within normal limits  URINALYSIS, ROUTINE W REFLEX MICROSCOPIC - Abnormal; Notable for the following components:   Color, Urine STRAW (*)    All other components within normal limits  I-STAT CHEM 8, ED - Abnormal; Notable for the following components:   Calcium, Ion 1.14 (*)    All other components within normal limits  RESP PANEL BY RT-PCR (FLU A&B, COVID) ARPGX2  ETHANOL  PROTIME-INR  APTT  CBC  DIFFERENTIAL  RAPID URINE DRUG SCREEN, HOSP PERFORMED  AMMONIA  TSH  I-STAT BETA HCG BLOOD, ED ([facility], [facility], [facility] ONLY)  CBG MONITORING, ED  CBG MONITORING, ED  TROPONIN I (HIGH SENSITIVITY)  TROPONIN I (HIGH SENSITIVITY)    EKG EKG Interpretation  Date/Time:  Friday April 25 2022 09:10:56  EDT Ventricular Rate:  70 PR Interval:  176 QRS Duration: 80 QT Interval:  394 QTC Calculation: 425 R Axis:   98 Text Interpretation: Atrial-paced rhythm Rightward axis Abnormal ECG When compared with ECG of 19-Apr-2021 09:37, PREVIOUS ECG IS PRESENT No significant change was found Confirmed by Ezequiel Essex [phone]) on 04/25/2022 9:35:19 AM  Radiology CT ANGIO HEAD NECK W WO CM  Result Date: 04/25/2022 CLINICAL DATA:  Code stroke EXAM: CT ANGIOGRAPHY HEAD AND NECK TECHNIQUE: Multidetector CT imaging of the head and neck was performed using the standard protocol during bolus  administration of intravenous contrast. Multiplanar CT image reconstructions and MIPs were obtained to evaluate the vascular anatomy. Carotid stenosis measurements (when applicable) are obtained utilizing NASCET criteria, using the distal internal carotid diameter as the denominator. RADIATION DOSE REDUCTION: This exam was performed according to the departmental dose-optimization program which includes automated exposure control, adjustment of the mA and/or kV according to patient size and/or use of iterative reconstruction technique. CONTRAST:  67mL OMNIPAQUE IOHEXOL 350 MG/ML SOLN COMPARISON:  Same-day noncontrast CT head, CTA head and neck 09/27/2019 FINDINGS: CTA NECK FINDINGS Aortic arch: The imaged aortic arch is normal. The origins of the major branch vessels are patent. The subclavian arteries are patent to the level imaged. Right carotid system: The right common, internal, and external carotid arteries are patent, with minimal plaque at the bifurcation but no hemodynamically significant stenosis or occlusion. There is no dissection or aneurysm. Left carotid system: The left common, internal, and external carotid arteries are patent, without hemodynamically significant stenosis or occlusion. There is no dissection or aneurysm. Vertebral arteries: There is mild stenosis at the origin of the left vertebral artery. The  vertebral arteries are otherwise patent, without other hemodynamically significant stenosis or occlusion. There is no dissection or aneurysm. Skeleton: There is no acute osseous abnormality or suspicious osseous lesion. There is no visible canal hematoma. Other neck: There is a subcentimeter right thyroid nodule for which no specific imaging follow-up is required. Soft tissues of the neck are otherwise unremarkable. Upper chest: The imaged lung apices are clear. Review of the MIP images confirms the above findings CTA HEAD FINDINGS Anterior circulation: The intracranial ICAs are patent. The bilateral MCAs are patent. The bilateral ACAs are patent. The anterior communicating artery is normal. There is no aneurysm or AVM. Posterior circulation: The bilateral V4 segments are patent. The basilar artery is patent. The major cerebellar artery origins are patent. The bilateral PCAs are patent. There is a fetal origin of the right PCA and a diminutive left posterior communicating artery There is no aneurysm or AVM. Venous sinuses: Patent. Anatomic variants: As above. Review of the MIP images confirms the above findings IMPRESSION: Patent vasculature of the head and neck with no hemodynamically significant stenosis, occlusion, or dissection. Electronically Signed   By: Lesia Hausen M.D.   On: 04/25/2022 12:08   CT Angio Chest/Abd/Pel for Dissection W and/or Wo Contrast  Result Date: 04/25/2022 CLINICAL DATA:  Acute aortic syndrome, chest pain, question aortic dissection EXAM: CT ANGIOGRAPHY CHEST, ABDOMEN AND PELVIS TECHNIQUE: Non-contrast CT of the chest was initially obtained. Multidetector CT imaging through the chest, abdomen and pelvis was performed using the standard protocol during bolus administration of intravenous contrast. Multiplanar reconstructed images and MIPs were obtained and reviewed to evaluate the vascular anatomy. RADIATION DOSE REDUCTION: This exam was performed according to the departmental  dose-optimization program which includes automated exposure control, adjustment of the mA and/or kV according to patient size and/or use of iterative reconstruction technique. CONTRAST:  27mL OMNIPAQUE IOHEXOL 350 MG/ML SOLN IV COMPARISON:  CT abdomen and pelvis 03/15/2020, 01/13/2020 FINDINGS: CTA CHEST FINDINGS Cardiovascular: Precontrast images show no evidence of intramural hematoma. Normal aortic enhancement without aneurysm or dissection. Pacemaker leads RIGHT atrium and RIGHT ventricle. Heart unremarkable. No pericardial effusion. Pulmonary arteries patent without evidence of pulmonary embolism. Mediastinum/Nodes: 8 mm RIGHT thyroid nodule; no follow-up imaging recommended. No thoracic adenopathy. Esophagus unremarkable. Lungs/Pleura: RIGHT lower lobe noncalcified pulmonary nodule image 105 unchanged since 2021; with 2 years of stability, no follow-up imaging recommended. Remaining  lungs clear. No pulmonary infiltrate, pleural effusion, or pneumothorax. Musculoskeletal: Osseous structures unremarkable. Review of the MIP images confirms the above findings. CTA ABDOMEN AND PELVIS FINDINGS VASCULAR Aorta: Atherosclerotic calcifications aorta. Aorta normal caliber without aneurysm or dissection. Minimal noncalcified plaque. Celiac: Mild narrowing of proximal celiac artery, less than 50%. No plaque. SMA: Narrowing at proximal SMA, less than 50%.  No plaque. Renals: Main renal arteries widely patent. Accessory LEFT renal artery. IMA: Patent, though arises at calcified plaque Inflow: Calcified plaque narrows common iliac arteries bilaterally. Veins: Duplication of IVC. Review of the MIP images confirms the above findings. NON-VASCULAR Hepatobiliary: Gallbladder surgically absent. Liver normal appearance. Pancreas: Normal appearance Spleen: Normal appearance Adrenals/Urinary Tract: Adrenal glands, kidneys, ureters, and bladder normal appearance Stomach/Bowel: Appendix surgically absent. Stomach and bowel loops  unremarkable for technique Lymphatic: No adenopathy Reproductive: Unremarkable prostate gland and seminal vesicles Other: No free air or free fluid. No hernia or inflammatory process. Musculoskeletal: Unremarkable Review of the MIP images confirms the above findings. IMPRESSION: No evidence of aortic aneurysm or dissection. Mild narrowing of the proximal celiac and SMA, less than 50% diameter. Duplication of IVC. No acute intrathoracic, intra-abdominal, or intrapelvic abnormalities. Aortic Atherosclerosis (ICD10-I70.0). Electronically Signed   By: Ulyses Southward M.D.   On: 04/25/2022 10:14   CT HEAD CODE STROKE WO CONTRAST  Result Date: 04/25/2022 CLINICAL DATA:  Code stroke.  Neuro deficit, acute, stroke suspected EXAM: CT HEAD WITHOUT CONTRAST TECHNIQUE: Contiguous axial images were obtained from the base of the skull through the vertex without intravenous contrast. RADIATION DOSE REDUCTION: This exam was performed according to the departmental dose-optimization program which includes automated exposure control, adjustment of the mA and/or kV according to patient size and/or use of iterative reconstruction technique. COMPARISON:  None Available. FINDINGS: Motion artifact is present on some slices. Brain: Ventricles and sulci are normal in size and configuration. Gray-white differentiation is preserved. Ventricles and sulci are normal in size and configuration. No extra-axial collection. Vascular: No hyperdense vessel. Skull: Unremarkable. Sinuses/Orbits: Aerated.  Unremarkable orbits. Other: Mastoid air cells are clear. ASPECTS (Alberta Stroke Program Early CT Score) - Ganglionic level infarction (caudate, lentiform nuclei, internal capsule, insula, M1-M3 cortex): 7 - Supraganglionic infarction (M4-M6 cortex): 3 Total score (0-10 with 10 being normal): 10 IMPRESSION: There is no acute intracranial hemorrhage or evidence of acute infarction. ASPECT score is 10. These results were communicated to Dr. Otelia Limes at  9:39 am on 04/25/2022 by text page via the [facility] messaging system. Electronically Signed   By: Guadlupe Spanish M.D.   On: 04/25/2022 09:41    Procedures Procedures    Medications Ordered in ED Medications - No data to display  ED Course/ Medical Decision Making/ A&P                           Medical Decision Making Amount and/or Complexity of Data Reviewed Labs: ordered. Decision-making details documented in ED Course. Radiology: ordered and independent interpretation performed. Decision-making details documented in ED Course. ECG/medicine tests: ordered and independent interpretation performed. Decision-making details documented in ED Course.  Risk Prescription drug management.  Code stroke activated in triage for acute change in mental status with difficulty speaking and difficulty following commands.  CT scan shows no hemorrhage.  Results reviewed and interpreted by me.  Upon returning from CT scan, patient mental status is improving and she is answering questions more appropriately.  She is oriented to person place and time.  She states she  woke up around 3 AM this morning with a headache that was typical of her usual migraines but was able to go back to sleep.  She did have a headache when she went to work around 8 AM.  She remembers "vacuum feeling in my head" but no sudden worsening of a headache.  She began to feel confused while she was working on the computer and had difficulty completing her tasks.  She remembers be spoken to by her colleagues and couldn't understand what they are saying.  She also admits to having intermittent chest pain on and off for the past 2 to 3 weeks.  She describes squeezing pain to the left side of her chest lasting for 5 to 10 seconds at a time.  This pain is coming and going and is unchanged today.  EKG is paced without acute ST changes.  Troponin is negative.  Neuroimaging as above is negative.  No evidence of large vessel occlusion or stenosis or  aneurysm.  Results reviewed and interpreted by me.  Neurology has seen patient and agrees she is not a candidate for thrombolytics given minimal deficits and rapidly improving.  Patient feels back to baseline and is speaking normally.  She is not quite sure of the events of this morning. Unclear etiology of events this morning.  Considered TIA, CVA, complex migraine.  Neurology recommending further work-up including MRI, echocardiogram and carotid Dopplers.  Patient states her pacemaker is not MRI compatible.  Patient's chest pain appears atypical for ACS.  Troponin negative x2.  CTA chest negative for pulmonary embolism or aortic dissection.  Results reviewed and interpreted by me.  States she is going to see her cardiologist in 2 weeks.  Did recently have a stress test last year.  Patient states she does not want to stay in the hospital today and feels back to normal and wants to go home.  Discussed with stroke or TIA has not been ruled out completely.  Patient adamant that she does not want to stay.  She denies SI or HI. States she will follow-up as an outpatient.  Discussed she will be leaving AGAINST MEDICAL ADVICE and has not been determined that she does not have a stroke or TIA. She appears to have capacity to make this decision. She is aware that she can return to the ED at any time.        Final Clinical Impression(s) / ED Diagnoses Final diagnoses:  Mental status change resolved  Atypical chest pain    Rx / DC Orders ED Discharge Orders     None         Shayma Pfefferle, Annie Main, MD 04/25/22 1805

## 2022-04-25 NOTE — ED Triage Notes (Signed)
Pt arrives POV for eval of chest pain. Pt reports episodes of chest pain over the last 2-3 weeks. Reports that she started this AM w/ an episode of chest pain. Coworker reports she became disoriented and was having difficulty speaking/blurred vision at that time, which she reports appears to be improving but still present. Pt does endorse ongoing chest pain at this time as well.

## 2022-04-25 NOTE — ED Notes (Signed)
Pt ambulatory to bathroom

## 2022-05-01 ENCOUNTER — Encounter: Payer: BC Managed Care – PPO | Admitting: Internal Medicine

## 2022-05-05 ENCOUNTER — Other Ambulatory Visit: Payer: Self-pay | Admitting: Family

## 2022-05-05 ENCOUNTER — Other Ambulatory Visit: Payer: Self-pay | Admitting: Pediatric Radiology

## 2022-05-05 DIAGNOSIS — Z1231 Encounter for screening mammogram for malignant neoplasm of breast: Secondary | ICD-10-CM

## 2022-05-09 ENCOUNTER — Telehealth: Payer: Self-pay | Admitting: *Deleted

## 2022-05-09 ENCOUNTER — Other Ambulatory Visit: Payer: Self-pay | Admitting: *Deleted

## 2022-05-09 MED ORDER — ZOLPIDEM TARTRATE 10 MG PO TABS: ORAL_TABLET | ORAL | 2 refills | Status: AC

## 2022-05-09 MED ORDER — ATORVASTATIN CALCIUM 80 MG PO TABS: 80.0000 mg | ORAL_TABLET | Freq: Every day | ORAL | 1 refills | Status: AC

## 2022-05-09 NOTE — Telephone Encounter (Signed)
Printed Rx for Zolpidem 10mg  was faxed to [facility] [address] at [phone].

## 2022-05-13 ENCOUNTER — Encounter: Payer: BC Managed Care – PPO | Admitting: Internal Medicine

## 2022-05-13 ENCOUNTER — Telehealth: Payer: Self-pay

## 2022-05-13 NOTE — Telephone Encounter (Signed)
VM 1110:

## 2022-06-02 ENCOUNTER — Telehealth: Payer: Self-pay | Admitting: Family Medicine

## 2022-06-02 DIAGNOSIS — Z1239 Encounter for other screening for malignant neoplasm of breast: Secondary | ICD-10-CM

## 2022-06-02 NOTE — Telephone Encounter (Signed)
Spoke with the patient and informed her the order was entered as below.

## 2022-06-02 NOTE — Telephone Encounter (Signed)
Pt has mammo scheduled for 06/11/22 and needs an order entered.

## 2022-06-10 ENCOUNTER — Ambulatory Visit: Payer: BC Managed Care – PPO

## 2022-06-11 ENCOUNTER — Ambulatory Visit: Payer: BC Managed Care – PPO

## 2022-06-11 ENCOUNTER — Other Ambulatory Visit: Payer: Self-pay | Admitting: Family Medicine

## 2022-06-11 DIAGNOSIS — Z1231 Encounter for screening mammogram for malignant neoplasm of breast: Secondary | ICD-10-CM

## 2022-06-12 ENCOUNTER — Ambulatory Visit (INDEPENDENT_AMBULATORY_CARE_PROVIDER_SITE_OTHER): Payer: BC Managed Care – PPO | Admitting: Family Medicine

## 2022-06-12 ENCOUNTER — Encounter: Payer: Self-pay | Admitting: Family Medicine

## 2022-06-12 VITALS — BP 108/68 | HR 70 | Temp 98.6°F | Ht 66.0 in | Wt 122.1 lb

## 2022-06-12 DIAGNOSIS — R232 Flushing: Secondary | ICD-10-CM

## 2022-06-12 DIAGNOSIS — Z803 Family history of malignant neoplasm of breast: Secondary | ICD-10-CM | POA: Diagnosis not present

## 2022-06-12 LAB — FOLLICLE STIMULATING HORMONE: FSH: 108.3 m[IU]/mL

## 2022-06-12 LAB — TSH: TSH: 0.79 u[IU]/mL (ref 0.35–5.50)

## 2022-06-12 NOTE — Patient Instructions (Signed)
Black cohosh or Estroven herbal supplements

## 2022-06-12 NOTE — Progress Notes (Signed)
Established Patient Office Visit  Subjective   Patient ID: Dana Harvey, female    DOB: 01/02/1961  Age: 61 y.o. MRN: [id number]  Chief Complaint  Patient presents with   Patient complains of hot sensation from chest to head   Palpitations    Patient complains of palpitations daily and states she has an upcoming appointment with Dr Graciela Husbands (cardiologist) at the end of this month    Patient reports that she is feeling "hot" from her shoulders up to her head. Has been going on for about 2 weeks. No sweating or nausea. States that it is constant, not going away. States that she tried to take cold showers without improvement.no new medications, no new real rashes. No new products. States that she has been taking her temperature and it has been normal. She is also describing some headaches as well.   Palpitations  This is a chronic problem. The current episode started more than 1 year ago. The problem occurs constantly. The problem has been unchanged. Nothing aggravates the symptoms. Associated symptoms include anxiety. Pertinent negatives include no chest pain, diaphoresis, fever, irregular heartbeat, shortness of breath or weakness. Risk factors include post menopause. There is no history of hyperthyroidism.    Current Outpatient Medications  Medication Instructions   ALPRAZolam (XANAX) 0.5 MG tablet TAKE 1 TABLET BY MOUTH TWICE A DAY AS NEEDED FOR ANXIETY   ascorbic acid (VITAMIN C) 500 mg, Oral, Daily   atorvastatin (LIPITOR) 80 mg, Oral, Daily   cholecalciferol (VITAMIN D3) 1,000 Units, Oral, Daily at bedtime   estradiol (ESTRACE) 0.5 mg, Oral, Daily   OVER THE COUNTER MEDICATION 1 tablet, Oral, Daily, CoQnol   pantoprazole (PROTONIX) 80 mg, Oral, Daily   scopolamine (TRANSDERM-SCOP) 1.5 mg, Transdermal, every 72 hours   traZODone (DESYREL) 100 mg, Oral, Daily at bedtime   VITAMIN E PO 1 tablet, Oral, Daily   zolpidem (AMBIEN) 10 MG tablet TAKE 1 TABLET BY MOUTH EVERY DAY AT  BEDTIME AS NEEDED FOR SLEEP<BR>Strength: 10 mg     Review of Systems  Constitutional:  Negative for diaphoresis and fever.  Respiratory:  Negative for shortness of breath.   Cardiovascular:  Positive for palpitations. Negative for chest pain.  Neurological:  Negative for weakness.  Psychiatric/Behavioral:  The patient is nervous/anxious.   All other systems reviewed and are negative.     Objective:     BP 108/68 (BP Location: Left Arm, Patient Position: Sitting, Cuff Size: Normal)   Pulse 70   Temp 98.6 F (37 C) (Oral)   Ht 5\' 6"  (1.676 m)   Wt 122 lb 1.6 oz (55.4 kg)   SpO2 96%   BMI 19.71 kg/m    Physical Exam Vitals reviewed.  Constitutional:      Appearance: Normal appearance. She is well-groomed and normal weight. She is not diaphoretic.  Cardiovascular:     Rate and Rhythm: Normal rate and regular rhythm.     Heart sounds: S1 normal and S2 normal.  Pulmonary:     Effort: Pulmonary effort is normal.     Breath sounds: Normal breath sounds and air entry.  Abdominal:     General: Abdomen is flat. Bowel sounds are normal.     Palpations: Abdomen is soft.  Musculoskeletal:     Right lower leg: No edema.     Left lower leg: No edema.  Skin:    General: Skin is warm and dry.  Neurological:     Mental Status: She is alert and  oriented to person, place, and time. Mental status is at baseline.     Gait: Gait is intact.  Psychiatric:        Mood and Affect: Mood and affect normal.        Speech: Speech normal.        Behavior: Behavior normal.        Judgment: Judgment normal.      Results for orders placed or performed in visit on 06/12/22  TSH  Result Value Ref Range   TSH 0.79 0.35 - 5.50 uIU/mL  Follicle Stimulating Hormone  Result Value Ref Range   FSH 108.3 mIU/ML  Estradiol  Result Value Ref Range   Estradiol <15 pg/mL      The 10-year ASCVD risk score (Arnett DK, et al., 2019) is: 2.3%    Assessment & Plan:   Problem List Items Addressed  This Visit       Cardiovascular and Mediastinum   Hot flashes - Primary (Chronic)    Patient is s/p hysterectomy so she does not know if she has undergone menopause, we discussed the labs we order to determine if someone is postmenopausal. I have ordered TSH, FSH and estrogen level. I advised she might try using black cohosh or Estroven OTC to help relieve her symptoms. We also had a long discussion about SSRI's and how this class of medication would be helpful in relieving her symptoms as well and reducing her high anxiety level. Patient states she will think about it.       Other Relevant Orders   TSH (Completed)   Follicle Stimulating Hormone (Completed)   Estradiol (Completed)     Other   Family history of breast cancer in first degree relative    Mother and other relatives have a history of breast cancer. We discussed the possibility of getting the genetic testing to assess her risk of breast cancer. This may also complicate the use of hormone replacement therapy       Relevant Orders   BRCAssure Comprehensive Panel    No follow-ups on file.    Karie Georges, MD

## 2022-06-13 ENCOUNTER — Encounter: Payer: Self-pay | Admitting: Family Medicine

## 2022-06-13 DIAGNOSIS — R232 Flushing: Secondary | ICD-10-CM | POA: Insufficient documentation

## 2022-06-13 DIAGNOSIS — Z803 Family history of malignant neoplasm of breast: Secondary | ICD-10-CM | POA: Insufficient documentation

## 2022-06-13 LAB — ESTRADIOL: Estradiol: <15 pg/mL

## 2022-06-13 MED ORDER — ESTRADIOL 0.5 MG PO TABS: 0.5000 mg | ORAL_TABLET | Freq: Every day | ORAL | 1 refills | Status: DC

## 2022-06-13 NOTE — Assessment & Plan Note (Signed)
Patient is s/p hysterectomy so she does not know if she has undergone menopause, we discussed the labs we order to determine if someone is postmenopausal. I have ordered TSH, FSH and estrogen level. I advised she might try using black cohosh or Estroven OTC to help relieve her symptoms. We also had a long discussion about SSRI's and how this class of medication would be helpful in relieving her symptoms as well and reducing her high anxiety level. Patient states she will think about it.

## 2022-06-13 NOTE — Assessment & Plan Note (Signed)
Mother and other relatives have a history of breast cancer. We discussed the possibility of getting the genetic testing to assess her risk of breast cancer. This may also complicate the use of hormone replacement therapy

## 2022-06-16 ENCOUNTER — Ambulatory Visit (INDEPENDENT_AMBULATORY_CARE_PROVIDER_SITE_OTHER): Payer: BC Managed Care – PPO

## 2022-06-16 DIAGNOSIS — I495 Sick sinus syndrome: Secondary | ICD-10-CM

## 2022-06-17 LAB — CUP PACEART REMOTE DEVICE CHECK
Battery Remaining Longevity: 33 mo
Battery Remaining Percentage: 30 %
Battery Voltage: 2.95 V
Brady Statistic AP VP Percent: 1 %
Brady Statistic AP VS Percent: 81 %
Brady Statistic AS VP Percent: 1 %
Brady Statistic AS VS Percent: 18 %
Brady Statistic RA Percent Paced: 78 %
Brady Statistic RV Percent Paced: 1 %
Date Time Interrogation Session: 20230821020012
Implantable Lead Implant Date: 20151111
Implantable Lead Implant Date: 20151111
Implantable Lead Location: 753859
Implantable Lead Location: 753860
Implantable Pulse Generator Implant Date: 20151111
Lead Channel Impedance Value: 400 Ohm
Lead Channel Impedance Value: 450 Ohm
Lead Channel Pacing Threshold Amplitude: 0.5 V
Lead Channel Pacing Threshold Amplitude: 0.5 V
Lead Channel Pacing Threshold Pulse Width: 0.4 ms
Lead Channel Pacing Threshold Pulse Width: 0.4 ms
Lead Channel Sensing Intrinsic Amplitude: 10.1 mV
Lead Channel Sensing Intrinsic Amplitude: 5 mV
Lead Channel Setting Pacing Amplitude: 2 V
Lead Channel Setting Pacing Amplitude: 2.5 V
Lead Channel Setting Pacing Pulse Width: 0.4 ms
Lead Channel Setting Sensing Sensitivity: 0.5 mV
Pulse Gen Model: 2240
Pulse Gen Serial Number: 7694052

## 2022-06-26 ENCOUNTER — Ambulatory Visit: Payer: BC Managed Care – PPO | Attending: Internal Medicine | Admitting: Internal Medicine

## 2022-06-26 VITALS — BP 122/72 | HR 70 | Ht 66.0 in | Wt 123.0 lb

## 2022-06-26 DIAGNOSIS — I495 Sick sinus syndrome: Secondary | ICD-10-CM

## 2022-06-26 DIAGNOSIS — Z950 Presence of cardiac pacemaker: Secondary | ICD-10-CM | POA: Diagnosis not present

## 2022-06-26 DIAGNOSIS — I951 Orthostatic hypotension: Secondary | ICD-10-CM | POA: Diagnosis not present

## 2022-06-26 LAB — CUP PACEART INCLINIC DEVICE CHECK
Date Time Interrogation Session: 20230831165817
Implantable Lead Implant Date: 20151111
Implantable Lead Implant Date: 20151111
Implantable Lead Location: 753859
Implantable Lead Location: 753860
Implantable Pulse Generator Implant Date: 20151111
Pulse Gen Model: 2240
Pulse Gen Serial Number: 7694052

## 2022-06-26 NOTE — Patient Instructions (Signed)
Medication Instructions:  No changes *If you need a refill on your cardiac medications before your next appointment, please call your pharmacy*   Lab Work: none If you have labs (blood work) drawn today and your tests are completely normal, you will receive your results only by: MyChart Message (if you have MyChart) OR A paper copy in the mail If you have any lab test that is abnormal or we need to change your treatment, we will call you to review the results.   Testing/Procedures: none   Follow-Up: At [facility], you and your health needs are our priority.  As part of our continuing mission to provide you with exceptional heart care, we have created designated Provider Care Teams.  These Care Teams include your primary Cardiologist (physician) and Advanced Practice Providers (APPs -  Physician Assistants and Nurse Practitioners) who all work together to provide you with the care you need, when you need it.   Your next appointment:   12 month(s)  The format for your next appointment:   In Person  Provider:   Dr. Graciela Husbands  Other Instructions   Important Information About Sugar

## 2022-06-26 NOTE — Progress Notes (Signed)
ELECTROPHYSIOLOGY OFFICE NOTE  Patient ID: Dana Harvey, MRN: [id number], DOB/AGE: 60-Feb-1962 60 y.o. Admit date: (Not on file) Date of Consult: 06/26/2022  Primary Physician: [name], MD (Inactive) Primary Cardiologist: [facility]             HPI Dana Harvey is a 61 y.o. female seen in follow-up for pacemaker implanted elsewhere in 2015, history of inappropriate sinus tachycardia for which she underwent ablation and exercise intolerance..    Exertional fatigue.  Works at [employer].  Significant stress in general.   Interval evaluation in the ER 6/23 for abrupt mental status changes felt on evaluation to be consistent with psychogenic pseudoseizure.  Chest CT and head CT were normal  Today she arrived on time, her expectations about being the first patient were wrong, she was second.  I was delayed in [place].  It was an unfortunate series of events.  She has concerns about her pacemaker battery and what is the behavior at maker battery ERI,. Also complains of dyspnea and fatigue with exertion some lightheadedness with exertion  DATE TEST EF   8/20 .myoview  59 % No ischemia  8/20 Echo  50-55%   1/22 Echo         Date   Cr              K           Hgb   10/21 0.69 4.0      6/22  0.66 3.7            13.8   6/23 0.7 3.9 13.3    Past Medical History:  Diagnosis Date   Allergy    Anxiety    Chest pain at rest    normal cors on cath in 2012. Normal coronary CT in 2018. Normal stress test in 05/2019   CVA (cerebral vascular accident) (HCC) 1997   Depression    Diverticulitis    Dyspnea    previous PFT showed normal lung volumes, severe diffusion defect with DLCO 43% predicted   Fainting spell    Frequent headaches    Gastroparesis    GERD (gastroesophageal reflux disease)    Heart murmur    Insomnia    Insomnia    Migraines    Pacemaker    St Jude dual chamber PPM placed on 09/06/2014 by Dr. Gaynelle Adu in [place]   PAF  (paroxysmal atrial fibrillation) [facility])    diagnosed by Dr. Gaynelle Adu, remote history, no recurrent   Polyp of intestine 2018   benign per patient   Seizures (HCC)    secondary to potassium level dropping (due to opioid medications per patient)   Thyroid disease       Surgical History:  Past Surgical History:  Procedure Laterality Date   ABDOMINAL HYSTERECTOMY  1993   heavy vaginal bleeding; later went in and removed Right ovary   APPENDECTOMY  1984   ATRIAL ABLATION SURGERY  2012   BREAST EXCISIONAL BIOPSY     bilateral; benign   CARDIAC CATHETERIZATION  2012   normal cors on cath in [facility] tx   CESAREAN SECTION     [phone]   CHOLECYSTECTOMY  2010   ELECTROPHYSIOLOGY STUDY  around 2013   with ablation for clinically suspected inappropriate sinus tachycardia, by Dr. Annice Pih   LOOP RECORDER IMPLANT  2013   by Dr. Annice Pih   PACEMAKER INSERTION  09/06/2014   St Jude Assurity dual chamber PPM,  Dr Onalee Hua [place]--per patient; put in after ablation     Home Meds:   MAR reviewed after visit     Allergies:  Allergies  Allergen Reactions   Blueberry [Vaccinium Angustifolium] Anaphylaxis   Metoprolol Shortness Of Breath, Anxiety, Palpitations and Other (See Comments)     Hallucinations, low BP   Morphine Anaphylaxis    Cardiac arrest   Nystatin Anaphylaxis and Cough   Penicillins Anaphylaxis    Per Pt "lost hearing for a few days"   Aspirin Itching and Tinitus   Crestor [Rosuvastatin Calcium] Other (See Comments)    Pain in legs   Fleet Enema [Enema] Diarrhea    vomiting   Gluten Meal Diarrhea and Nausea And Vomiting   Midodrine Hcl Other (See Comments)    Tachycardia per patient Suicidal thoughts   Miralax [Polyethylene Glycol] Diarrhea    Vomiting, patient states this causes a-fib and leads to syncope   Motegrity [Prucalopride] Other (See Comments)    Suicidal thoughts, fatigue    Wheat Bran Other (See Comments)    Hiccups    Other Rash    Band-Aid             ROS:  Please see the history of present illness.     All other systems reviewed and negative.   Physical Exam: BP 122/72 Comment: patient refused  Pulse 70 Comment: patient refused  Ht 5\' 6"  (1.676 m)   Wt 123 lb (55.8 kg)   BMI 19.85 kg/m  Well developed and well nourished in no acute distress HENT normal Neck supple with JVP-flat Clear Device pocket well healed; without hematoma or erythema.  There is no tethering  Regular rate and rhythm, no  murmur Abd-soft with active BS No Clubbing cyanosis  edema Skin-warm and dry A & Oriented  Grossly normal sensory and motor function  ECG 04/25/2022 AV pacing at 70 Was 18/08/40  Assessment and Plan:   Inappropriate sinus tachy s/p ablation  Sinus node dysfunction secondary/chronotropic incompetence  Pacemaker St Jude   orthostatic intolerance  Headaches  Dyspnea on exertion  There was an unfortunate sequence of events outlined elsewhere.  Lengthy discussion regarding end-of-life battery behavior and relationship to monitoring frequency.  Reassured her that there are strong strong data that she has at least 3 months following ERI.  Her descriptions of exercise intolerance and that when she would walk her heart rate will get up to 90 she would feel lightheaded begged the issue as to whether the heart rate was too fast or too slow, heart rate excursion is significantly blunted  Stair walk was associated with exertional lightheadedness at the top of 3 flights of stairs heart rate was about 80.  Rate response was programmed on with an upper sensor rate of 130.  Repeat walk was associated with no fatigue and no lightheadedness.  When I called her at home a little bit ago to check her MAR, she states she was able to walk in the house without fatigue.  Felt 100% better.

## 2022-06-27 ENCOUNTER — Ambulatory Visit
Admission: RE | Admit: 2022-06-27 | Discharge: 2022-06-27 | Disposition: A | Discharge status: Home / Self Care | Payer: BC Managed Care – PPO | Source: Ambulatory Visit

## 2022-06-27 DIAGNOSIS — Z1231 Encounter for screening mammogram for malignant neoplasm of breast: Secondary | ICD-10-CM | POA: Diagnosis not present

## 2022-07-01 DIAGNOSIS — I480 Paroxysmal atrial fibrillation: Secondary | ICD-10-CM | POA: Diagnosis not present

## 2022-07-13 NOTE — Progress Notes (Signed)
Remote pacemaker transmission.   

## 2022-07-29 ENCOUNTER — Encounter: Payer: BC Managed Care – PPO | Admitting: Family Medicine

## 2022-08-28 DIAGNOSIS — R002 Palpitations: Secondary | ICD-10-CM | POA: Diagnosis not present

## 2022-08-29 ENCOUNTER — Encounter: Payer: Self-pay | Admitting: Internal Medicine

## 2022-08-29 ENCOUNTER — Encounter: Payer: Self-pay | Admitting: Cardiology

## 2022-08-29 NOTE — Telephone Encounter (Signed)
Spoke to patient she stated her B/P has been elevated ever since Dr.Klein made changes to her pacemaker.Advised she is past due to see Dr.Crenshaw.Appointment scheduled with Dr.Crenshaw 11/8 at 11:00 am.Advised to bring B/P readings to appointment.

## 2022-09-01 NOTE — Progress Notes (Unsigned)
HPI: Follow-up chest pain, hypertension and previous pacemaker.  Patient has had previous ablation for inappropriate sinus tachycardia.  Apparently had cardiac catheterization in 2012 in [place] which was normal.  Had pacemaker placed for sick sinus syndrome November 2015. Cardiac CT 2018 showed normal coronary arteries.  Nuclear study 8/20 showed ejection fraction 59% with no ischemia or infarction.  Has been seen by Dr. Graciela Husbands previously for orthostasis. Also with history of 34 seconds nonsustained ventricular tachycardia noted on interrogating her device. Echocardiogram January 2022 showed normal LV systolic and diastolic function.  Patient contacted the office recently and stated her blood pressure was running higher after her pacemaker was reprogrammed.  Since last seen since her pacemaker was recently adjusted she feels her heart beat at a higher rate.  This is particularly bothersome.  She has a sensation of dyspnea with this as well.  She denies orthopnea, PND, pedal edema, exertional chest pain or syncope.  She did have an episode of chest pain recently but no recurrences.  Current Outpatient Medications  Medication Sig Dispense Refill   ALPRAZolam (XANAX) 0.5 MG tablet TAKE 1 TABLET BY MOUTH TWICE A DAY AS NEEDED FOR ANXIETY (Patient taking differently: Take 0.25 mg by mouth in the morning, at noon, and at bedtime.) 60 tablet 2   ascorbic acid (VITAMIN C) 500 MG tablet Take 500 mg by mouth daily.     atorvastatin (LIPITOR) 80 MG tablet Take 1 tablet (80 mg total) by mouth daily. 90 tablet 1   cholecalciferol (VITAMIN D3) 25 MCG (1000 UNIT) tablet Take 1,000 Units by mouth at bedtime.     OVER THE COUNTER MEDICATION Take 1 tablet by mouth daily. CoQnol     pantoprazole (PROTONIX) 40 MG tablet Take 2 tablets (80 mg total) by mouth daily. (Patient taking differently: Take 80 mg by mouth daily as needed (acid reflux).) 180 tablet 1   scopolamine (TRANSDERM-SCOP) 1 MG/3DAYS Place 1 patch (1.5 mg  total) onto the skin every 3 (three) days. 10 patch 2   traZODone (DESYREL) 100 MG tablet Take 1 tablet (100 mg total) by mouth at bedtime. 30 tablet 3   VITAMIN E PO Take 1 tablet by mouth daily.     zolpidem (AMBIEN) 10 MG tablet TAKE 1 TABLET BY MOUTH EVERY DAY AT BEDTIME AS NEEDED FOR SLEEP Strength: 10 mg 30 tablet 2   No current facility-administered medications for this visit.     Past Medical History:  Diagnosis Date   Allergy    Anxiety    Chest pain at rest    normal cors on cath in 2012. Normal coronary CT in 2018. Normal stress test in 05/2019   CVA (cerebral vascular accident) (HCC) 1997   Depression    Diverticulitis    Dyspnea    previous PFT showed normal lung volumes, severe diffusion defect with DLCO 43% predicted   Fainting spell    Frequent headaches    Gastroparesis    GERD (gastroesophageal reflux disease)    Heart murmur    Insomnia    Insomnia    Migraines    Pacemaker    St Jude dual chamber PPM placed on 09/06/2014 by Dr. Gaynelle Adu in [place]   PAF (paroxysmal atrial fibrillation) [facility])    diagnosed by Dr. Gaynelle Adu, remote history, no recurrent   Polyp of intestine 2018   benign per patient   Seizures (HCC)    secondary to potassium level dropping (due to opioid medications per patient)  Thyroid disease     Past Surgical History:  Procedure Laterality Date   ABDOMINAL HYSTERECTOMY  1993   heavy vaginal bleeding; later went in and removed Right ovary   APPENDECTOMY  1984   ATRIAL ABLATION SURGERY  2012   BREAST EXCISIONAL BIOPSY     bilateral; benign   CARDIAC CATHETERIZATION  2012   normal cors on cath in [facility] [place]   CESAREAN SECTION     [phone]   CHOLECYSTECTOMY  2010   ELECTROPHYSIOLOGY STUDY  around 2013   with ablation for clinically suspected inappropriate sinus tachycardia, by Dr. Karleen Hampshire   LOOP RECORDER IMPLANT  2013   by Dr. Karleen Hampshire   PACEMAKER INSERTION  09/06/2014   St Jude Assurity dual chamber PPM, Dr Shanon Brow  [place]--per patient; put in after ablation    Social History   Socioeconomic History   Marital status: Married    Spouse name: Not on file   Number of children: 3   Years of education: Not on file   Highest education level: Not on file  Occupational History   Occupation: CMA    Comment: Energy manager  Tobacco Use   Smoking status: Never   Smokeless tobacco: Never   Tobacco comments:    Per patient previous info is not correct-resides with a smoker  Vaping Use   Vaping Use: Never used  Substance and Sexual Activity   Alcohol use: Never   Drug use: Never   Sexual activity: Not on file  Other Topics Concern   Not on file  Social History Narrative   Not on file   Social Determinants of Health   Financial Resource Strain: Unknown (06/11/2022)   Overall Financial Resource Strain (CARDIA)    Difficulty of Paying Living Expenses: Patient refused  Food Insecurity: Unknown (06/11/2022)   Hunger Vital Sign    Worried About Running Out of Food in the Last Year: Patient refused    [place] in the Last Year: Patient refused  Transportation Needs: Unknown (06/11/2022)   PRAPARE - Transportation    Lack of Transportation (Medical): Patient refused    Lack of Transportation (Non-Medical): Patient refused  Physical Activity: Insufficiently Active (06/11/2022)   Exercise Vital Sign    Days of Exercise per Week: 3 days    Minutes of Exercise per Session: 30 min  Stress: Stress Concern Present (06/11/2022)   [place]    Feeling of Stress : Rather much  Social Connections: Unknown (06/11/2022)   Social Connection and Isolation Panel [NHANES]    Frequency of Communication with Friends and Family: Patient refused    Frequency of Social Gatherings with Friends and Family: Patient refused    Attends Religious Services: Patient refused    Marine scientist or Organizations: Patient refused    Attends Programme researcher, broadcasting/film/video: Not on file    Marital Status: Married  Human resources officer Violence: Not on file    Family History  Problem Relation Age of Onset   Osteoarthritis Mother    Alcohol abuse Mother    Arthritis Mother    COPD Mother    Depression Mother    Hearing loss Mother    Hyperlipidemia Mother    Hypertension Mother    Cancer Father    Diabetes Father    Heart disease Father 41       CABG   Hypertension Father    Alcohol abuse Brother    Cancer  Brother    Depression Brother    Drug abuse Brother    Alcohol abuse Son    Drug abuse Son    Early death Maternal Grandmother 44   Heart disease Maternal Grandmother    Alcohol abuse Maternal Grandfather    Heart disease Maternal Grandfather 35   Cancer Paternal Grandmother    Heart disease Paternal Grandmother    Cancer Paternal Grandfather    Heart disease Paternal Grandfather    Alcohol abuse Brother    Depression Brother    Diabetes Brother    Drug abuse Brother    Hypertension Brother    Hyperlipidemia Brother     ROS: no fevers or chills, productive cough, hemoptysis, dysphasia, odynophagia, melena, hematochezia, dysuria, hematuria, rash, seizure activity, orthopnea, PND, pedal edema, claudication. Remaining systems are negative.  Physical Exam: Well-developed well-nourished in no acute distress.  Skin is warm and dry.  HEENT is normal.  Neck is supple.  Chest is clear to auscultation with normal expansion.  Cardiovascular exam is regular rate and rhythm.  Abdominal exam nontender or distended. No masses palpated. Extremities show no edema. neuro grossly intact  ECG- personally reviewed  A/P  1 history of presyncope-as outlined in previous notes patient had her device interrogated at that time which showed nonsustained ventricular tachycardia.  However her LV function is normal and she has had no recurrences.  Beta-blocker not added in the past due to history of orthostasis.  2 orthostatic  hypotension-we discussed importance of hydration and increase sodium intake.  3 pacemaker-managed by electrophysiology.  She is having some symptoms of heart pounding since her device was reprogrammed.  We will arrange follow-up with Dr. Graciela Husbands for further adjustments.  4 history of atypical chest pain-previous CTA showed no obstructive coronary disease.  Would not pursue further evaluation at this point.  5 status post ablation of inappropriate sinus tachycardia  Olga Millers, MD

## 2022-09-03 ENCOUNTER — Ambulatory Visit: Payer: BC Managed Care – PPO | Attending: Cardiology | Admitting: Cardiology

## 2022-09-03 ENCOUNTER — Encounter: Payer: Self-pay | Admitting: Cardiology

## 2022-09-03 VITALS — BP 100/68 | HR 69 | Ht 66.0 in | Wt 122.2 lb

## 2022-09-03 DIAGNOSIS — Z950 Presence of cardiac pacemaker: Secondary | ICD-10-CM | POA: Diagnosis not present

## 2022-09-03 DIAGNOSIS — I951 Orthostatic hypotension: Secondary | ICD-10-CM

## 2022-09-03 DIAGNOSIS — R002 Palpitations: Secondary | ICD-10-CM | POA: Diagnosis not present

## 2022-09-03 DIAGNOSIS — I495 Sick sinus syndrome: Secondary | ICD-10-CM

## 2022-09-03 NOTE — Patient Instructions (Signed)
  Follow-Up: At [facility], you and your health needs are our priority.  As part of our continuing mission to provide you with exceptional heart care, we have created designated Provider Care Teams.  These Care Teams include your primary Cardiologist (physician) and Advanced Practice Providers (APPs -  Physician Assistants and Nurse Practitioners) who all work together to provide you with the care you need, when you need it.  We recommend signing up for the patient portal called "MyChart".  Sign up information is provided on this After Visit Summary.  MyChart is used to connect with patients for Virtual Visits (Telemedicine).  Patients are able to view lab/test results, encounter notes, upcoming appointments, etc.  Non-urgent messages can be sent to your provider as well.   To learn more about what you can do with MyChart, go to [email or web address].    Your next appointment:   12 month(s)  The format for your next appointment:   In Person  Provider:   Brian Crenshaw, MD   

## 2022-09-05 ENCOUNTER — Telehealth: Payer: Self-pay | Admitting: *Deleted

## 2022-09-05 NOTE — Telephone Encounter (Signed)
Left message for patient to call next week and get an appointment with the EP assistance when dr Graciela Husbands is in the office to get the pacer readjusted.

## 2022-09-05 NOTE — Telephone Encounter (Signed)
-----   Message from Wiliam Ke, RN sent at 09/03/2022 11:36 AM EST ----- She probably needs to see Mardelle Matte or Luster Landsberg, or device clinic on a day that Dr. Graciela Husbands is also in office. Might be easier to do [place] or [place].  Not sure when he is back from his vacation. Boneta Lucks  ----- Message ----- From: Freddi Starr, RN Sent: 09/03/2022  11:26 AM EST To: [facility]  Good morning, Dr Graciela Husbands recently made adjustments in her device and now she is having problems. Do I need to get her back in with klein or who does she need to see?

## 2022-09-08 ENCOUNTER — Encounter: Payer: Self-pay | Admitting: Internal Medicine

## 2022-09-15 ENCOUNTER — Ambulatory Visit (INDEPENDENT_AMBULATORY_CARE_PROVIDER_SITE_OTHER): Payer: BC Managed Care – PPO

## 2022-09-15 DIAGNOSIS — I495 Sick sinus syndrome: Secondary | ICD-10-CM | POA: Diagnosis not present

## 2022-09-16 LAB — CUP PACEART REMOTE DEVICE CHECK
Battery Remaining Longevity: 30 mo
Battery Remaining Percentage: 27 %
Battery Voltage: 2.95 V
Brady Statistic AP VP Percent: 1 %
Brady Statistic AP VS Percent: 89 %
Brady Statistic AS VP Percent: 1 %
Brady Statistic AS VS Percent: 11 %
Brady Statistic RA Percent Paced: 86 %
Brady Statistic RV Percent Paced: 1 %
Date Time Interrogation Session: 20231120062720
Implantable Lead Connection Status: 753985
Implantable Lead Connection Status: 753985
Implantable Lead Implant Date: 20151111
Implantable Lead Implant Date: 20151111
Implantable Lead Location: 753859
Implantable Lead Location: 753860
Implantable Pulse Generator Implant Date: 20151111
Lead Channel Impedance Value: 400 Ohm
Lead Channel Impedance Value: 440 Ohm
Lead Channel Pacing Threshold Amplitude: 0.5 V
Lead Channel Pacing Threshold Amplitude: 0.75 V
Lead Channel Pacing Threshold Pulse Width: 0.4 ms
Lead Channel Pacing Threshold Pulse Width: 0.4 ms
Lead Channel Sensing Intrinsic Amplitude: 4.7 mV
Lead Channel Sensing Intrinsic Amplitude: 9.7 mV
Lead Channel Setting Pacing Amplitude: 2 V
Lead Channel Setting Pacing Amplitude: 2.5 V
Lead Channel Setting Pacing Pulse Width: 0.4 ms
Lead Channel Setting Sensing Sensitivity: 0.5 mV
Pulse Gen Model: 2240
Pulse Gen Serial Number: 7694052

## 2022-09-16 NOTE — Telephone Encounter (Signed)
Per my chart message, patient is feeling better and does not need to be seen at this time.

## 2022-09-22 DIAGNOSIS — I495 Sick sinus syndrome: Secondary | ICD-10-CM | POA: Diagnosis not present

## 2022-10-14 ENCOUNTER — Telehealth: Payer: Self-pay

## 2022-10-14 NOTE — Telephone Encounter (Signed)
Spoke with pt who reports symptoms have resolved since stopping caffeine.  She does not feel her device needs any adjustment at this time. Pt advised to call with any further questions or concerns.  Pt was very grateful for the call.

## 2022-10-14 NOTE — Telephone Encounter (Signed)
-----   Message from Duke Salvia, MD sent at 10/09/2022  6:20 PM EST ----- Plz followup with pt regarding symoptoms related to pacemaker   Deliah Goody 11/23 called and seemed like everything was doing fine Thanks SK

## 2022-10-22 ENCOUNTER — Encounter: Payer: Self-pay | Admitting: Internal Medicine

## 2022-10-22 ENCOUNTER — Telehealth: Payer: Self-pay | Admitting: Internal Medicine

## 2022-10-22 NOTE — Telephone Encounter (Signed)
Pt would like a callback from nurse regarding pacemaker check. Please advise

## 2022-10-23 ENCOUNTER — Encounter: Payer: Self-pay | Admitting: Internal Medicine

## 2022-10-23 NOTE — Telephone Encounter (Signed)
Patient sent a mychart message (10/22/22) Reporting: "I was driving home about 15 minutes ago and I felt my heart racing and my vision went dim for about 1-2 seconds." Message came in at 11:49am on 10/22/22.   The timing of this does correspond with one of the fast rate episodes on 12/27:  Episode only lasts 4 secs but apparently able to produce sx impact.  Will review with Dr. Graciela Husbands for further recommendations.      PRESENTING   EPISODE ON 12/17

## 2022-10-23 NOTE — Telephone Encounter (Signed)
Please refer to 10/22/22 phone encounter for full details and response.

## 2022-10-23 NOTE — Telephone Encounter (Signed)
ERRONEOUS ENCOUNTER

## 2022-10-23 NOTE — Telephone Encounter (Signed)
Please refer to Dr. Odessa Fleming documentation note today outlining his follow up with patient.   Discussed fleccanide v/s beta blocker to assist with rate control.  Patient is to check bp's x1 week and f/u with [place] by next Wednesday. Will decide at that time how to best proceed.

## 2022-10-23 NOTE — Telephone Encounter (Signed)
Reviewed transmission on 12/27 - normal device function.  There were 3 brief tachy events.  2 - A tach events max a rate of 190/ max V rate 152.  One event 12 secs, the second only 4secs. 1 - appeared SVT event 4 secs in duration. A - 167, V - 95  V- rates overall controlled  No afib/flutter noted.   LM returning patient's call.

## 2022-10-23 NOTE — Progress Notes (Signed)
Called and spoke to the patient.  Has had a couple of episodes of tachycardia on her watch 110s or so associated with lightheadedness; event in the car was somewhat dissimilar associate with a heart rate of 150 duration about 5 seconds.  All are associate with fatigue and are most consistent in my mind with triggered vaso motor sensitivity.  We discussed 2 potential strategies 1 with a calcium/beta-blocker to try to decrease the ventricular response and the otherwise flecainide to try to decrease the atrial ectopy.  Her blood pressures have run low in the past, we will have her measure her blood pressures and then make a decision 1 way or the other

## 2022-10-27 ENCOUNTER — Encounter: Payer: Self-pay | Admitting: Internal Medicine

## 2022-10-28 MED ORDER — DILTIAZEM HCL ER COATED BEADS 120 MG PO CP24: 120.0000 mg | ORAL_CAPSULE | Freq: Every day | ORAL | 3 refills | Status: DC

## 2022-10-28 NOTE — Progress Notes (Signed)
Remote pacemaker transmission.   

## 2022-11-06 DIAGNOSIS — Z23 Encounter for immunization: Secondary | ICD-10-CM | POA: Diagnosis not present

## 2022-11-07 ENCOUNTER — Other Ambulatory Visit: Payer: Self-pay | Admitting: Family

## 2022-11-17 DIAGNOSIS — K589 Irritable bowel syndrome without diarrhea: Secondary | ICD-10-CM | POA: Diagnosis not present

## 2022-11-17 DIAGNOSIS — K5903 Drug induced constipation: Secondary | ICD-10-CM | POA: Diagnosis not present

## 2022-11-17 DIAGNOSIS — K3184 Gastroparesis: Secondary | ICD-10-CM | POA: Diagnosis not present

## 2022-11-17 NOTE — Telephone Encounter (Signed)
Called and spoke with her about stopping dilt and will try flecainide once she is back to normal

## 2022-11-19 NOTE — Telephone Encounter (Signed)
50 bid

## 2022-11-20 MED ORDER — FLECAINIDE ACETATE 50 MG PO TABS: 50.0000 mg | ORAL_TABLET | Freq: Two times a day (BID) | ORAL | 1 refills | Status: DC

## 2022-11-20 NOTE — Addendum Note (Signed)
Addended by: Thora Lance on: 11/20/2022 10:28 AM   Modules accepted: Orders

## 2022-11-20 NOTE — Addendum Note (Signed)
Addended by: Thora Lance on: 11/20/2022 01:15 PM   Modules accepted: Orders

## 2022-12-15 ENCOUNTER — Ambulatory Visit: Payer: BC Managed Care – PPO

## 2022-12-15 DIAGNOSIS — I495 Sick sinus syndrome: Secondary | ICD-10-CM

## 2022-12-15 LAB — CUP PACEART REMOTE DEVICE CHECK
Battery Remaining Longevity: 27 mo
Battery Remaining Percentage: 25 %
Battery Voltage: 2.93 V
Brady Statistic AP VP Percent: 1 %
Brady Statistic AP VS Percent: 91 %
Brady Statistic AS VP Percent: 1 %
Brady Statistic AS VS Percent: 8.9 %
Brady Statistic RA Percent Paced: 88 %
Brady Statistic RV Percent Paced: 1 %
Date Time Interrogation Session: 20240219020015
Implantable Lead Connection Status: 753985
Implantable Lead Connection Status: 753985
Implantable Lead Implant Date: 20151111
Implantable Lead Implant Date: 20151111
Implantable Lead Location: 753859
Implantable Lead Location: 753860
Implantable Pulse Generator Implant Date: 20151111
Lead Channel Impedance Value: 360 Ohm
Lead Channel Impedance Value: 450 Ohm
Lead Channel Pacing Threshold Amplitude: 0.5 V
Lead Channel Pacing Threshold Amplitude: 0.75 V
Lead Channel Pacing Threshold Pulse Width: 0.4 ms
Lead Channel Pacing Threshold Pulse Width: 0.4 ms
Lead Channel Sensing Intrinsic Amplitude: 12 mV
Lead Channel Sensing Intrinsic Amplitude: 4.4 mV
Lead Channel Setting Pacing Amplitude: 2 V
Lead Channel Setting Pacing Amplitude: 2.5 V
Lead Channel Setting Pacing Pulse Width: 0.4 ms
Lead Channel Setting Sensing Sensitivity: 0.5 mV
Pulse Gen Model: 2240
Pulse Gen Serial Number: 7694052

## 2022-12-25 DIAGNOSIS — E039 Hypothyroidism, unspecified: Secondary | ICD-10-CM | POA: Diagnosis not present

## 2022-12-25 DIAGNOSIS — G9332 Myalgic encephalomyelitis/chronic fatigue syndrome: Secondary | ICD-10-CM | POA: Diagnosis not present

## 2022-12-25 DIAGNOSIS — I480 Paroxysmal atrial fibrillation: Secondary | ICD-10-CM | POA: Diagnosis not present

## 2022-12-25 DIAGNOSIS — E785 Hyperlipidemia, unspecified: Secondary | ICD-10-CM | POA: Diagnosis not present

## 2022-12-25 DIAGNOSIS — E559 Vitamin D deficiency, unspecified: Secondary | ICD-10-CM | POA: Diagnosis not present

## 2023-01-01 DIAGNOSIS — Z0000 Encounter for general adult medical examination without abnormal findings: Secondary | ICD-10-CM | POA: Diagnosis not present

## 2023-01-19 DIAGNOSIS — I480 Paroxysmal atrial fibrillation: Secondary | ICD-10-CM | POA: Diagnosis not present

## 2023-01-19 DIAGNOSIS — M5416 Radiculopathy, lumbar region: Secondary | ICD-10-CM | POA: Diagnosis not present

## 2023-01-26 NOTE — Progress Notes (Signed)
Remote pacemaker transmission.   

## 2023-03-06 DIAGNOSIS — M25522 Pain in left elbow: Secondary | ICD-10-CM | POA: Diagnosis not present

## 2023-03-06 DIAGNOSIS — S40022A Contusion of left upper arm, initial encounter: Secondary | ICD-10-CM | POA: Diagnosis not present

## 2023-03-06 DIAGNOSIS — W010XXA Fall on same level from slipping, tripping and stumbling without subsequent striking against object, initial encounter: Secondary | ICD-10-CM | POA: Diagnosis not present

## 2023-03-16 ENCOUNTER — Ambulatory Visit (INDEPENDENT_AMBULATORY_CARE_PROVIDER_SITE_OTHER): Payer: BC Managed Care – PPO

## 2023-03-16 DIAGNOSIS — I495 Sick sinus syndrome: Secondary | ICD-10-CM

## 2023-03-17 LAB — CUP PACEART REMOTE DEVICE CHECK
Battery Remaining Longevity: 24 mo
Battery Remaining Percentage: 22 %
Battery Voltage: 2.93 V
Brady Statistic AP VP Percent: 1 %
Brady Statistic AP VS Percent: 92 %
Brady Statistic AS VP Percent: 1 %
Brady Statistic AS VS Percent: 8.3 %
Brady Statistic RA Percent Paced: 89 %
Brady Statistic RV Percent Paced: 1 %
Date Time Interrogation Session: 20240520031320
Implantable Lead Connection Status: 753985
Implantable Lead Connection Status: 753985
Implantable Lead Implant Date: 20151111
Implantable Lead Implant Date: 20151111
Implantable Lead Location: 753859
Implantable Lead Location: 753860
Implantable Pulse Generator Implant Date: 20151111
Lead Channel Impedance Value: 330 Ohm
Lead Channel Impedance Value: 460 Ohm
Lead Channel Pacing Threshold Amplitude: 0.5 V
Lead Channel Pacing Threshold Amplitude: 0.75 V
Lead Channel Pacing Threshold Pulse Width: 0.4 ms
Lead Channel Pacing Threshold Pulse Width: 0.4 ms
Lead Channel Sensing Intrinsic Amplitude: 11.1 mV
Lead Channel Sensing Intrinsic Amplitude: 5 mV
Lead Channel Setting Pacing Amplitude: 2 V
Lead Channel Setting Pacing Amplitude: 2.5 V
Lead Channel Setting Pacing Pulse Width: 0.4 ms
Lead Channel Setting Sensing Sensitivity: 0.5 mV
Pulse Gen Model: 2240
Pulse Gen Serial Number: 7694052

## 2023-04-10 NOTE — Progress Notes (Signed)
Remote pacemaker transmission.   

## 2023-04-22 DIAGNOSIS — H52203 Unspecified astigmatism, bilateral: Secondary | ICD-10-CM | POA: Diagnosis not present

## 2023-04-22 DIAGNOSIS — H2513 Age-related nuclear cataract, bilateral: Secondary | ICD-10-CM | POA: Diagnosis not present

## 2023-05-21 ENCOUNTER — Other Ambulatory Visit: Payer: Self-pay | Admitting: Internal Medicine

## 2023-06-03 ENCOUNTER — Other Ambulatory Visit: Payer: Self-pay | Admitting: Internal Medicine

## 2023-06-03 DIAGNOSIS — Z1231 Encounter for screening mammogram for malignant neoplasm of breast: Secondary | ICD-10-CM

## 2023-06-15 ENCOUNTER — Ambulatory Visit (INDEPENDENT_AMBULATORY_CARE_PROVIDER_SITE_OTHER): Payer: BC Managed Care – PPO

## 2023-06-15 DIAGNOSIS — I495 Sick sinus syndrome: Secondary | ICD-10-CM

## 2023-06-15 LAB — CUP PACEART REMOTE DEVICE CHECK
Battery Remaining Longevity: 22 mo
Battery Remaining Percentage: 20 %
Battery Voltage: 2.92 V
Brady Statistic AP VP Percent: 1 %
Brady Statistic AP VS Percent: 91 %
Brady Statistic AS VP Percent: 1 %
Brady Statistic AS VS Percent: 8.4 %
Brady Statistic RA Percent Paced: 89 %
Brady Statistic RV Percent Paced: 1 %
Date Time Interrogation Session: 20240819020012
Implantable Lead Connection Status: 753985
Implantable Lead Connection Status: 753985
Implantable Lead Implant Date: 20151111
Implantable Lead Implant Date: 20151111
Implantable Lead Location: 753859
Implantable Lead Location: 753860
Implantable Pulse Generator Implant Date: 20151111
Lead Channel Impedance Value: 360 Ohm
Lead Channel Impedance Value: 450 Ohm
Lead Channel Pacing Threshold Amplitude: 0.5 V
Lead Channel Pacing Threshold Amplitude: 0.75 V
Lead Channel Pacing Threshold Pulse Width: 0.4 ms
Lead Channel Pacing Threshold Pulse Width: 0.4 ms
Lead Channel Sensing Intrinsic Amplitude: 12 mV
Lead Channel Sensing Intrinsic Amplitude: 4 mV
Lead Channel Setting Pacing Amplitude: 2 V
Lead Channel Setting Pacing Amplitude: 2.5 V
Lead Channel Setting Pacing Pulse Width: 0.4 ms
Lead Channel Setting Sensing Sensitivity: 0.5 mV
Pulse Gen Model: 2240
Pulse Gen Serial Number: 7694052

## 2023-06-25 NOTE — Progress Notes (Signed)
Remote pacemaker transmission.   

## 2023-06-30 ENCOUNTER — Other Ambulatory Visit: Payer: Self-pay | Admitting: Internal Medicine

## 2023-06-30 DIAGNOSIS — Z1231 Encounter for screening mammogram for malignant neoplasm of breast: Secondary | ICD-10-CM

## 2023-07-02 DIAGNOSIS — Z1231 Encounter for screening mammogram for malignant neoplasm of breast: Secondary | ICD-10-CM

## 2023-07-09 ENCOUNTER — Ambulatory Visit
Admission: RE | Admit: 2023-07-09 | Discharge: 2023-07-09 | Disposition: A | Discharge status: Home / Self Care | Payer: BC Managed Care – PPO | Source: Ambulatory Visit

## 2023-07-09 DIAGNOSIS — Z1231 Encounter for screening mammogram for malignant neoplasm of breast: Secondary | ICD-10-CM

## 2023-07-09 DIAGNOSIS — G4700 Insomnia, unspecified: Secondary | ICD-10-CM | POA: Diagnosis not present

## 2023-07-22 ENCOUNTER — Telehealth: Payer: Self-pay | Admitting: Nurse Practitioner

## 2023-07-22 NOTE — Telephone Encounter (Signed)
Called patient to inform of diet due to the gastroparesis. Informed that she will be mailed the booklet today. Appt scheduled for 10/15/23 with Midge Minium. Patient understood and agreed.

## 2023-07-22 NOTE — Telephone Encounter (Signed)
Inbound call from patient requesting an appointment with paula. Patient stated she is having issues with gastroparesis. Patient is not digesting her food. She had a little bit of a steak on Sunday and vomited it back up yesterday. Patient was offered next available 12/18. She stated she is miserable and can not wait that long and requested to speak with nurse after 1:00 today. Please advise.

## 2023-07-30 ENCOUNTER — Encounter: Payer: Self-pay | Admitting: Physical Medicine and Rehabilitation

## 2023-07-30 ENCOUNTER — Other Ambulatory Visit: Payer: Self-pay | Admitting: Physical Medicine and Rehabilitation

## 2023-07-30 DIAGNOSIS — M545 Low back pain, unspecified: Secondary | ICD-10-CM

## 2023-07-30 DIAGNOSIS — M5416 Radiculopathy, lumbar region: Secondary | ICD-10-CM | POA: Diagnosis not present

## 2023-07-31 DIAGNOSIS — R600 Localized edema: Secondary | ICD-10-CM | POA: Diagnosis not present

## 2023-07-31 DIAGNOSIS — R5383 Other fatigue: Secondary | ICD-10-CM | POA: Diagnosis not present

## 2023-07-31 DIAGNOSIS — M5442 Lumbago with sciatica, left side: Secondary | ICD-10-CM | POA: Diagnosis not present

## 2023-08-13 ENCOUNTER — Ambulatory Visit
Admission: RE | Admit: 2023-08-13 | Discharge: 2023-08-13 | Disposition: A | Discharge status: Home / Self Care | Payer: BC Managed Care – PPO | Source: Ambulatory Visit | Attending: Physical Medicine and Rehabilitation | Admitting: Physical Medicine and Rehabilitation

## 2023-08-13 DIAGNOSIS — M48061 Spinal stenosis, lumbar region without neurogenic claudication: Secondary | ICD-10-CM | POA: Diagnosis not present

## 2023-08-13 DIAGNOSIS — M5126 Other intervertebral disc displacement, lumbar region: Secondary | ICD-10-CM | POA: Diagnosis not present

## 2023-08-13 DIAGNOSIS — M545 Low back pain, unspecified: Secondary | ICD-10-CM

## 2023-08-13 DIAGNOSIS — M47816 Spondylosis without myelopathy or radiculopathy, lumbar region: Secondary | ICD-10-CM | POA: Diagnosis not present

## 2023-08-18 ENCOUNTER — Encounter (HOSPITAL_BASED_OUTPATIENT_CLINIC_OR_DEPARTMENT_OTHER): Payer: Self-pay | Admitting: Family

## 2023-08-18 ENCOUNTER — Telehealth: Payer: Self-pay | Admitting: Cardiology

## 2023-08-18 ENCOUNTER — Ambulatory Visit (HOSPITAL_BASED_OUTPATIENT_CLINIC_OR_DEPARTMENT_OTHER): Payer: BC Managed Care – PPO | Admitting: Family

## 2023-08-18 VITALS — BP 120/66 | HR 70 | Resp 16 | Ht 66.0 in | Wt 129.7 lb

## 2023-08-18 DIAGNOSIS — R072 Precordial pain: Secondary | ICD-10-CM | POA: Diagnosis not present

## 2023-08-18 DIAGNOSIS — Z950 Presence of cardiac pacemaker: Secondary | ICD-10-CM | POA: Diagnosis not present

## 2023-08-18 DIAGNOSIS — R600 Localized edema: Secondary | ICD-10-CM

## 2023-08-18 DIAGNOSIS — I495 Sick sinus syndrome: Secondary | ICD-10-CM

## 2023-08-18 DIAGNOSIS — I951 Orthostatic hypotension: Secondary | ICD-10-CM

## 2023-08-18 DIAGNOSIS — R0609 Other forms of dyspnea: Secondary | ICD-10-CM

## 2023-08-18 DIAGNOSIS — I4711 Inappropriate sinus tachycardia, so stated: Secondary | ICD-10-CM

## 2023-08-18 NOTE — Progress Notes (Signed)
Cardiology Office Note:  .   Date:  08/18/2023  ID:  Dana Harvey, DOB [birth year], MRN [id number] PCP: Creola Corn, MD  [facility] Providers Cardiologist:  Olga Millers, MD Electrophysiologist:  Sherryl Manges, MD    History of Present Illness: Dana Harvey   Dana Harvey is a 62 y.o. female with history of chest pain, hypertension, PPM, aortic atherosclerosis. Prior ablation for inappropriate sinus tachycardia.   Per prior notes, LHC 2012 in [place] normal. November 2015 PPM plced for SSS. Prior myoview 2017 unremarkable. Myoview 05/2019 low risk with no ischemia nor infarction. Echo 05/2019 normal LVEF 50-55%, no significant valvular abnormalities.   Echo 10/2020 normal systolic and diastolic function. CT angio chest/abd/pelvis 03/2022 with no reported coronary calcification but did not aortic atherosclerosis.   Last seen 09/03/22 by Dr. Jens Som with chief complaint of BP running higher since PPm reprogrammed. HR on PPM had been adjusted higher.   Scheduled for same day appointment due to chest pain. Presents today for follow up independently. Notes her mother passed away 2024/08/05from cancer and has had difficulties with grief.  Has a myriad of concerns detailed below:  March 2024 fell of wooden beam with subsequent back pain being worked up by Dr. Maurice Small with CT lumbar spine last week not yet resulted Onset April 2024 increased exertional dyspnea Onset October 2024 weight gain 14 lbs over 1 week with worsened dyspnea. Also notes bilateral LE edema,  legs feel "frozen inside" and her toes are "purple black". She is wearing ankle high compression socks with some improvement. Did not tolerate knee high compression socks. Does note "spider veins" on her feet/ankles.  Ongoing: lightheadedness with standing and position changes. No near syncope, syncope. Heart racing up to 120 bpm. Within last 2 weeks:  left chest discomfort described as "squeeze and let go".   Labs via [facility]  07/31/23 creatinine 0.64, GFR 100, K 4.3, Na 139, Hb 12.7, Hct 39.1, Plt 231  ROS:  Review of Systems  Constitutional: Positive for malaise/fatigue. Negative for chills and fever.  Cardiovascular:  Positive for chest pain, dyspnea on exertion, irregular heartbeat, leg swelling and palpitations. Negative for near-syncope, orthopnea and syncope.  Respiratory:  Positive for shortness of breath. Negative for cough and wheezing.   Gastrointestinal:  Negative for melena, nausea and vomiting.  Genitourinary:  Negative for hematuria.  Neurological:  Positive for dizziness and light-headedness. Negative for weakness.     Studies Reviewed: .        Cardiac Studies & Procedures     STRESS TESTS  MYOCARDIAL PERFUSION IMAGING 06/10/2019  Narrative  The left ventricular ejection fraction is normal (55-65%).  Nuclear stress EF: 59%.  There was no ST segment deviation noted during stress.  The study is normal.  This is a low risk study.  Normal resting and stress perfusion. No ischemia or infarction EF 59%   ECHOCARDIOGRAM  PCV ECHOCARDIOGRAM COMPLETE 11/21/2020  Narrative Echocardiogram 11/21/2020: Left ventricle cavity is normal in size and wall thickness. Normal global wall motion. Normal LV systolic function with EF 65%. Normal diastolic filling pattern. No significant valvular abnormality. Normal right atrial pressure.             Risk Assessment/Calculations:             Physical Exam:   VS:  BP 120/66   Pulse 70   Resp 16   Ht 5\' 6"  (1.676 m)   Wt 129 lb 11.2 oz (58.8 kg)   SpO2 97%  BMI 20.93 kg/m    Wt Readings from Last 3 Encounters:  08/18/23 129 lb 11.2 oz (58.8 kg)  09/03/22 122 lb 3.2 oz (55.4 kg)  06/26/22 123 lb (55.8 kg)    GEN: Well nourished, tearful, well developed in no acute distress NECK: No JVD; No carotid bruits CARDIAC: RRR, no murmurs, rubs, gallops RESPIRATORY:  Clear to auscultation without rales, wheezing or rhonchi  ABDOMEN: Soft,  non-tender, non-distended EXTREMITIES:  Trace pretibial edema; No deformity   ASSESSMENT AND PLAN: .    Chest pain / Exertional dyspnea / LE edema - Exertional dyspnea onset 01/2023 with progression starting 07/2023. Left sided chest 'squeeze' at rest or with activity. Bilateral LE edema with some improvement with Lasix 20mg  daily as started by PCP. Plan for echocardiogram as in setting of recent grief regarding loss of her mother 04/2024 concern for Takutsubo cardiomyopathy. BMP/BNP today to assess electrolytes and volume status on Lasix. Consider cardiac CTA at follow up if LVEF reduced or if chest pain persists. EKG today no acute ST/T wave changes. Suspect chest pain is related to volume overload or stress as nonexertional making it atypical for angina.   History of presyncope - device interrogation at time nonsustined VT. 10/2020 normal LVEF. BB not previously added due to hx of orthostasis. Encouraged to hold Lasix if SBP <110.   Orthostatic hypotension - Not presently on antihypertensive. BP controlled in clinic today. Recommend hydrating well, making position changes slowly, compression stockings.   IST s/p ablation / SSS s/p PPM - On Flecainide. Follows with Dr. Graciela Husbands. Due for in-office device check, will reach out to EP scheduling team.   Grief - Condolences offered as she lost her mother in July. Significant difficulties with grief, encouraged participation in grief group which she has been told is available to her.        Dispo: follow up next available with EP team. follow up in 2-3 months with Dr. Jens Som or APP  Signed, Alver Sorrow, NP

## 2023-08-18 NOTE — Telephone Encounter (Signed)
          New Message:Dr Timothy Lasso wants to talk to the doctor abouth this pt that needs to be seen today.

## 2023-08-18 NOTE — Patient Instructions (Addendum)
Medication Instructions:  Your physician recommends that you continue on your current medications as directed. Please refer to the Current Medication list given to you today.  *If you need a refill on your cardiac medications before your next appointment, please call your pharmacy*   Lab Work: BNP/BMP  If you have labs (blood work) drawn today and your tests are completely normal, you will receive your results only by: MyChart Message (if you have MyChart) OR A paper copy in the mail If you have any lab test that is abnormal or we need to change your treatment, we will call you to review the results.   Testing/Procedures: Your physician has requested that you have an echocardiogram. Echocardiography is a painless test that uses sound waves to create images of your heart. It provides your doctor with information about the size and shape of your heart and how well your heart's chambers and valves are working. This procedure takes approximately one hour. There are no restrictions for this procedure. Please do NOT wear cologne, perfume, aftershave, or lotions (deodorant is allowed). Please arrive 15 minutes prior to your appointment time.  Follow-Up: At [facility], you and your health needs are our priority.  As part of our continuing mission to provide you with exceptional heart care, we have created designated Provider Care Teams.  These Care Teams include your primary Cardiologist (physician) and Advanced Practice Providers (APPs -  Physician Assistants and Nurse Practitioners) who all work together to provide you with the care you need, when you need it.  We recommend signing up for the patient portal called "MyChart".  Sign up information is provided on this After Visit Summary.  MyChart is used to connect with patients for Virtual Visits (Telemedicine).  Patients are able to view lab/test results, encounter notes, upcoming appointments, etc.  Non-urgent messages can be sent to  your provider as well.   To learn more about what you can do with MyChart, go to [email or web address].    Your next appointment:   Follow up with Dr. Graciela Husbands (next available date for device check up) Follow up with Dr. Jens Som in 2 months.

## 2023-08-18 NOTE — Telephone Encounter (Signed)
Called Dr Ferd Hibbs office concerning patient. Advised person answering phone that I was returning a call from patient's cardiologist office. I was transferred to his assistant's phone. Left a message to return my call. Morrie Sheldon did return my call back and report that Dr Timothy Lasso has already spoken with someone and is awaiting a return call back with an available appointment.

## 2023-08-19 DIAGNOSIS — R072 Precordial pain: Secondary | ICD-10-CM | POA: Diagnosis not present

## 2023-08-20 ENCOUNTER — Other Ambulatory Visit: Payer: Self-pay | Admitting: Internal Medicine

## 2023-08-21 ENCOUNTER — Telehealth (HOSPITAL_BASED_OUTPATIENT_CLINIC_OR_DEPARTMENT_OTHER): Payer: Self-pay

## 2023-08-21 NOTE — Telephone Encounter (Signed)
-----   Message from Alver Sorrow sent at 08/21/2023  4:18 PM EDT ----- Stable kidney function. No volume overload. Good result! Continue plan as discussed in clinic visit.   Can just route to her in MyChart message ----- Message ----- From: Marlene Lard, RN Sent: 08/21/2023   8:36 AM EDT To: Alver Sorrow, NP  Labs for review

## 2023-08-27 DIAGNOSIS — M5416 Radiculopathy, lumbar region: Secondary | ICD-10-CM | POA: Diagnosis not present

## 2023-09-10 ENCOUNTER — Ambulatory Visit (INDEPENDENT_AMBULATORY_CARE_PROVIDER_SITE_OTHER): Payer: BC Managed Care – PPO

## 2023-09-10 DIAGNOSIS — R072 Precordial pain: Secondary | ICD-10-CM | POA: Diagnosis not present

## 2023-09-10 LAB — ECHOCARDIOGRAM COMPLETE
Area-P 1/2: 4.06 cm2
S' Lateral: 2.7 cm

## 2023-09-12 ENCOUNTER — Other Ambulatory Visit: Payer: Self-pay | Admitting: Internal Medicine

## 2023-09-14 ENCOUNTER — Ambulatory Visit (INDEPENDENT_AMBULATORY_CARE_PROVIDER_SITE_OTHER): Payer: BC Managed Care – PPO

## 2023-09-14 DIAGNOSIS — I495 Sick sinus syndrome: Secondary | ICD-10-CM

## 2023-09-15 LAB — CUP PACEART REMOTE DEVICE CHECK
Battery Remaining Longevity: 19 mo
Battery Remaining Percentage: 18 %
Battery Voltage: 2.9 V
Brady Statistic AP VP Percent: 1 %
Brady Statistic AP VS Percent: 91 %
Brady Statistic AS VP Percent: 1 %
Brady Statistic AS VS Percent: 8.5 %
Brady Statistic RA Percent Paced: 88 %
Brady Statistic RV Percent Paced: 1 %
Date Time Interrogation Session: 20241118020013
Implantable Lead Connection Status: 753985
Implantable Lead Connection Status: 753985
Implantable Lead Implant Date: 20151111
Implantable Lead Implant Date: 20151111
Implantable Lead Location: 753859
Implantable Lead Location: 753860
Implantable Pulse Generator Implant Date: 20151111
Lead Channel Impedance Value: 330 Ohm
Lead Channel Impedance Value: 480 Ohm
Lead Channel Pacing Threshold Amplitude: 0.5 V
Lead Channel Pacing Threshold Amplitude: 0.75 V
Lead Channel Pacing Threshold Pulse Width: 0.4 ms
Lead Channel Pacing Threshold Pulse Width: 0.4 ms
Lead Channel Sensing Intrinsic Amplitude: 11.4 mV
Lead Channel Sensing Intrinsic Amplitude: 4.2 mV
Lead Channel Setting Pacing Amplitude: 2 V
Lead Channel Setting Pacing Amplitude: 2.5 V
Lead Channel Setting Pacing Pulse Width: 0.4 ms
Lead Channel Setting Sensing Sensitivity: 0.5 mV
Pulse Gen Model: 2240
Pulse Gen Serial Number: 7694052

## 2023-09-17 ENCOUNTER — Ambulatory Visit: Payer: BC Managed Care – PPO | Admitting: Internal Medicine

## 2023-09-17 DIAGNOSIS — M5416 Radiculopathy, lumbar region: Secondary | ICD-10-CM | POA: Diagnosis not present

## 2023-09-22 ENCOUNTER — Encounter: Payer: Self-pay | Admitting: Internal Medicine

## 2023-09-22 ENCOUNTER — Ambulatory Visit: Payer: BC Managed Care – PPO | Attending: Internal Medicine | Admitting: Internal Medicine

## 2023-09-22 VITALS — BP 116/82 | HR 70 | Ht 66.0 in | Wt 130.2 lb

## 2023-09-22 DIAGNOSIS — I495 Sick sinus syndrome: Secondary | ICD-10-CM

## 2023-09-22 DIAGNOSIS — Z950 Presence of cardiac pacemaker: Secondary | ICD-10-CM

## 2023-09-22 DIAGNOSIS — I951 Orthostatic hypotension: Secondary | ICD-10-CM

## 2023-09-22 NOTE — Progress Notes (Signed)
ELECTROPHYSIOLOGY OFFICE NOTE  Patient ID: Dana Harvey, MRN: [id number], DOB/AGE: 02-17-1961 62 y.o. Admit date: (Not on file) Date of Consult: 09/22/2023  Primary Physician: Creola Corn, MD Primary Cardiologist: [facility]             HPI Dana Harvey is a 62 y.o. female seen in follow-up for pacemaker implanted elsewhere in 2015, history of inappropriate sinus tachycardia for which she underwent ablation and exercise intolerance..  Exertional fatigue.  Works at [employer].  Significant stress in general.   Interval evaluation in the ER 6/23 for abrupt mental status changes felt on evaluation to be consistent with psychogenic pseudoseizure.  Chest CT and head CT were normal  She comes in today, she declined ECG having had 1 recently she declined orthostatics but she declined device interrogation.  I utilized her remote.  She tells me that her mother died 06-13-2023.  She had had MDS and had had acute leukemia from January.  The patient had opportunity to spend a lot of time with her mother in the last couple of months but is still grieving and life is still hard. DATE TEST EF   8/20 .myoview  59 % No ischemia  8/20 Echo  50-55%   1/22 Echo         Date   Cr              K           Hgb   10/21 0.69 4.0      6/22  0.66 3.7            13.8   6/23 0.7 3.9 13.3    Past Medical History:  Diagnosis Date   Allergy    Anxiety    Chest pain at rest    normal cors on cath in 2012. Normal coronary CT in 2018. Normal stress test in 05/2019   CVA (cerebral vascular accident) (HCC) 1997   Depression    Diverticulitis    Dyspnea    previous PFT showed normal lung volumes, severe diffusion defect with DLCO 43% predicted   Fainting spell    Frequent headaches    Gastroparesis    GERD (gastroesophageal reflux disease)    Heart murmur    Insomnia    Insomnia    Migraines    Pacemaker    St Jude dual chamber PPM placed on 09/06/2014 by Dr. Gaynelle Adu in [place]   PAF (paroxysmal atrial fibrillation) [facility])    diagnosed by Dr. Gaynelle Adu, remote history, no recurrent   Polyp of intestine 2018   benign per patient   Seizures (HCC)    secondary to potassium level dropping (due to opioid medications per patient)   Thyroid disease       Surgical History:  Past Surgical History:  Procedure Laterality Date   ABDOMINAL HYSTERECTOMY  1993   heavy vaginal bleeding; later went in and removed Right ovary   APPENDECTOMY  1984   ATRIAL ABLATION SURGERY  2012   BREAST EXCISIONAL BIOPSY     bilateral; benign   CARDIAC CATHETERIZATION  2012   normal cors on cath in [facility] [place]   CESAREAN SECTION     [phone]   CHOLECYSTECTOMY  2010   ELECTROPHYSIOLOGY STUDY  around 2013   with ablation for clinically suspected inappropriate sinus tachycardia, by Dr. Annice Pih   LOOP RECORDER IMPLANT  2013   by Dr. Annice Pih   PACEMAKER INSERTION  09/06/2014   St Jude Assurity dual chamber PPM, Dr Onalee Hua [place]--per patient; put in after ablation     Home Meds:   MAR reviewed after visit     Allergies:  Allergies  Allergen Reactions   Blueberry [Vaccinium Angustifolium] Anaphylaxis   Metoprolol Shortness Of Breath, Anxiety, Palpitations and Other (See Comments)     Hallucinations, low BP   Morphine Anaphylaxis    Cardiac arrest   Nystatin Anaphylaxis and Cough   Penicillins Anaphylaxis    Per Pt "lost hearing for a few days"   Aspirin Itching and Tinitus   Crestor [Rosuvastatin Calcium] Other (See Comments)    Pain in legs   Fleet Enema [Enema] Diarrhea    vomiting   Gluten Meal Diarrhea and Nausea And Vomiting   Midodrine Hcl Other (See Comments)    Tachycardia per patient Suicidal thoughts   Miralax [Polyethylene Glycol] Diarrhea    Vomiting, patient states this causes a-fib and leads to syncope   Motegrity [Prucalopride] Other (See Comments)    Suicidal thoughts, fatigue    Wheat Other (See Comments)    Hiccups    Other Rash     Band-Aid            ROS:  Please see the history of present illness.     All other systems reviewed and negative.   Physical Exam: BP 116/82   Pulse 70   Ht 5\' 6"  (1.676 m)   Wt 130 lb 3.2 oz (59.1 kg)   SpO2 99%   BMI 21.01 kg/m  Well developed and well nourished in no acute distress HENT normal Neck supple   Device pocket well healed; without hematoma or erythema.  There is no tethering  Regular rate and rhythm, no  murmur No Clubbing cyanosis    Skin-warm and dry A & Oriented  Grossly normal sensory and motor function  ECG 08/18/2023 atrial pacing at 70 Interval 17/08/39     Device function is normal.  Based on remote device interrogation 11/24 She has essentially no heartbeats over 100 bpm  Programming changes   See Paceart for details    Assessment and Plan:   Inappropriate sinus tachy s/p ablation  Sinus node dysfunction secondary/chronotropic incompetence  Pacemaker St Jude   orthostatic intolerance  Headaches  Dyspnea on exertion  Grief  Still with orthostatic intolerance.  Suggested she try compression pants to include her thighs.  Mostly she has come to accommodate  Lengthy discussion regarding the loss of her mom  Exercise tolerance is reasonable so the heart rate excursion seems not to be limiting

## 2023-09-22 NOTE — Patient Instructions (Signed)

## 2023-10-07 NOTE — Progress Notes (Signed)
Remote pacemaker transmission.   

## 2023-10-13 DIAGNOSIS — M5416 Radiculopathy, lumbar region: Secondary | ICD-10-CM | POA: Diagnosis not present

## 2023-10-15 ENCOUNTER — Ambulatory Visit: Payer: BC Managed Care – PPO | Admitting: Nurse Practitioner

## 2023-11-04 DIAGNOSIS — R058 Other specified cough: Secondary | ICD-10-CM | POA: Diagnosis not present

## 2023-11-04 DIAGNOSIS — J209 Acute bronchitis, unspecified: Secondary | ICD-10-CM | POA: Diagnosis not present

## 2023-11-11 ENCOUNTER — Encounter (HOSPITAL_BASED_OUTPATIENT_CLINIC_OR_DEPARTMENT_OTHER): Payer: Self-pay | Admitting: Cardiology

## 2023-11-11 ENCOUNTER — Ambulatory Visit (HOSPITAL_BASED_OUTPATIENT_CLINIC_OR_DEPARTMENT_OTHER): Payer: BC Managed Care – PPO | Admitting: Cardiology

## 2023-11-11 VITALS — BP 100/60 | HR 69 | Ht 66.0 in | Wt 133.0 lb

## 2023-11-11 DIAGNOSIS — Z712 Person consulting for explanation of examination or test findings: Secondary | ICD-10-CM

## 2023-11-11 DIAGNOSIS — I495 Sick sinus syndrome: Secondary | ICD-10-CM

## 2023-11-11 DIAGNOSIS — F430 Acute stress reaction: Secondary | ICD-10-CM | POA: Diagnosis not present

## 2023-11-11 DIAGNOSIS — I872 Venous insufficiency (chronic) (peripheral): Secondary | ICD-10-CM

## 2023-11-11 DIAGNOSIS — I951 Orthostatic hypotension: Secondary | ICD-10-CM | POA: Diagnosis not present

## 2023-11-11 DIAGNOSIS — R600 Localized edema: Secondary | ICD-10-CM

## 2023-11-11 NOTE — Patient Instructions (Signed)

## 2023-11-11 NOTE — Progress Notes (Signed)
 Cardiology Office Note:  .   Date:  11/11/2023  ID:  Dana Harvey, DOB Jun 21, 1961, MRN [id number] PCP: Dana Sharps, Dana Harvey  [facility] Providers Cardiologist:  Dana Donning, Dana Harvey Electrophysiologist:  Dana Chandler, Dana Harvey {  History of Present Illness: Dana Harvey   Dana Harvey is a 63 y.o. female with PMH sick sinus syndrome s/p PPM 2015, orthostasis, atypical chest pain. She was previously followed by Dr. Audery Blazing and established care with me on 11/11/23.  Pertinent CV history: reported history of inappropriate sinus tachycardia s/p ablation in [place]  (reported as unsuccessful). Later diagnosed with sick sinus syndrome and had DC-PPM placed in [place]  in 2015. Reports remote cath in [place]  2012 normal. Nuclear stress test here in 2020 normal. Echo 08/2023 with EF 55-60%, normal diastology, normal strain, normal RV, no significant valve disease, RAP 3.  Today: Last seen by Dr. Rodolfo Harvey 08/2023, noted continued orthostatic intolerance, recommendations given. Saw Dana Harvey 07/2023 for chest pain/exertional dyspnea, echo 08/2023 normal.  Feels that her symptoms are often related to where she is/circumstances in her life. Lost her mom last summer. Has felt like she is going nonstop for the last few years. Was feeling poorly when she saw Dana Harvey 07/2023, this was in the peak of her stress/struggle with grief. Feels that she is slowly recovering now.  Has noted feeling that her feet bilaterally feel like they are frozen from mid calf distally. Started about July 2024. Can be purple in color. Better when she warms them in the shower. Has known back issues, pain got better with injections but sensation did not. Compression socks did not help.  Studies Reviewed: Dana Harvey    EKG:       Physical Exam:   VS:  BP 100/60 (BP Location: Left Arm, Patient Position: Sitting, Cuff Size: Normal)   Pulse 69   Ht 5\' 6"  (1.676 m)   Wt 133 lb (60.3 kg)   BMI 21.47 kg/m    Wt Readings from Last 3  Encounters:  11/11/23 133 lb (60.3 kg)  09/22/23 130 lb 3.2 oz (59.1 kg)  08/18/23 129 lb 11.2 oz (58.8 kg)    GEN: Well nourished, well developed in no acute distress HEENT: Normal, moist mucous membranes NECK: No JVD CARDIAC: regular rhythm, normal S1 and S2, no rubs or gallops. No murmur. VASCULAR: Radial and DP pulses 2+ bilaterally. No carotid bruits RESPIRATORY:  Clear to auscultation without rales, wheezing or rhonchi  ABDOMEN: Soft, non-tender, non-distended MUSCULOSKELETAL:  Ambulates independently SKIN: Warm and dry, no pitting edema, strong distal pulses. Minimal nonpitting LE edema bilaterally NEUROLOGIC:  Alert and oriented x 3. No focal neuro deficits noted. PSYCHIATRIC:  Normal affect    ASSESSMENT AND PLAN: .    Stress response: reviewed her symptoms, coping with grief/loss of her mother. Feels that situations/stress affect her symptoms. Reviewed red flag warning signs that need immediate medical attention. Reviewed her reassuring workup/test results  SSS s/p PPM -followed by Dr. Rodolfo Harvey  Bilateral LE edema Venous insufficiency -strong pulses, euvolemic  Dispo: 6 months or sooner as needed  Total time of encounter: I spent 40 minutes dedicated to the care of this patient on the date of this encounter to include pre-visit review of records, face-to-face time with the patient discussing conditions above, and clinical documentation with the electronic health record. We specifically spent time today discussing her personal stress/grief related to loss of her mother, how this can impact symptoms, reviewed her test results. Reviewed concern for cold feet/swelling, reviewed that  arterial flow  is good, discussed possible etiologies.   Signed, Dana Donning, Dana Harvey   Dana Donning, MD, PhD, [facility]  [facility]  [place]  Heart & Vascular at [facility] at [facility] [address] [place], [place]  [address] [phone]

## 2023-12-06 ENCOUNTER — Emergency Department (HOSPITAL_BASED_OUTPATIENT_CLINIC_OR_DEPARTMENT_OTHER)
Admission: EM | Admit: 2023-12-06 | Discharge: 2023-12-06 | Disposition: A | Discharge status: Home / Self Care | Payer: BC Managed Care – PPO | Attending: Emergency Medicine | Admitting: Emergency Medicine

## 2023-12-06 ENCOUNTER — Encounter (HOSPITAL_BASED_OUTPATIENT_CLINIC_OR_DEPARTMENT_OTHER): Payer: Self-pay | Admitting: Emergency Medicine

## 2023-12-06 DIAGNOSIS — M545 Low back pain, unspecified: Secondary | ICD-10-CM | POA: Insufficient documentation

## 2023-12-06 DIAGNOSIS — M5459 Other low back pain: Secondary | ICD-10-CM | POA: Diagnosis not present

## 2023-12-06 DIAGNOSIS — Z950 Presence of cardiac pacemaker: Secondary | ICD-10-CM | POA: Diagnosis not present

## 2023-12-06 MED ORDER — LIDOCAINE 5 % EX PTCH: 1.0000 | MEDICATED_PATCH | CUTANEOUS | 0 refills | Status: DC

## 2023-12-06 MED ORDER — OXYCODONE HCL 5 MG PO TABS
5.0000 mg | ORAL_TABLET | Freq: Once | ORAL | Status: AC
  Administered 2023-12-06: 5 mg via ORAL
  Filled 2023-12-06: qty 1

## 2023-12-06 MED ORDER — CYCLOBENZAPRINE HCL 10 MG PO TABS: 10.0000 mg | ORAL_TABLET | Freq: Two times a day (BID) | ORAL | 0 refills | Status: DC | PRN

## 2023-12-06 MED ORDER — KETOROLAC TROMETHAMINE 60 MG/2ML IM SOLN
60.0000 mg | Freq: Once | INTRAMUSCULAR | Status: AC
  Administered 2023-12-06: 60 mg via INTRAMUSCULAR
  Filled 2023-12-06: qty 2

## 2023-12-06 MED ORDER — METHYLPREDNISOLONE 4 MG PO TBPK: ORAL_TABLET | ORAL | 0 refills | Status: DC

## 2023-12-06 NOTE — ED Provider Notes (Signed)
 [place] EMERGENCY DEPARTMENT AT [facility] Provider Note   CSN: [id number] Arrival date & time: 12/06/23  1051     History  Chief Complaint  Patient presents with   Back Pain    Dana Harvey is a 63 y.o. female.  Patient here with low back pain.  History of the same.  No injury.  Just flared today.  Usually get some injections in the back last 1 was in November.  She denies any urinary symptoms.  No loss of bowel or bladder.  She denies any weakness numbness tingling.  No chest pain shortness of breath.  Toradol  usually helps.  History of pacemaker.  History of seizures.  The history is provided by the patient.       Home Medications Prior to Admission medications   Medication Sig Start Date End Date Taking? Authorizing Provider  cyclobenzaprine  (FLEXERIL ) 10 MG tablet Take 1 tablet (10 mg total) by mouth 2 (two) times daily as needed for muscle spasms. 12/06/23  Yes Kandra Graven, DO  lidocaine  (LIDODERM ) 5 % Place 1 patch onto the skin daily. Remove & Discard patch within 12 hours or as directed by MD 12/06/23  Yes Garritt Molyneux, DO  methylPREDNISolone  (MEDROL  DOSEPAK) 4 MG TBPK tablet Follow package insert 12/06/23  Yes Emersyn Wyss, DO  ALPRAZolam  (XANAX ) 0.5 MG tablet TAKE 1 TABLET BY MOUTH TWICE A DAY AS NEEDED FOR ANXIETY 04/07/22   Webb, Padonda B, FNP  atorvastatin  (LIPITOR) 80 MG tablet Take 1 tablet (80 mg total) by mouth daily. 05/09/22   Webb, Padonda B, FNP  cholecalciferol (VITAMIN D3) 25 MCG (1000 UNIT) tablet Take 1,000 Units by mouth at bedtime. 03/27/20   [provider]  flecainide  (TAMBOCOR ) 50 MG tablet TAKE 1 TABLET BY MOUTH 2 TIMES A DAY 09/15/23   Fernande Elspeth BROCKS, MD  furosemide (LASIX) 20 MG tablet Take 20 mg by mouth daily. 07/30/23   [provider]  pantoprazole  (PROTONIX ) 40 MG tablet Take 2 tablets (80 mg total) by mouth daily. 08/24/20   Koberlein, Junell C, MD  traZODone  (DESYREL ) 100 MG tablet Take 1 tablet (100 mg  total) by mouth at bedtime. 04/11/22   Webb, Padonda B, FNP  zolpidem  (AMBIEN ) 10 MG tablet TAKE 1 TABLET BY MOUTH EVERY DAY AT BEDTIME AS NEEDED FOR SLEEP Strength: 10 mg 05/09/22   Webb, Padonda B, FNP      Allergies    Blueberry [vaccinium angustifolium], Metoprolol, Morphine, Nystatin, Penicillins, Aspirin , Crestor [rosuvastatin calcium ], Fleet enema [enema], Gluten meal, Midodrine  hcl, Miralax [polyethylene glycol], Motegrity  [prucalopride], Wheat, and Other    Review of Systems   Review of Systems  Physical Exam Updated Vital Signs BP 107/68 (BP Location: Right Arm)   Pulse 70   Temp 98.2 F (36.8 C)   Resp 20   Ht 5' 6 (1.676 m)   Wt 60.3 kg   SpO2 100%   BMI 21.46 kg/m  Physical Exam Vitals and nursing note reviewed.  Constitutional:      General: She is not in acute distress.    Appearance: She is well-developed. She is not ill-appearing.  HENT:     Head: Normocephalic and atraumatic.     Nose: Nose normal.     Mouth/Throat:     Mouth: Mucous membranes are moist.  Eyes:     Extraocular Movements: Extraocular movements intact.     Conjunctiva/sclera: Conjunctivae normal.     Pupils: Pupils are equal, round, and reactive to light.  Cardiovascular:  Rate and Rhythm: Normal rate and regular rhythm.     Pulses: Normal pulses.     Heart sounds: Normal heart sounds. No murmur heard. Pulmonary:     Effort: Pulmonary effort is normal. No respiratory distress.     Breath sounds: Normal breath sounds.  Abdominal:     Palpations: Abdomen is soft.     Tenderness: There is no abdominal tenderness.  Musculoskeletal:        General: Tenderness present. No swelling.     Cervical back: Normal range of motion and neck supple.     Comments: Tenderness to the paraspinal lumbar muscles bilaterally with increased  Skin:    General: Skin is warm and dry.     Capillary Refill: Capillary refill takes less than 2 seconds.  Neurological:     General: No focal deficit present.      Mental Status: She is alert and oriented to person, place, and time.     Cranial Nerves: No cranial nerve deficit.     Sensory: No sensory deficit.     Motor: No weakness.     Coordination: Coordination normal.     Comments: 5+ out of 5 strength throughout, normal sensation  Psychiatric:        Mood and Affect: Mood normal.     ED Results / Procedures / Treatments   Labs (all labs ordered are listed, but only abnormal results are displayed) Labs Reviewed - No data to display  EKG None  Radiology No results found.  Procedures Procedures    Medications Ordered in ED Medications  ketorolac  (TORADOL ) injection 60 mg (has no administration in time range)  oxyCODONE  (Oxy IR/ROXICODONE ) immediate release tablet 5 mg (has no administration in time range)    ED Course/ Medical Decision Making/ A&P                                 Medical Decision Making Risk Prescription drug management.   Dana Harvey is here with low back pain.  History of the same.  History of depression anxiety A-fib.  Not on blood thinners.  Overall patient with unremarkable vitals.  Tenderness to the paraspinal lumbar muscles bilaterally.  No midline spinal tenderness.  No trauma history.  Neurovascular neuromuscular intact.  She does not have any symptoms consistent with cauda equina or infectious process.  She is got good pulses on exam.  I do not have any concern for arterial process.  No abdominal pain.  No loss of bowel or bladder.  Overall I do think that this radicular/muscle spasm type process.  Will put her on Medrol  Dosepak, Flexeril , lidocaine  patches.  She is given Toradol  shot and oxycodone  in the ED.  I will have her follow-up with her spine doctor/orthopedic doctor who sent injections in her back before.  She understands return precautions.  Discharged in good condition.  This chart was dictated using voice recognition software.  Despite best efforts to proofread,  errors can occur  which can change the documentation meaning.         Final Clinical Impression(s) / ED Diagnoses Final diagnoses:  Acute low back pain, unspecified back pain laterality, unspecified whether sciatica present    Rx / DC Orders ED Discharge Orders          Ordered    methylPREDNISolone  (MEDROL  DOSEPAK) 4 MG TBPK tablet        12/06/23 1301  cyclobenzaprine  (FLEXERIL ) 10 MG tablet  2 times daily PRN        12/06/23 1301    lidocaine  (LIDODERM ) 5 %  Every 24 hours        12/06/23 1301              Ruthe Cornet, DO 12/06/23 1304

## 2023-12-06 NOTE — ED Triage Notes (Signed)
 C/o lower back pain. Chronic hx of same. No known injuries. Denies urinary symptoms. Hx of nerve impingement.

## 2023-12-06 NOTE — Discharge Instructions (Signed)
 Continue Tylenol 1000 mg every 6 hours as needed.  Take medications as prescribed.  Flexeril  is sedating so do not mix with alcohol drugs or dangerous activities including driving.  This can be helpful at night if you are choosing to still work during the day.  Follow-up with your spine/ortho doctor, primary care doctor.

## 2023-12-14 ENCOUNTER — Ambulatory Visit (INDEPENDENT_AMBULATORY_CARE_PROVIDER_SITE_OTHER): Payer: BC Managed Care – PPO

## 2023-12-14 DIAGNOSIS — I495 Sick sinus syndrome: Secondary | ICD-10-CM | POA: Diagnosis not present

## 2023-12-14 LAB — CUP PACEART REMOTE DEVICE CHECK
Battery Remaining Longevity: 17 mo
Battery Remaining Percentage: 15 %
Battery Voltage: 2.89 V
Brady Statistic AP VP Percent: 1 %
Brady Statistic AP VS Percent: 91 %
Brady Statistic AS VP Percent: 1 %
Brady Statistic AS VS Percent: 8.5 %
Brady Statistic RA Percent Paced: 88 %
Brady Statistic RV Percent Paced: 1 %
Date Time Interrogation Session: 20250216022101
Implantable Lead Connection Status: 753985
Implantable Lead Connection Status: 753985
Implantable Lead Implant Date: 20151111
Implantable Lead Implant Date: 20151111
Implantable Lead Location: 753859
Implantable Lead Location: 753860
Implantable Pulse Generator Implant Date: 20151111
Lead Channel Impedance Value: 350 Ohm
Lead Channel Impedance Value: 480 Ohm
Lead Channel Pacing Threshold Amplitude: 0.5 V
Lead Channel Pacing Threshold Amplitude: 0.75 V
Lead Channel Pacing Threshold Pulse Width: 0.4 ms
Lead Channel Pacing Threshold Pulse Width: 0.4 ms
Lead Channel Sensing Intrinsic Amplitude: 10.8 mV
Lead Channel Sensing Intrinsic Amplitude: 5 mV
Lead Channel Setting Pacing Amplitude: 2 V
Lead Channel Setting Pacing Amplitude: 2.5 V
Lead Channel Setting Pacing Pulse Width: 0.4 ms
Lead Channel Setting Sensing Sensitivity: 0.5 mV
Pulse Gen Model: 2240
Pulse Gen Serial Number: 7694052

## 2023-12-30 DIAGNOSIS — D649 Anemia, unspecified: Secondary | ICD-10-CM | POA: Diagnosis not present

## 2023-12-30 DIAGNOSIS — E559 Vitamin D deficiency, unspecified: Secondary | ICD-10-CM | POA: Diagnosis not present

## 2023-12-30 DIAGNOSIS — E039 Hypothyroidism, unspecified: Secondary | ICD-10-CM | POA: Diagnosis not present

## 2023-12-30 DIAGNOSIS — E785 Hyperlipidemia, unspecified: Secondary | ICD-10-CM | POA: Diagnosis not present

## 2023-12-30 DIAGNOSIS — Z1212 Encounter for screening for malignant neoplasm of rectum: Secondary | ICD-10-CM | POA: Diagnosis not present

## 2023-12-30 DIAGNOSIS — G9332 Myalgic encephalomyelitis/chronic fatigue syndrome: Secondary | ICD-10-CM | POA: Diagnosis not present

## 2023-12-30 DIAGNOSIS — R82998 Other abnormal findings in urine: Secondary | ICD-10-CM | POA: Diagnosis not present

## 2024-01-07 DIAGNOSIS — Z0000 Encounter for general adult medical examination without abnormal findings: Secondary | ICD-10-CM | POA: Diagnosis not present

## 2024-01-07 DIAGNOSIS — M5442 Lumbago with sciatica, left side: Secondary | ICD-10-CM | POA: Diagnosis not present

## 2024-01-12 ENCOUNTER — Encounter: Payer: Self-pay | Admitting: Internal Medicine

## 2024-01-19 NOTE — Progress Notes (Signed)
 Remote pacemaker transmission.

## 2024-01-29 ENCOUNTER — Encounter: Payer: Self-pay | Admitting: Internal Medicine

## 2024-01-29 ENCOUNTER — Ambulatory Visit: Payer: BC Managed Care – PPO | Attending: Internal Medicine | Admitting: Internal Medicine

## 2024-01-29 VITALS — BP 107/69 | HR 68 | Ht 66.0 in | Wt 134.1 lb

## 2024-01-29 DIAGNOSIS — I495 Sick sinus syndrome: Secondary | ICD-10-CM | POA: Diagnosis not present

## 2024-01-29 DIAGNOSIS — Z950 Presence of cardiac pacemaker: Secondary | ICD-10-CM | POA: Diagnosis not present

## 2024-01-29 DIAGNOSIS — I951 Orthostatic hypotension: Secondary | ICD-10-CM

## 2024-01-29 LAB — CUP PACEART INCLINIC DEVICE CHECK
Battery Remaining Longevity: 16 mo
Battery Voltage: 2.87 V
Brady Statistic RA Percent Paced: 88 %
Brady Statistic RV Percent Paced: 0.08 %
Date Time Interrogation Session: 20250404165908
Implantable Lead Connection Status: 753985
Implantable Lead Connection Status: 753985
Implantable Lead Implant Date: 20151111
Implantable Lead Implant Date: 20151111
Implantable Lead Location: 753859
Implantable Lead Location: 753860
Implantable Pulse Generator Implant Date: 20151111
Lead Channel Impedance Value: 375 Ohm
Lead Channel Impedance Value: 475 Ohm
Lead Channel Pacing Threshold Amplitude: 0.75 V
Lead Channel Pacing Threshold Amplitude: 0.75 V
Lead Channel Pacing Threshold Amplitude: 0.75 V
Lead Channel Pacing Threshold Amplitude: 0.75 V
Lead Channel Pacing Threshold Pulse Width: 0.4 ms
Lead Channel Pacing Threshold Pulse Width: 0.4 ms
Lead Channel Pacing Threshold Pulse Width: 0.4 ms
Lead Channel Pacing Threshold Pulse Width: 0.4 ms
Lead Channel Sensing Intrinsic Amplitude: 12 mV
Lead Channel Setting Pacing Amplitude: 2 V
Lead Channel Setting Pacing Amplitude: 2.5 V
Lead Channel Setting Pacing Pulse Width: 0.4 ms
Lead Channel Setting Sensing Sensitivity: 0.5 mV
Pulse Gen Model: 2240
Pulse Gen Serial Number: 7694052

## 2024-01-29 NOTE — Progress Notes (Signed)
 ELECTROPHYSIOLOGY OFFICE NOTE  Patient ID: Dana Harvey, MRN: [id number], DOB/AGE: 1961/05/21 64 y.o. Admit date: (Not on file) Date of Consult: 01/29/2024  Primary Physician: Creola Corn, MD Primary Cardiologist: [facility]             HPI Dana Harvey is a 63 y.o. female seen in follow-up for pacemaker implanted elsewhere in 2015, history of inappropriate sinus tachycardia for which she underwent ablation and exercise intolerance..  Exertional fatigue.  Works at [employer].  Significant stress in general.   Interval evaluation in the ER 6/23 for abrupt mental status changes felt on evaluation to be consistent with psychogenic pseudoseizure.  Chest CT and head CT were normal  She tells me that her mother died 22-May-2024.  She had had MDS and had had acute leukemia from January.  The patient had opportunity to spend a lot of time with her mother in the last couple of months but is still grieving and life is still hard.  Her biggest complaint is of fatigue which has been progressive and relentless and awareness of her Heart beat--  She reminds me that she has been diagnosed with CFS fibromyalgia in the past.  And her husband tells me that she was in bed for a year for diagnosis initially.  DATE TEST EF   8/20 .myoview  59 % No ischemia  8/20 Echo  50-55%   1/22 Echo         Date   Cr              K           Hgb   10/21 0.69 4.0      6/22  0.66 3.7            13.8   6/23 0.7 3.9 13.3   10/24 075  12.7     Past Medical History:  Diagnosis Date   Allergy    Anxiety    Chest pain at rest    normal cors on cath in 2012. Normal coronary CT in 2018. Normal stress test in 05/2019   CVA (cerebral vascular accident) (HCC) 1997   Depression    Diverticulitis    Dyspnea    previous PFT showed normal lung volumes, severe diffusion defect with DLCO 43% predicted   Fainting spell    Frequent headaches    Gastroparesis    GERD (gastroesophageal reflux disease)     Heart murmur    Insomnia    Insomnia    Migraines    Pacemaker    St Jude dual chamber PPM placed on 09/06/2014 by Dr. Gaynelle Adu in [place]   PAF (paroxysmal atrial fibrillation) [facility])    diagnosed by Dr. Gaynelle Adu, remote history, no recurrent   Polyp of intestine 2018   benign per patient   Seizures (HCC)    secondary to potassium level dropping (due to opioid medications per patient)   Thyroid disease       Surgical History:  Past Surgical History:  Procedure Laterality Date   ABDOMINAL HYSTERECTOMY  1993   heavy vaginal bleeding; later went in and removed Right ovary   APPENDECTOMY  1984   ATRIAL ABLATION SURGERY  2012   BREAST EXCISIONAL BIOPSY     bilateral; benign   CARDIAC CATHETERIZATION  2012   normal cors on cath in [facility] tx   CESAREAN SECTION     [phone]   CHOLECYSTECTOMY  2010  ELECTROPHYSIOLOGY STUDY  around 2013   with ablation for clinically suspected inappropriate sinus tachycardia, by Dr. Annice Pih   LOOP RECORDER IMPLANT  2013   by Dr. Annice Pih   PACEMAKER INSERTION  09/06/2014   St Jude Assurity dual chamber PPM, Dr Onalee Hua [place]--per patient; put in after ablation     Home Meds:   MAR reviewed after visit     Allergies:  Allergies  Allergen Reactions   Blueberry [Vaccinium Angustifolium] Anaphylaxis   Metoprolol Shortness Of Breath, Anxiety, Palpitations and Other (See Comments)     Hallucinations, low BP   Morphine Anaphylaxis    Cardiac arrest   Nystatin Anaphylaxis and Cough   Penicillins Anaphylaxis    Per Pt "lost hearing for a few days"   Aspirin Itching and Tinitus   Crestor [Rosuvastatin Calcium] Other (See Comments)    Pain in legs   Fleet Enema [Enema] Diarrhea    vomiting   Gluten Meal Diarrhea and Nausea And Vomiting   Midodrine Hcl Other (See Comments)    Tachycardia per patient Suicidal thoughts   Miralax [Polyethylene Glycol] Diarrhea    Vomiting, patient states this causes a-fib and leads to syncope    Motegrity [Prucalopride] Other (See Comments)    Suicidal thoughts, fatigue    Wheat Other (See Comments)    Hiccups    Other Rash    Band-Aid            ROS:  Please see the history of present illness.     All other systems reviewed and negative.   Physical Exam: BP 107/69 Comment: standing @ 3 mins / dizziness  Pulse 68   Ht 5\' 6"  (1.676 m)   Wt 134 lb 1.6 oz (60.8 kg)   SpO2 99%   BMI 21.64 kg/m  Well developed and well nourished in no acute distress HENT normal Neck supple with JVP-flat Clear Device pocket well healed; without hematoma or erythema.  There is no tethering  Regular rate and rhythm, no  gallop No / murmur Abd-soft with active BS No Clubbing cyanosis  edema Skin-warm and dry A & Oriented  Grossly normal sensory and motor function  ECG atrial pacing at 70 Intervals 18/08/40  Device function is normal. Programming changes lower rate increased from 70--80 and paced AV delay increased from 200 to 300 ms See Paceart for details    Assessment and Plan:   Inappropriate sinus tachy s/p ablation  Sinus node dysfunction secondary/chronotropic incompetence  Pacemaker St Jude   orthostatic intolerance  Headaches  Dyspnea on exertion  Grief  Dizziness.  Fatigue-CFS     Patient has chronic dizziness.  Is largely nonpositional.  This suggest a neurological component.  There issues previously raised about psychogenic pseudoseizures.  I think neurological evaluation is appropriate.  I will ask her to talk to her PCP about this.  She has a non-MRI compatible device; if MRI is necessary we will see if we can get it done for her at either [facility] or [facility].  We reprogrammed her device to a lower limit of 80 bpm.  She had sensations of her heartbeat which may have been moving between atrial sensing and atrial pacing.  For right now we have left off the rest rate but we may activate it subsequently.

## 2024-01-29 NOTE — Patient Instructions (Signed)
 Medication Instructions:  Your physician recommends that you continue on your current medications as directed. Please refer to the Current Medication list given to you today.  *If you need a refill on your cardiac medications before your next appointment, please call your pharmacy*  Lab Work: None ordered.  If you have labs (blood work) drawn today and your tests are completely normal, you will receive your results only by: MyChart Message (if you have MyChart) OR A paper copy in the mail If you have any lab test that is abnormal or we need to change your treatment, we will call you to review the results.  Testing/Procedures: None ordered.   Follow-Up: At [facility], you and your health needs are our priority.  As part of our continuing mission to provide you with exceptional heart care, our providers are all part of one team.  This team includes your primary Cardiologist (physician) and Advanced Practice Providers or APPs (Physician Assistants and Nurse Practitioners) who all work together to provide you with the care you need, when you need it.  Your next appointment:   3 months with Dr Graciela Husbands      1st Floor: - Lobby - Registration  - Pharmacy  - Lab - Cafe  2nd Floor: - PV Lab - Diagnostic Testing (echo, CT, nuclear med)  3rd Floor: - Vacant  4th Floor: - TCTS (cardiothoracic surgery) - AFib Clinic - Structural Heart Clinic - Vascular Surgery  - Vascular Ultrasound  5th Floor: - HeartCare Cardiology (general and EP) - Clinical Pharmacy for coumadin, hypertension, lipid, weight-loss medications, and med management appointments    Valet parking services will be available as well.

## 2024-02-01 ENCOUNTER — Encounter: Payer: Self-pay | Admitting: Internal Medicine

## 2024-02-01 NOTE — Telephone Encounter (Signed)
 Pt c/o symptoms of feeling HR all the time, increased anxiety, and too much energy following programming changes 4/4//25. Rest rate could be enabled but patient will be reluctant. Unsure what to do other than program back to what previous programming was.

## 2024-02-03 NOTE — Telephone Encounter (Signed)
 I would sweitch her to 75 as per her request  Thks

## 2024-02-03 NOTE — Telephone Encounter (Signed)
 Also I dont remember why the upper sensor and tracking rates were changed.  She apaces a lot I would return the LRL >>75 and restore the sensor and tracking rates to what she came in on 4/4 Thanks SK

## 2024-02-04 ENCOUNTER — Ambulatory Visit: Attending: Cardiology

## 2024-02-04 DIAGNOSIS — I495 Sick sinus syndrome: Secondary | ICD-10-CM

## 2024-02-04 LAB — CUP PACEART INCLINIC DEVICE CHECK
Date Time Interrogation Session: 20250410144055
Implantable Lead Connection Status: 753985
Implantable Lead Connection Status: 753985
Implantable Lead Implant Date: 20151111
Implantable Lead Implant Date: 20151111
Implantable Lead Location: 753859
Implantable Lead Location: 753860
Implantable Pulse Generator Implant Date: 20151111
Pulse Gen Model: 2240
Pulse Gen Serial Number: 7694052

## 2024-02-04 NOTE — Patient Instructions (Signed)
 Follow up as scheduled.

## 2024-02-04 NOTE — Progress Notes (Signed)
 Patient brought in acutely due to feeling poorly following changes made in clinic on 01/29/24.  Device interrogation and reprogramming performed today in office by Lanora Manis. Jude.   1.  Rate response made less aggressive due to patient feeling HR's off to the races with limited activity. 2.  Base rate decreased from 80 to 75 3.  Max track rate increased from 100 to 120. 4.  Max Sensor rate increased from 105 to 140. 5.  VIP adjusted to prevent prolonged AV delays to 450 per programming on 01/29/24.   (See full details of changes in attached report in PACEART, report to export to EPIC).   After adjustments made, patient walked around clinic with immediate improvement in how she was feeling.  Will continue to monitor.

## 2024-02-22 ENCOUNTER — Encounter: Payer: Self-pay | Admitting: Internal Medicine

## 2024-03-01 ENCOUNTER — Telehealth: Payer: Self-pay | Admitting: Internal Medicine

## 2024-03-01 NOTE — Telephone Encounter (Signed)
 Lmom letting pt know we received medical release form and we can not send to medical records department due to 2 office on the form must come to  office to sign medical release form or go to [facility] and fill out another medical release form for each office that patient is requesting records

## 2024-03-03 DIAGNOSIS — Z780 Asymptomatic menopausal state: Secondary | ICD-10-CM | POA: Diagnosis not present

## 2024-03-09 DIAGNOSIS — M797 Fibromyalgia: Secondary | ICD-10-CM | POA: Diagnosis not present

## 2024-03-14 ENCOUNTER — Ambulatory Visit (INDEPENDENT_AMBULATORY_CARE_PROVIDER_SITE_OTHER): Payer: BC Managed Care – PPO

## 2024-03-14 DIAGNOSIS — I495 Sick sinus syndrome: Secondary | ICD-10-CM | POA: Diagnosis not present

## 2024-03-15 ENCOUNTER — Encounter: Payer: Self-pay | Admitting: Internal Medicine

## 2024-03-15 LAB — CUP PACEART REMOTE DEVICE CHECK
Battery Remaining Longevity: 14 mo
Battery Remaining Percentage: 13 %
Battery Voltage: 2.87 V
Brady Statistic AP VP Percent: 1 %
Brady Statistic AP VS Percent: 95 %
Brady Statistic AS VP Percent: 1 %
Brady Statistic AS VS Percent: 4.7 %
Brady Statistic RA Percent Paced: 95 %
Brady Statistic RV Percent Paced: 1 %
Date Time Interrogation Session: 20250519020015
Implantable Lead Connection Status: 753985
Implantable Lead Connection Status: 753985
Implantable Lead Implant Date: 20151111
Implantable Lead Implant Date: 20151111
Implantable Lead Location: 753859
Implantable Lead Location: 753860
Implantable Pulse Generator Implant Date: 20151111
Lead Channel Impedance Value: 350 Ohm
Lead Channel Impedance Value: 460 Ohm
Lead Channel Pacing Threshold Amplitude: 0.75 V
Lead Channel Pacing Threshold Amplitude: 0.75 V
Lead Channel Pacing Threshold Pulse Width: 0.4 ms
Lead Channel Pacing Threshold Pulse Width: 0.4 ms
Lead Channel Sensing Intrinsic Amplitude: 4.2 mV
Lead Channel Sensing Intrinsic Amplitude: 9.2 mV
Lead Channel Setting Pacing Amplitude: 2 V
Lead Channel Setting Pacing Amplitude: 2.5 V
Lead Channel Setting Pacing Pulse Width: 0.4 ms
Lead Channel Setting Sensing Sensitivity: 0.5 mV
Pulse Gen Model: 2240
Pulse Gen Serial Number: 7694052

## 2024-03-21 ENCOUNTER — Ambulatory Visit: Payer: Self-pay | Admitting: Cardiovascular Disease

## 2024-04-01 ENCOUNTER — Telehealth: Payer: Self-pay

## 2024-04-01 ENCOUNTER — Encounter: Payer: Self-pay | Admitting: Internal Medicine

## 2024-04-01 MED ORDER — FLECAINIDE ACETATE 50 MG PO TABS: 50.0000 mg | ORAL_TABLET | Freq: Every day | ORAL | 1 refills | Status: DC

## 2024-04-01 NOTE — Telephone Encounter (Signed)
 Please advise on this RX. Pt of Dr. Rodolfo Clan thinks that she is supposed to be taking her flecainade once a day instead of twice.

## 2024-04-04 DIAGNOSIS — M5416 Radiculopathy, lumbar region: Secondary | ICD-10-CM | POA: Diagnosis not present

## 2024-04-07 DIAGNOSIS — M5416 Radiculopathy, lumbar region: Secondary | ICD-10-CM | POA: Diagnosis not present

## 2024-04-15 DIAGNOSIS — M5416 Radiculopathy, lumbar region: Secondary | ICD-10-CM | POA: Diagnosis not present

## 2024-05-02 NOTE — Addendum Note (Signed)
 Addended by: TAWNI DRILLING D on: 05/02/2024 11:47 AM   Modules accepted: Orders

## 2024-05-02 NOTE — Progress Notes (Signed)
 Remote pacemaker transmission.

## 2024-05-05 ENCOUNTER — Encounter: Payer: Self-pay | Admitting: Internal Medicine

## 2024-05-13 ENCOUNTER — Encounter: Admitting: Internal Medicine

## 2024-05-25 ENCOUNTER — Other Ambulatory Visit: Payer: Self-pay | Admitting: Internal Medicine

## 2024-05-25 DIAGNOSIS — Z1231 Encounter for screening mammogram for malignant neoplasm of breast: Secondary | ICD-10-CM

## 2024-06-09 DIAGNOSIS — M5442 Lumbago with sciatica, left side: Secondary | ICD-10-CM | POA: Diagnosis not present

## 2024-06-09 DIAGNOSIS — M797 Fibromyalgia: Secondary | ICD-10-CM | POA: Diagnosis not present

## 2024-06-09 LAB — LAB REPORT - SCANNED: EGFR (Non-African Amer.): 101.3

## 2024-06-13 ENCOUNTER — Ambulatory Visit (INDEPENDENT_AMBULATORY_CARE_PROVIDER_SITE_OTHER): Payer: BC Managed Care – PPO

## 2024-06-13 DIAGNOSIS — I495 Sick sinus syndrome: Secondary | ICD-10-CM | POA: Diagnosis not present

## 2024-06-14 LAB — CUP PACEART REMOTE DEVICE CHECK
Battery Remaining Longevity: 12 mo
Battery Remaining Percentage: 12 %
Battery Voltage: 2.86 V
Brady Statistic AP VP Percent: 1 %
Brady Statistic AP VS Percent: 93 %
Brady Statistic AS VP Percent: 1 %
Brady Statistic AS VS Percent: 6.6 %
Brady Statistic RA Percent Paced: 93 %
Brady Statistic RV Percent Paced: 1 %
Date Time Interrogation Session: 20250818024427
Implantable Lead Connection Status: 753985
Implantable Lead Connection Status: 753985
Implantable Lead Implant Date: 20151111
Implantable Lead Implant Date: 20151111
Implantable Lead Location: 753859
Implantable Lead Location: 753860
Implantable Pulse Generator Implant Date: 20151111
Lead Channel Impedance Value: 350 Ohm
Lead Channel Impedance Value: 460 Ohm
Lead Channel Pacing Threshold Amplitude: 0.75 V
Lead Channel Pacing Threshold Amplitude: 0.75 V
Lead Channel Pacing Threshold Pulse Width: 0.4 ms
Lead Channel Pacing Threshold Pulse Width: 0.4 ms
Lead Channel Sensing Intrinsic Amplitude: 11.4 mV
Lead Channel Sensing Intrinsic Amplitude: 4.6 mV
Lead Channel Setting Pacing Amplitude: 2 V
Lead Channel Setting Pacing Amplitude: 2.5 V
Lead Channel Setting Pacing Pulse Width: 0.4 ms
Lead Channel Setting Sensing Sensitivity: 0.5 mV
Pulse Gen Model: 2240
Pulse Gen Serial Number: 7694052

## 2024-06-16 ENCOUNTER — Ambulatory Visit: Payer: Self-pay | Admitting: Cardiovascular Disease

## 2024-06-23 ENCOUNTER — Encounter: Payer: Self-pay | Admitting: Cardiovascular Disease

## 2024-06-23 ENCOUNTER — Ambulatory Visit: Attending: Cardiovascular Disease | Admitting: Cardiovascular Disease

## 2024-06-23 VITALS — BP 130/84 | HR 75 | Ht 66.0 in | Wt 131.4 lb

## 2024-06-23 DIAGNOSIS — M47816 Spondylosis without myelopathy or radiculopathy, lumbar region: Secondary | ICD-10-CM | POA: Diagnosis not present

## 2024-06-23 DIAGNOSIS — I4711 Inappropriate sinus tachycardia, so stated: Secondary | ICD-10-CM

## 2024-06-23 DIAGNOSIS — I495 Sick sinus syndrome: Secondary | ICD-10-CM

## 2024-06-23 DIAGNOSIS — I4891 Unspecified atrial fibrillation: Secondary | ICD-10-CM

## 2024-06-23 DIAGNOSIS — R002 Palpitations: Secondary | ICD-10-CM

## 2024-06-23 NOTE — Progress Notes (Signed)
 Electrophysiology Office Note:    Date:  06/23/2024   ID:  Meagon, Duskin 1961/01/19, MRN [id number]  PCP:  Onita Rush, MD   [facility] Providers Cardiologist:  Shelda Bruckner, MD Electrophysiologist:  Elspeth Sage, MD     Referring MD: Onita Rush, MD   History of Present Illness:    Kaylinn Dedic is a 63 y.o. female with a medical history significant for inappropriate sinus tachycardia for which she underwent ablation, chronic fatigue syndrome who presents for EP follow-up.       History of Present Illness  The patient has a history of sick sinus syndrome and ablation of the sinus node in [place] .  Subsequently, she received a dual-chamber pacemaker, per the patient's recollection, for atrial fibrillation. Since establishing with our group in 2015, she has demonstrated sinus node dysfunction.   I see no report of atrial fibrillation, but she has been taking flecainide .  She has a few issues.  Of most concern to her currently, she has frequent episodes of dizziness with standing.  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  She is also very attuned to her heart rate, and does not like the sensation when her heart rate increases above 80 beats a minute or so.  I reviewed the rate plots from her Apple Watch which show a variation between approximately 75 to 120 bpm.        Today, she is at baseline and has no new or acute complaints.  EKGs/Labs/Other Studies Reviewed Today:     Echocardiogram:  TTE November 2024 LVEF 55 to 60%.    EKG:   EKG Interpretation Date/Time:  Thursday June 23 2024 15:23:26 EDT Ventricular Rate:  75 PR Interval:  148 QRS Duration:  86 QT Interval:  386 QTC Calculation: 431 R Axis:   106  Text Interpretation: Atrial-paced rhythm Rightward axis When compared with ECG of 29-Jan-2024 15:19, No significant change was found Confirmed by Nancey Scotts [phone]) on  06/23/2024 3:30:59 PM     Physical Exam:    VS:  BP 130/84 (BP Location: Left Arm, Patient Position: Sitting, Cuff Size: Normal)   Pulse 75   Ht 5' 6 (1.676 m)   Wt 131 lb 6.4 oz (59.6 kg)   SpO2 98%   BMI 21.21 kg/m     Wt Readings from Last 3 Encounters:  06/23/24 131 lb 6.4 oz (59.6 kg)  01/29/24 134 lb 1.6 oz (60.8 kg)  12/06/23 132 lb 15 oz (60.3 kg)     GEN: Well nourished, well developed in no acute distress CARDIAC: RRR, no murmurs, rubs, gallops The device site is normal -- no tenderness, edema, drainage, redness, threatened erosion.  RESPIRATORY:  Normal work of breathing MUSCULOSKELETAL: no edema    ASSESSMENT & PLAN:     Inappropriate sinus tachycardia status post ablation Per patient, ablation was performed in [place]  Now lacking sinus node function  Chronotropically incompetent  She is predominantly atrial paced  History of atrial fibrillation She has been maintained on flecainide  Will DC flecainide  -- a high risk medication -- today and monitor If she has recurrence, would pursue ablation  [facility] pacemaker Status post sinus node ablation Device is not MRI compatible  Orthostatic intolerance We could increase her heart rate response, but I think that she will be symptomatic with the increased rates She oftentimes does not tolerate compression stockings, which cause discomfort and result in her feet turning blue  Dizziness Per Dr. Fernande, largely nonpositional. She was referred for neurological evaluation by Dr. Fernande  This is my first time meeting the patient. I reviewed many notes by Dr Fernande dating back to 2020. I also reviewed notes by Drs. Christopher and Elba in addition to notes by [facility]. I independently reviewed and interpreted multiple device tracings dating back to 2024. I had a prolonged discussion and interview with the patient. I spent 75 minutes on this visit.  Signed, Eulas FORBES Furbish, MD  06/23/2024 6:43 PM    Deputy  HeartCare

## 2024-06-23 NOTE — Patient Instructions (Signed)
 Medication Instructions:  Your physician has recommended you make the following change in your medication:   ** Stop Flecainide   *If you need a refill on your cardiac medications before your next appointment, please call your pharmacy*  Lab Work: None ordered.   If you have labs (blood work) drawn today and your tests are completely normal, you will receive your results only by: MyChart Message (if you have MyChart) OR A paper copy in the mail If you have any lab test that is abnormal or we need to change your treatment, we will call you to review the results.  Testing/Procedures: None ordered.   Follow-Up: At [facility], you and your health needs are our priority.  As part of our continuing mission to provide you with exceptional heart care, our providers are all part of one team.  This team includes your primary Cardiologist (physician) and Advanced Practice Providers or APPs (Physician Assistants and Nurse Practitioners) who all work together to provide you with the care you need, when you need it.  Your next appointment:   3 months with Dr Mealor

## 2024-07-07 ENCOUNTER — Encounter: Payer: Self-pay | Admitting: Cardiovascular Disease

## 2024-07-07 DIAGNOSIS — M6281 Muscle weakness (generalized): Secondary | ICD-10-CM | POA: Diagnosis not present

## 2024-07-07 DIAGNOSIS — M47816 Spondylosis without myelopathy or radiculopathy, lumbar region: Secondary | ICD-10-CM | POA: Diagnosis not present

## 2024-07-07 DIAGNOSIS — S39012D Strain of muscle, fascia and tendon of lower back, subsequent encounter: Secondary | ICD-10-CM | POA: Diagnosis not present

## 2024-07-14 ENCOUNTER — Ambulatory Visit

## 2024-07-20 NOTE — Progress Notes (Signed)
 Remote PPM Transmission

## 2024-07-21 DIAGNOSIS — R059 Cough, unspecified: Secondary | ICD-10-CM | POA: Diagnosis not present

## 2024-07-28 ENCOUNTER — Ambulatory Visit (HOSPITAL_BASED_OUTPATIENT_CLINIC_OR_DEPARTMENT_OTHER): Admitting: Cardiology

## 2024-07-28 ENCOUNTER — Encounter (HOSPITAL_BASED_OUTPATIENT_CLINIC_OR_DEPARTMENT_OTHER): Payer: Self-pay | Admitting: Cardiology

## 2024-07-28 VITALS — BP 115/66 | HR 77 | Resp 16 | Ht 66.5 in | Wt 133.0 lb

## 2024-07-28 DIAGNOSIS — I4711 Inappropriate sinus tachycardia, so stated: Secondary | ICD-10-CM

## 2024-07-28 DIAGNOSIS — E7800 Pure hypercholesterolemia, unspecified: Secondary | ICD-10-CM

## 2024-07-28 DIAGNOSIS — I495 Sick sinus syndrome: Secondary | ICD-10-CM | POA: Diagnosis not present

## 2024-07-28 DIAGNOSIS — G901 Familial dysautonomia [Riley-Day]: Secondary | ICD-10-CM | POA: Diagnosis not present

## 2024-07-28 DIAGNOSIS — I951 Orthostatic hypotension: Secondary | ICD-10-CM

## 2024-07-28 DIAGNOSIS — Z950 Presence of cardiac pacemaker: Secondary | ICD-10-CM

## 2024-07-28 NOTE — Patient Instructions (Addendum)
-  avoid dehydration. Often it requires high volumes of fluids, often with salt/electrolytes included, to stay hydrated. People with orthostasis are very sensitive to fluid shifts and dehydration. Oral rehydration is preferred, and routine use of IV fluids is not recommended. -if tolerated, compression stocking can assist with fluid management and prevent pooling in the legs. -slow position changes are recommended -if there is a feeling of severe lightheadedness, like near to passing out, recommend lying on the floor on the back, with legs elevated up on a chair or up against the wall. -the best long term management is gradual exercise conditioning. I recommend seated exercises such as bike to start, to avoid the risk of falling with lightheadedness. Exercise programs, either through supervised programs like cardiac rehab or through personal programs, should focus on gradually increasing exercise tolerance and conditioning.    -this is a link to specific exercise recommendations: [email or web address]  No other recommendations. Follow up in 6 months. Call if anything is needed sooner.

## 2024-07-28 NOTE — Progress Notes (Signed)
 Cardiology Office Note:  .   Date:  07/28/2024  ID:  Dana Harvey, DOB July 15, 1961, MRN [id number] PCP: Dana Rush, MD  [facility] Providers Cardiologist:  Dana Bruckner, MD Electrophysiologist:  Dana FORBES Furbish, MD {  History of Present Illness: Dana   Keyshawna Harvey is a 63 y.o. female with PMH inappropriate sinus tachycardia s/p ablation ([place] ) s/p PPM 2015 (not MRI compatible), remote paroxysmal atrial fibrillation, orthostasis, atypical chest pain, hypercholesterolemia. She was previously followed by Dr. Pietro and Dr. Fernande, and established care with me on 11/11/23.  Pertinent CV history: reported history of inappropriate sinus tachycardia s/p ablation in [place]  (reported as unsuccessful). Later diagnosed with sick sinus syndrome and had DC-PPM placed in [place]  in 2015. Reports remote cath in [place]  2012 normal. Nuclear stress test here in 2020 normal. Echo 08/2023 with EF 55-60%, normal diastology, normal strain, normal RV, no significant valve disease, RAP 3.  See note from Dana Harvey 07/05/2020 for summary of prior cardiology procedures in [place] .   Today: Saw Dr. Furbish 06/23/24 to establish care in EP. Flecainide  d/c'ed. She has felt somewhat better since stopping this. She still notes that she has rises in her heart rate up to the 120s with minimal position changes or activity. Feels short of breath when her heart rate is up.   Walks about 3 miles/day at work but limited activity outside of work. Back pain limits her walking.  Notes that one side of her body gets red and one does not (always left sided). When her weight is over 130 lbs she feels worse. She also always feels cold. We discussed dysautonomia at length today, see below.  Reviewed her chest pain, which is unchanged.    Studies Reviewed: Dana    EKG:       Physical Exam:   VS:  BP 115/66 (BP Location: Left Arm, Patient Position: Sitting, Cuff Size: Normal)   Pulse 77   Resp 16   Ht 5'  6.5 (1.689 m)   Wt 133 lb (60.3 kg)   SpO2 98%   BMI 21.15 kg/m    Wt Readings from Last 3 Encounters:  07/28/24 133 lb (60.3 kg)  06/23/24 131 lb 6.4 oz (59.6 kg)  01/29/24 134 lb 1.6 oz (60.8 kg)    GEN: Well nourished, well developed in no acute distress HEENT: Normal, moist mucous membranes NECK: No JVD CARDIAC: regular rhythm, normal S1 and S2, no rubs or gallops. No murmur. VASCULAR: Radial and DP pulses 2+ bilaterally. No carotid bruits RESPIRATORY:  Clear to auscultation without rales, wheezing or rhonchi  ABDOMEN: Soft, non-tender, non-distended MUSCULOSKELETAL:  Ambulates independently SKIN: Warm and dry, no edema NEUROLOGIC:  Alert and oriented x 3. No focal neuro deficits noted. PSYCHIATRIC:  Normal affect    ASSESSMENT AND PLAN: .    Dysautonomia, with orthostatic symptoms and history of inappropriate sinus tach s/p ablation SSS S/P s/p PPM Paroxysmal atrial fibrillation (reported 2015) -followed by Dr. Furbish -now off flecainide  as she has not had any recent afib. Not currently on anticoagulation -has HR elevations with changing position -I suspect that her worsening symptoms may be due to dysautonomia given that she has been less active due to back pain -we discussed general management  Intermittent bilateral LE edema -strong pulses, euvolemic  Hypercholesterolemia -takes 40 mg atorvastatin , but has myalgia. Wants to optimize CV risk factors but also wants to avoid side effects.   Dispo: 6 months or sooner as needed  Total time of  encounter: I spent 41 minutes dedicated to the care of this patient on the date of this encounter to include pre-visit review of records, face-to-face time with the patient discussing conditions above, and clinical documentation with the electronic health record. We specifically spent time today discussing history, symptoms, dyasutonomia, management strategies as noted   Signed, Dana Bruckner, MD   Dana Bruckner,  MD, PhD, [facility] [place]  [facility]  [place]  [facility] at [facility] at [facility] [address], [address] [place], [place] [address] [phone]

## 2024-08-03 DIAGNOSIS — M5416 Radiculopathy, lumbar region: Secondary | ICD-10-CM | POA: Diagnosis not present

## 2024-08-04 ENCOUNTER — Ambulatory Visit
Admission: RE | Admit: 2024-08-04 | Discharge: 2024-08-04 | Disposition: A | Discharge status: Home / Self Care | Source: Ambulatory Visit | Attending: Internal Medicine | Admitting: Internal Medicine

## 2024-08-04 DIAGNOSIS — M47816 Spondylosis without myelopathy or radiculopathy, lumbar region: Secondary | ICD-10-CM | POA: Insufficient documentation

## 2024-08-04 DIAGNOSIS — Z1231 Encounter for screening mammogram for malignant neoplasm of breast: Secondary | ICD-10-CM | POA: Diagnosis not present

## 2024-08-17 DIAGNOSIS — M47816 Spondylosis without myelopathy or radiculopathy, lumbar region: Secondary | ICD-10-CM | POA: Diagnosis not present

## 2024-09-06 DIAGNOSIS — N390 Urinary tract infection, site not specified: Secondary | ICD-10-CM | POA: Diagnosis not present

## 2024-09-08 DIAGNOSIS — M47816 Spondylosis without myelopathy or radiculopathy, lumbar region: Secondary | ICD-10-CM | POA: Diagnosis not present

## 2024-09-12 ENCOUNTER — Ambulatory Visit (INDEPENDENT_AMBULATORY_CARE_PROVIDER_SITE_OTHER): Payer: BC Managed Care – PPO

## 2024-09-12 DIAGNOSIS — R002 Palpitations: Secondary | ICD-10-CM

## 2024-09-13 LAB — CUP PACEART REMOTE DEVICE CHECK
Battery Remaining Longevity: 12 mo
Battery Remaining Percentage: 12 %
Battery Voltage: 2.86 V
Brady Statistic AP VP Percent: 1 %
Brady Statistic AP VS Percent: 93 %
Brady Statistic AS VP Percent: 1 %
Brady Statistic AS VS Percent: 7.1 %
Brady Statistic RA Percent Paced: 93 %
Brady Statistic RV Percent Paced: 1 %
Date Time Interrogation Session: 20251117020017
Implantable Lead Connection Status: 753985
Implantable Lead Connection Status: 753985
Implantable Lead Implant Date: 20151111
Implantable Lead Implant Date: 20151111
Implantable Lead Location: 753859
Implantable Lead Location: 753860
Implantable Pulse Generator Implant Date: 20151111
Lead Channel Impedance Value: 430 Ohm
Lead Channel Impedance Value: 460 Ohm
Lead Channel Pacing Threshold Amplitude: 0.5 V
Lead Channel Pacing Threshold Amplitude: 0.75 V
Lead Channel Pacing Threshold Pulse Width: 0.4 ms
Lead Channel Pacing Threshold Pulse Width: 0.4 ms
Lead Channel Sensing Intrinsic Amplitude: 4.7 mV
Lead Channel Sensing Intrinsic Amplitude: 9 mV
Lead Channel Setting Pacing Amplitude: 2 V
Lead Channel Setting Pacing Amplitude: 2.5 V
Lead Channel Setting Pacing Pulse Width: 0.4 ms
Lead Channel Setting Sensing Sensitivity: 0.5 mV
Pulse Gen Model: 2240
Pulse Gen Serial Number: 7694052

## 2024-09-14 NOTE — Progress Notes (Signed)
 Remote PPM Transmission

## 2024-09-26 ENCOUNTER — Ambulatory Visit: Payer: Self-pay | Admitting: Cardiovascular Disease

## 2024-10-06 ENCOUNTER — Ambulatory Visit: Attending: Internal Medicine | Admitting: Cardiovascular Disease

## 2024-10-06 ENCOUNTER — Encounter: Payer: Self-pay | Admitting: Cardiovascular Disease

## 2024-10-06 VITALS — BP 128/80 | HR 75 | Resp 16 | Ht 66.0 in | Wt 134.0 lb

## 2024-10-06 DIAGNOSIS — I495 Sick sinus syndrome: Secondary | ICD-10-CM | POA: Diagnosis not present

## 2024-10-06 DIAGNOSIS — Z950 Presence of cardiac pacemaker: Secondary | ICD-10-CM | POA: Diagnosis not present

## 2024-10-06 NOTE — Patient Instructions (Signed)
 Medication Instructions:  Your physician recommends that you continue on your current medications as directed. Please refer to the Current Medication list given to you today.  *If you need a refill on your cardiac medications before your next appointment, please call your pharmacy*  Lab Work: None ordered.  If you have labs (blood work) drawn today and your tests are completely normal, you will receive your results only by: MyChart Message (if you have MyChart) OR A paper copy in the mail If you have any lab test that is abnormal or we need to change your treatment, we will call you to review the results.  Testing/Procedures: None ordered.   Follow-Up: At [facility], you and your health needs are our priority.  As part of our continuing mission to provide you with exceptional heart care, our providers are all part of one team.  This team includes your primary Cardiologist (physician) and Advanced Practice Providers or APPs (Physician Assistants and Nurse Practitioners) who all work together to provide you with the care you need, when you need it.  Your next appointment:   6 month(s)  Provider:   You will see one of the following Advanced Practice Providers on your designated Care Team:   Charlies Arthur, [place] Ozell Jodie Passey, PA-C Suzann Riddle, NP Daphne Barrack, NP Artist Pouch, PA-C     We recommend signing up for the patient portal called MyChart.  Sign up information is provided on this After Visit Summary.  MyChart is used to connect with patients for Virtual Visits (Telemedicine).  Patients are able to view lab/test results, encounter notes, upcoming appointments, etc.  Non-urgent messages can be sent to your provider as well.   To learn more about what you can do with MyChart, go to [email or web address].

## 2024-10-06 NOTE — Progress Notes (Signed)
 Electrophysiology Office Note:    Date:  10/06/2024   ID:  [name], [name] 25-Jul-1961, MRN [id number]  PCP:  Dana Rush, MD   [facility] Providers Cardiologist:  Dana Bruckner, MD Electrophysiologist:  Dana FORBES Furbish, MD     Referring MD: Dana Rush, MD   History of Present Illness:    Dana Harvey is a 63 y.o. female with a medical history significant for inappropriate sinus tachycardia for which she underwent ablation, chronic fatigue syndrome who presents for EP follow-up.       History of Present Illness  The patient has a history of sick sinus syndrome and ablation of the sinus node in [place] .  Subsequently, she received a dual-chamber pacemaker, per the patient's recollection, for atrial fibrillation. Since establishing with our group in 2015, she has demonstrated sinus node dysfunction.   I see no report of atrial fibrillation, but she has been taking flecainide .  She has a few issues.  Of most concern to her currently, she has frequent episodes of dizziness with standing.  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  She is also very attuned to her heart rate, and does not like the sensation when her heart rate increases above 80 beats a minute or so.  I reviewed the rate plots from her Apple Watch which show a variation between approximately 75 to 120 bpm.   At her last visit, we discontinued flecainide .  Device interrogation today shows no recurrence of atrial fibrillation.  She does occasionally notice brief episodes of rapid heart rates consistent with her known atrial tachycardia.  She thinks these have actually gotten better since stopping flecainide       Today, she is at baseline and has no new or acute complaints.  EKGs/Labs/Other Studies Reviewed Today:     Echocardiogram:  TTE November 2024 LVEF 55 to 60%.    EKG:   EKG Interpretation Date/Time:  Thursday October 06 2024 14:39:11 EST Ventricular Rate:  75 PR Interval:  162 QRS Duration:  84 QT Interval:  378 QTC Calculation: 422 R Axis:   83  Text Interpretation: Atrial-paced rhythm When compared with ECG of 23-Jun-2024 15:23, No significant change was found Confirmed by Harvey Dana [phone]) on 10/06/2024 2:52:56 PM     Physical Exam:    VS:  BP 128/80 (BP Location: Left Arm, Patient Position: Sitting, Cuff Size: Normal)   Pulse 75   Resp 16   Ht 5' 6 (1.676 m)   Wt 134 lb (60.8 kg)   SpO2 99%   BMI 21.63 kg/m     Wt Readings from Last 3 Encounters:  10/06/24 134 lb (60.8 kg)  07/28/24 133 lb (60.3 kg)  06/23/24 131 lb 6.4 oz (59.6 kg)     GEN: Well nourished, well developed in no acute distress CARDIAC: RRR, no murmurs, rubs, gallops The device site is normal -- no tenderness, edema, drainage, redness, threatened erosion.  RESPIRATORY:  Normal work of breathing MUSCULOSKELETAL: no edema    ASSESSMENT & PLAN:     Inappropriate sinus tachycardia status post ablation Per patient, ablation was performed in [place]  Now lacking sinus node function  Chronotropically incompetent  She is predominantly atrial paced  History of atrial fibrillation She has been maintained on flecainide  Off flecainide , no recurrence of atrial fibrillation this interrogation If she has recurrence, would pursue ablation  [facility] pacemaker Status post sinus node ablation Device is not MRI compatible  Orthostatic intolerance We could increase her heart rate response, but I think that she will be symptomatic with the increased rates She oftentimes does not tolerate compression stockings, which cause discomfort and result in her feet turning blue  Dizziness Per Dr. Fernande, largely nonpositional. She was referred for neurological evaluation by Dr. Fernande   Signed, Dana FORBES Furbish, MD  10/06/2024 2:54 PM    [facility]

## 2024-10-11 ENCOUNTER — Encounter: Payer: Self-pay | Admitting: Cardiovascular Disease

## 2024-10-11 DIAGNOSIS — R55 Syncope and collapse: Secondary | ICD-10-CM | POA: Diagnosis not present

## 2024-10-11 DIAGNOSIS — I480 Paroxysmal atrial fibrillation: Secondary | ICD-10-CM | POA: Diagnosis not present

## 2024-10-11 NOTE — Telephone Encounter (Signed)
 Spoke with patient:   C/o: blood in stool, frequent bowel movements, dry mouth. After Bms yesterday evening she went to kitchen to get a drink.  Felt lightheaded and went down passed out in kitchen. Wants to see if device showed anything.    Device Transmission Reviewed:  Nothing related to device/arrhythmias that was going on with the heart that correlates with symptoms.  Normal Device Function No arrhythmias, no episodes Lead impedance trends - normal Battery is at 6.4 months to ERI will put on monthly remote monitoring for battery.   Discussed with patient that there could be number of reasons for her syncope that the device would not detect:  ie: dehydration, vagal response from BM, low HGB (blood loss).  She needs further work up for her symptoms.      States she sees GI tomorrow and discussed with her NP at her work today about her symptoms.  She will have further follow up/testing tomorrow.

## 2024-10-12 ENCOUNTER — Encounter: Payer: Self-pay | Admitting: Nurse Practitioner

## 2024-10-12 ENCOUNTER — Ambulatory Visit (INDEPENDENT_AMBULATORY_CARE_PROVIDER_SITE_OTHER): Admitting: Nurse Practitioner

## 2024-10-12 VITALS — BP 116/68 | HR 75 | Ht 66.0 in | Wt 137.2 lb

## 2024-10-12 DIAGNOSIS — K625 Hemorrhage of anus and rectum: Secondary | ICD-10-CM

## 2024-10-12 DIAGNOSIS — D649 Anemia, unspecified: Secondary | ICD-10-CM

## 2024-10-12 DIAGNOSIS — Z862 Personal history of diseases of the blood and blood-forming organs and certain disorders involving the immune mechanism: Secondary | ICD-10-CM | POA: Diagnosis not present

## 2024-10-12 DIAGNOSIS — R1010 Upper abdominal pain, unspecified: Secondary | ICD-10-CM

## 2024-10-12 DIAGNOSIS — K219 Gastro-esophageal reflux disease without esophagitis: Secondary | ICD-10-CM | POA: Diagnosis not present

## 2024-10-12 DIAGNOSIS — K5909 Other constipation: Secondary | ICD-10-CM

## 2024-10-12 DIAGNOSIS — R197 Diarrhea, unspecified: Secondary | ICD-10-CM | POA: Diagnosis not present

## 2024-10-12 MED ORDER — HYDROCORTISONE ACETATE 25 MG RE SUPP: 25.0000 mg | Freq: Every day | RECTAL | 0 refills | Status: AC

## 2024-10-12 MED ORDER — NA SULFATE-K SULFATE-MG SULF 17.5-3.13-1.6 GM/177ML PO SOLN: 1.0000 | Freq: Once | ORAL | 0 refills | Status: AC

## 2024-10-12 NOTE — Telephone Encounter (Signed)
 Pt seen in GI today.   Plan below:  ASSESSMENT AND PLAN:   63 year old female with rectal bleeding - Anusol  HC 25 mg suppository 1 PR nightly x 5 nights for internal hemorrhoids - MiraLAX nightly, avoid constipation/straining - Colonoscopy benefits and risks discussed including risk with sedation, risk of bleeding, perforation and infection  -  Recheck CBC in 2 weeks   History of anemia - CBC, IBC + ferritin, B12 and folate level in 2 weeks   Chronic constipation.  Patient endorses going 10 to 12 days without passing a BM which sometimes results in passing numerous bowel movements simultaneously with vomiting. - Avoid constipation.  Miralax  Q HS - Consider CTAP with oral and IV contrast if patient has further episodes of severe constipation with vomiting - TTG and IgA with next lab draw in 2 weeks   History of gastroparesis with early satiety, nausea and infrequent vomiting  - EGD as ordered above - Avoid eating large meals - Further recommendation to be determined after EGD completed   GERD - Pantoprazole  40 mg to be taken 30 minutes before dinner - May take Pepcid OTC 20 mg daily as needed - See plan above   History of atrial fibrillation, not on AC   History of inappropriate sinus tachycardia s/p ablation and s/p pacemaker placement   ADDENDUM: Patient elected to schedule a colonoscopy first, we will schedule an EGD at a later date.

## 2024-10-12 NOTE — Progress Notes (Signed)
 Noted

## 2024-10-12 NOTE — Progress Notes (Addendum)
 "    10/12/2024 Dana Harvey [id number] [birth year]   CHIEF COMPLAINT: Rectal bleeding   HISTORY OF PRESENT ILLNESS: Dana Harvey is a 63 year old female with a past medical history of atrial fibrillation, inappropriate sinus tachycardia s/p ablation and s/p pacemaker placement, hypercholesterolemia, chronic fatigue syndrome, anemia, gastric ulcers 2013, gastroparesis, IBS and colon polyps.  Past cholecystectomy.  She presents her office today for further evaluation regarding stomach pain and rectal bleeding.  She describes feeling a rolling knot in her stomach which is more noticeable when eating and she feels full after eating 3-4 bites of food which sometimes results in feeling like she cannot take a deep breath in.  She endorses having a history of gastroparesis and at some point took Reglan  which was ineffective and assess domperidone provided better symptom relief which she is no longer taking.  She endorses belching a lot.  She has infrequent heartburn.  No dysphagia.  She stated having all of these symptoms when she was previously evaluated by Dr. Eda.  She underwent an EGD 07/22/2021 which showed a small hiatal hernia and no evidence of H. pylori or celiac disease. She has constipation, can go 10 to 12 days without passing a bowel movement which results in sitting on the commode passing a large amount of stool simultaneously vomiting which she attributes to likely having a vasovagal response.  She started taking MiraLAX 03/2024 which has controlled her constipation without any recent episodes of vomiting when passing a BM.  However, MiraLAX causes stomach pain.  Since starting MiraLAX, she passes a brown bowel movement most days and once monthly she eats because she cereal with fiber which results in passing multiple bowel movements, she feels cleaned out.  Since a few days before Thanksgiving, she started passing small to moderate amounts of bright red blood seen in the toilet  water and wrapped around stool and also recently saw mucus with her bowel movement.  She also endorses having abdominal gas bloat for the past 6 months for which she takes Gas-X as needed.  She stated undergoing a colonoscopy in 2017 in [place] , a few colon polyps were removed.  Her father was diagnosed with colon cancer in his 56s.  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 Onita yesterday which showed a WBC 8.10. RBC 4.0. Hg 12.8. HCT 37.8. PLT 214.  On Celebrex 100 mg twice daily.  Taking Pantoprazole  40 mg at bedtime.     Latest Ref Rng & Units 04/25/2022    9:36 AM 04/25/2022    9:28 AM 04/19/2021    9:47 AM  CBC  WBC 4.0 - 10.5 K/uL  8.6  9.8   Hemoglobin 12.0 - 15.0 g/dL 86.6  86.4  86.1   Hematocrit 36.0 - 46.0 % 39.0  39.4  40.7   Platelets 150 - 400 K/uL  182  218         Latest Ref Rng & Units 04/25/2022    9:36 AM 04/25/2022    9:28 AM 04/26/2021   12:32 PM  CMP  Glucose 70 - 99 mg/dL 88  93  95   BUN 6 - 20 mg/dL 7  8  10    Creatinine 0.44 - 1.00 mg/dL 9.29  9.17  9.33   Sodium 135 - 145 mmol/L 140  139  141   Potassium 3.5 - 5.1 mmol/L 3.9  3.8  4.2   Chloride 98 - 111 mmol/L 105  108  104   CO2 22 - 32 mmol/L  24  23   Calcium  8.9 - 10.3 mg/dL  9.5  9.9   Total Protein 6.5 - 8.1 g/dL  6.1    Total Bilirubin 0.3 - 1.2 mg/dL  0.8    Alkaline Phos 38 - 126 U/L  70    AST 15 - 41 U/L  20    ALT 0 - 44 U/L  20      ECHO 08/18/2023: Left ventricular ejection fraction, by estimation, is 55 to 60%. Left ventricular ejection fraction by 3D volume is 57 %. The left ventricle has normal function. The left ventricle has no regional wall motion abnormalities. Left ventricular diastolic parameters were normal. The average left ventricular global longitudinal strain is -18.1 %. The global longitudinal strain is normal. 1. Right ventricular systolic function is normal. The right ventricular size is normal. There is normal pulmonary artery  systolic pressure. The estimated right ventricular systolic pressure is 16.0 mmHg. 2. The mitral valve is grossly normal. No evidence of mitral valve regurgitation. No evidence of mitral stenosis. 3. The aortic valve is tricuspid. Aortic valve regurgitation is not visualized. No aortic stenosis is present. 4. The inferior vena cava is normal in size with greater than 50% respiratory variability, suggesting right atrial pressure of 3 mmHg  GI PROCEDURES:  EGD 07/22/2021 by Dr. Eda: - Normal esophagus. Biopsied.  - Small hiatal hernia. - Normal stomach.  - Normal examined duodenum. Biopsied.  - The examination was otherwise normal.   Diagnosis 1. Surgical [P], duodenal - DUODENAL MUCOSA WITH NO SPECIFIC HISTOPATHOLOGIC CHANGES - NEGATIVE FOR INCREASED INTRAEPITHELIAL LYMPHOCYTES OR VILLOUS ARCHITECTURAL CHANGES 2. Surgical [P], gastric - GASTRIC ANTRAL AND OXYNTIC MUCOSA WITH NO SPECIFIC HISTOPATHOLOGIC CHANGES - WARTHIN STARRY STAIN IS NEGATIVE FOR HELICOBACTER PYLORI 3. Surgical [P], distal esophagus - ESOPHAGEAL SQUAMOUS AND CARDIAC MUCOSA WITH NO SPECIFIC HISTOPATHOLOGIC CHANGES - NEGATIVE FOR INCREASED INTRAEPITHELIAL EOSINOPHILS - NEGATIVE FOR INTESTINAL METAPLASIA OR DYSPLASIA 4. Surgical [P], proximal esophageal - ESOPHAGEAL SQUAMOUS MUCOSA WITH NO SPECIFIC HISTOPATHOLOGIC CHANGES - NEGATIVE FOR INCREASED INTRAEPITHELIAL EOSINOPHIL  Past Medical History:  Diagnosis Date   Allergy    Anxiety    Chest pain at rest    normal cors on cath in 2012. Normal coronary CT in 2018. Normal stress test in 05/2019   CVA (cerebral vascular accident) (HCC) 1997   Depression    Diverticulitis    Dyspnea    previous PFT showed normal lung volumes, severe diffusion defect with DLCO 43% predicted   Fainting spell    Frequent headaches    Gastroparesis    GERD (gastroesophageal reflux disease)    Heart murmur    Insomnia    Insomnia    Migraines    Pacemaker    St Jude dual chamber PPM placed on  09/06/2014 by Dr. Evy in [place]   PAF (paroxysmal atrial fibrillation) [facility])    diagnosed by Dr. Evy, remote history, no recurrent   Polyp of intestine 2018   benign per patient   Seizures (HCC)    secondary to potassium level dropping (due to opioid medications per patient)   Thyroid  disease    Past Surgical History:  Procedure Laterality Date   ABDOMINAL HYSTERECTOMY  1993   heavy vaginal bleeding; later went in and removed Right ovary   APPENDECTOMY  1984   ATRIAL ABLATION SURGERY  2012   BREAST EXCISIONAL BIOPSY     bilateral; benign   CARDIAC CATHETERIZATION  2012   normal cors on cath in [facility] tx   CESAREAN SECTION     [phone]   CHOLECYSTECTOMY  2010   ELECTROPHYSIOLOGY STUDY  around 2013   with ablation for clinically suspected inappropriate sinus tachycardia, by Dr. Willistine   LOOP RECORDER IMPLANT  2013   by Dr. Willistine   PACEMAKER INSERTION  09/06/2014   St Jude Assurity dual chamber PPM, Dr Alm McCain-[place]--per patient; put in after ablation   Social History: She is married.  She has 2 sons and 1 daughter.  She is a engineer, site.  Non-smoker.  No alcohol use.  No drug use.  Family History: Father with diabetes, heart disease and colon cancer diagnosed in his 29s with pancreatic and prostate cancer.  Mother with COPD and ALS.  Allergies[1]    Outpatient Encounter Medications as of 10/12/2024  Medication Sig   ALPRAZolam  (XANAX ) 0.5 MG tablet TAKE 1 TABLET BY MOUTH TWICE A DAY AS NEEDED FOR ANXIETY   atorvastatin  (LIPITOR) 80 MG tablet Take 1 tablet (80 mg total) by mouth daily.   celecoxib (CELEBREX) 100 MG capsule Take 100 mg by mouth 2 (two) times daily.   cholecalciferol (VITAMIN D3) 25 MCG (1000 UNIT) tablet Take 1,000 Units by mouth at bedtime.   furosemide (LASIX) 20 MG tablet Take 20 mg by mouth as needed for fluid.   pantoprazole  (PROTONIX ) 40 MG tablet Take 2 tablets (80 mg total) by mouth daily.   vitamin E 45 MG (100  UNITS) capsule Take 100 Units by mouth daily.   zolpidem  (AMBIEN ) 10 MG tablet TAKE 1 TABLET BY MOUTH EVERY DAY AT BEDTIME AS NEEDED FOR SLEEP Strength: 10 mg   No facility-administered encounter medications on file as of 10/12/2024.   REVIEW OF SYSTEMS:  Gen: + Fatigue and night sweats. No weight loss.  CV: + Feet swelling. Denies chest pain or palpitations. Resp: Denies cough, shortness of breath of hemoptysis.  GI: See HPI. GU: Denies urinary burning, blood in urine, increased urinary frequency or incontinence. MS: Denies joint pain, muscles aches or weakness. Derm: Denies rash, itchiness, skin lesions or unhealing ulcers. Psych: Denies depression, anxiety, memory loss or confusion. Heme: Denies bruising, easy bleeding. Neuro:  Denies headaches, dizziness or paresthesias. Endo:  Denies any problems with DM, thyroid  or adrenal function.  PHYSICAL EXAM: BP 116/68 (BP Location: Left Arm, Patient Position: Sitting, Cuff Size: Normal)   Pulse 75   Ht 5' 6 (1.676 m)   Wt 137 lb 4 oz (62.3 kg)   BMI 22.15 kg/m   General: 63 year old female in no acute distress. Head: Normocephalic and atraumatic. Eyes:  Sclerae non-icteric, conjunctive pink. Ears: Normal auditory acuity. Mouth: Dentition intact. No ulcers or lesions. Right oral cavity with right sided jaw/bony prominence. Neck: Supple, no lymphadenopathy or thyromegaly.  Lungs: Clear bilaterally to auscultation without wheezes, crackles or rhonchi. Heart: Regular rate and rhythm. No murmur, rub or gallop appreciated.  Abdomen: Soft, nontender, nondistended. No masses. No hepatosplenomegaly. Normoactive bowel sounds x 4 quadrants.  Rectal: Thickened anal folds.  No anal fissure.  Internal hemorrhoids palpated without prolapse.  No mass within reach.  No stool or blood in the rectal vault.  Juanita CMA present during exam. Musculoskeletal: Symmetrical with no gross deformities. Skin: Warm and dry. No rash or lesions on visible  extremities. Extremities: No edema. Neurological: Alert oriented x 4, no focal deficits.  Psychological: Alert and cooperative. Normal mood and affect.  ASSESSMENT AND PLAN:  63 year old female with rectal  bleeding - Anusol  HC 25 mg suppository 1 PR nightly x 5 nights for internal hemorrhoids - MiraLAX nightly, avoid constipation/straining - Colonoscopy benefits and risks discussed including risk with sedation, risk of bleeding, perforation and infection  -  Recheck CBC in 2 weeks  History of anemia - CBC, IBC + ferritin, B12 and folate level in 2 weeks  Chronic constipation.  Patient endorses going 10 to 12 days without passing a BM which sometimes results in passing numerous bowel movements simultaneously with vomiting. - Avoid constipation.  Miralax  Q HS - Consider CTAP with oral and IV contrast if patient has further episodes of severe constipation with vomiting - TTG and IgA with next lab draw in 2 weeks  History of gastroparesis with early satiety, nausea and infrequent vomiting  - EGD as ordered above - Avoid eating large meals - Further recommendation to be determined after EGD completed  GERD - Pantoprazole  40 mg to be taken 30 minutes before dinner - May take Pepcid OTC 20 mg daily as needed - See plan above  History of colon polyps. - Request copy of colonoscopy procedure and biopsy results 2017 - Colonoscopy as ordered above  History of atrial fibrillation, not on AC  History of inappropriate sinus tachycardia s/p ablation and s/p pacemaker placement  ADDENDUM: Patient elected to schedule a colonoscopy first, we will schedule an EGD at a later date.  ADDENDUM: Labs from PCP 10/11/2024 received as follows: WBC 8.10.  Hemoglobin 12.8.  Hematocrit 37.8.  Platelets 214. Labs 06/09/2024: Glucose 104.  BUN 12.  Creatinine 0.6.  CRP < 1.  CC:  Onita Rush, MD       [1]  Allergies Allergen Reactions   Blueberry [Vaccinium Angustifolium] Anaphylaxis    Metoprolol Shortness Of Breath, Anxiety, Palpitations and Other (See Comments)     Hallucinations, low BP   Morphine Anaphylaxis    Cardiac arrest   Nystatin Anaphylaxis and Cough   Penicillins Anaphylaxis    Per Pt lost hearing for a few days   Aspirin  Itching and Tinitus   Crestor [Rosuvastatin Calcium ] Other (See Comments)    Pain in legs   Diltiazem  Other (See Comments)   Fleet Enema [Enema] Diarrhea    vomiting   Fluconazole Cough   Gluten Meal Diarrhea and Nausea And Vomiting   Midodrine  Hcl Other (See Comments)    Tachycardia per patient Suicidal thoughts   Miralax [Polyethylene Glycol (Macrogol)] Diarrhea    Vomiting, patient states this causes a-fib and leads to syncope   Motegrity  [Prucalopride] Other (See Comments)    Suicidal thoughts, fatigue    Niacin     Other Reaction(s): nausea and vomiting, cough, sob   Wheat Other (See Comments)    Hiccups    Other Rash    Band-Aid    "

## 2024-10-12 NOTE — Patient Instructions (Addendum)
 We have sent the following medications to your pharmacy for you to pick up at your convenience:  Anusol  HC suppositories -use at bedtime for 5 nights.  Go to the lab in the basement in approximately two weeks.  Take Pantoprazole  30 minutes before dinner.  Take OTC Pepcid 20mg  daily  You have been scheduled for an endoscopy and colonoscopy. Please follow the written instructions given to you at your visit today.  If you use inhalers (even only as needed), please bring them with you on the day of your procedure.  DO NOT TAKE 7 DAYS PRIOR TO TEST- Trulicity (dulaglutide) Ozempic, Wegovy (semaglutide) Mounjaro, Zepbound (tirzepatide) Bydureon Bcise (exanatide extended release)  DO NOT TAKE 1 DAY PRIOR TO YOUR TEST Rybelsus (semaglutide) Adlyxin (lixisenatide) Victoza (liraglutide) Byetta (exanatide) ___________________________________________________________________________

## 2024-10-14 ENCOUNTER — Ambulatory Visit: Attending: Internal Medicine

## 2024-10-17 NOTE — Telephone Encounter (Signed)
 Linda/Dottie:  Pls contact patient and let her know I reviewed her photos. Patient to return to our lab next week for a CBC, TTG and IGA level.  Patient to proceed with a colonoscopy 10/28/2024 as planned. Compete Anusol  HC suppositories x 5 nights as prescribed at the time of her office visit. Miralax to avoid straining/constipation. Patient to contact office if rectal bleeding worsens. THX.

## 2024-10-19 LAB — CUP PACEART INCLINIC DEVICE CHECK
Battery Remaining Longevity: 14 mo
Battery Voltage: 2.86 V
Brady Statistic RA Percent Paced: 93 %
Brady Statistic RV Percent Paced: 0.1 %
Date Time Interrogation Session: 20251211145900
Implantable Lead Connection Status: 753985
Implantable Lead Connection Status: 753985
Implantable Lead Implant Date: 20151111
Implantable Lead Implant Date: 20151111
Implantable Lead Location: 753859
Implantable Lead Location: 753860
Implantable Pulse Generator Implant Date: 20151111
Lead Channel Impedance Value: 462.5 Ohm
Lead Channel Impedance Value: 475 Ohm
Lead Channel Pacing Threshold Amplitude: 0.5 V
Lead Channel Pacing Threshold Amplitude: 0.5 V
Lead Channel Pacing Threshold Pulse Width: 0.4 ms
Lead Channel Pacing Threshold Pulse Width: 0.4 ms
Lead Channel Sensing Intrinsic Amplitude: 10.8 mV
Lead Channel Sensing Intrinsic Amplitude: 5 mV
Lead Channel Setting Pacing Amplitude: 2 V
Lead Channel Setting Pacing Amplitude: 2.5 V
Lead Channel Setting Pacing Pulse Width: 0.4 ms
Lead Channel Setting Sensing Sensitivity: 0.5 mV
Pulse Gen Model: 2240
Pulse Gen Serial Number: 7694052

## 2024-10-24 ENCOUNTER — Ambulatory Visit: Payer: Self-pay | Admitting: Cardiovascular Disease

## 2024-10-28 ENCOUNTER — Ambulatory Visit: Admitting: Internal Medicine

## 2024-10-28 ENCOUNTER — Encounter: Payer: Self-pay | Admitting: Internal Medicine

## 2024-10-28 VITALS — BP 119/72 | HR 75 | Temp 97.9°F | Resp 17 | Ht 66.0 in | Wt 137.0 lb

## 2024-10-28 DIAGNOSIS — R609 Edema, unspecified: Secondary | ICD-10-CM | POA: Insufficient documentation

## 2024-10-28 DIAGNOSIS — E559 Vitamin D deficiency, unspecified: Secondary | ICD-10-CM | POA: Insufficient documentation

## 2024-10-28 DIAGNOSIS — J309 Allergic rhinitis, unspecified: Secondary | ICD-10-CM | POA: Insufficient documentation

## 2024-10-28 DIAGNOSIS — R0609 Other forms of dyspnea: Secondary | ICD-10-CM | POA: Insufficient documentation

## 2024-10-28 DIAGNOSIS — J029 Acute pharyngitis, unspecified: Secondary | ICD-10-CM | POA: Insufficient documentation

## 2024-10-28 DIAGNOSIS — D122 Benign neoplasm of ascending colon: Secondary | ICD-10-CM | POA: Diagnosis not present

## 2024-10-28 DIAGNOSIS — Z860100 Personal history of colon polyps, unspecified: Secondary | ICD-10-CM

## 2024-10-28 DIAGNOSIS — M797 Fibromyalgia: Secondary | ICD-10-CM | POA: Insufficient documentation

## 2024-10-28 DIAGNOSIS — D649 Anemia, unspecified: Secondary | ICD-10-CM | POA: Insufficient documentation

## 2024-10-28 DIAGNOSIS — K648 Other hemorrhoids: Secondary | ICD-10-CM | POA: Diagnosis not present

## 2024-10-28 DIAGNOSIS — R591 Generalized enlarged lymph nodes: Secondary | ICD-10-CM | POA: Insufficient documentation

## 2024-10-28 DIAGNOSIS — Z0000 Encounter for general adult medical examination without abnormal findings: Secondary | ICD-10-CM | POA: Insufficient documentation

## 2024-10-28 DIAGNOSIS — G43009 Migraine without aura, not intractable, without status migrainosus: Secondary | ICD-10-CM | POA: Insufficient documentation

## 2024-10-28 DIAGNOSIS — E039 Hypothyroidism, unspecified: Secondary | ICD-10-CM | POA: Insufficient documentation

## 2024-10-28 DIAGNOSIS — M5430 Sciatica, unspecified side: Secondary | ICD-10-CM | POA: Insufficient documentation

## 2024-10-28 DIAGNOSIS — F4321 Adjustment disorder with depressed mood: Secondary | ICD-10-CM | POA: Insufficient documentation

## 2024-10-28 DIAGNOSIS — R600 Localized edema: Secondary | ICD-10-CM | POA: Insufficient documentation

## 2024-10-28 DIAGNOSIS — G43909 Migraine, unspecified, not intractable, without status migrainosus: Secondary | ICD-10-CM | POA: Insufficient documentation

## 2024-10-28 DIAGNOSIS — K625 Hemorrhage of anus and rectum: Secondary | ICD-10-CM

## 2024-10-28 DIAGNOSIS — R059 Cough, unspecified: Secondary | ICD-10-CM | POA: Insufficient documentation

## 2024-10-28 DIAGNOSIS — H547 Unspecified visual loss: Secondary | ICD-10-CM | POA: Insufficient documentation

## 2024-10-28 DIAGNOSIS — F445 Conversion disorder with seizures or convulsions: Secondary | ICD-10-CM | POA: Insufficient documentation

## 2024-10-28 DIAGNOSIS — Z800 Family history of malignant neoplasm of digestive organs: Secondary | ICD-10-CM

## 2024-10-28 DIAGNOSIS — Z807 Family history of other malignant neoplasms of lymphoid, hematopoietic and related tissues: Secondary | ICD-10-CM | POA: Insufficient documentation

## 2024-10-28 DIAGNOSIS — N3289 Other specified disorders of bladder: Secondary | ICD-10-CM | POA: Insufficient documentation

## 2024-10-28 DIAGNOSIS — R636 Underweight: Secondary | ICD-10-CM | POA: Insufficient documentation

## 2024-10-28 DIAGNOSIS — Z8673 Personal history of transient ischemic attack (TIA), and cerebral infarction without residual deficits: Secondary | ICD-10-CM | POA: Insufficient documentation

## 2024-10-28 DIAGNOSIS — R590 Localized enlarged lymph nodes: Secondary | ICD-10-CM | POA: Insufficient documentation

## 2024-10-28 DIAGNOSIS — M791 Myalgia, unspecified site: Secondary | ICD-10-CM | POA: Insufficient documentation

## 2024-10-28 DIAGNOSIS — R5383 Other fatigue: Secondary | ICD-10-CM | POA: Insufficient documentation

## 2024-10-28 DIAGNOSIS — I8390 Asymptomatic varicose veins of unspecified lower extremity: Secondary | ICD-10-CM | POA: Insufficient documentation

## 2024-10-28 DIAGNOSIS — I700 Atherosclerosis of aorta: Secondary | ICD-10-CM | POA: Insufficient documentation

## 2024-10-28 MED ORDER — SODIUM CHLORIDE 0.9 % IV SOLN: 500.0000 mL | Freq: Once | INTRAVENOUS | Status: DC

## 2024-10-28 NOTE — Progress Notes (Signed)
 Sedate, gd SR, tolerated procedure well, VSS, report to RN

## 2024-10-28 NOTE — Progress Notes (Signed)
 No seizure activity since 2012 per pt

## 2024-10-28 NOTE — Patient Instructions (Addendum)
 Resume previous diet Continue present medications Await pathology results EGD on Thursday 12/08/24 at 1:30 PM Telephone previsit on 11/17/24 nurse will call you at 2 PM Repeat colonoscopy in 5 years due to family history   Handouts/information given for polyps and hemorrhoids  YOU HAD AN ENDOSCOPIC PROCEDURE TODAY AT THE [facility]:   Refer to the procedure report that was given to you for any specific questions about what was found during the examination.  If the procedure report does not answer your questions, please call your gastroenterologist to clarify.  If you requested that your care partner not be given the details of your procedure findings, then the procedure report has been included in a sealed envelope for you to review at your convenience later.  YOU SHOULD EXPECT: Some feelings of bloating in the abdomen. Passage of more gas than usual.  Walking can help get rid of the air that was put into your GI tract during the procedure and reduce the bloating. If you had a lower endoscopy (such as a colonoscopy or flexible sigmoidoscopy) you may notice spotting of blood in your stool or on the toilet paper. If you underwent a bowel prep for your procedure, you may not have a normal bowel movement for a few days.  Please Note:  You might notice some irritation and congestion in your nose or some drainage.  This is from the oxygen used during your procedure.  There is no need for concern and it should clear up in a day or so.  SYMPTOMS TO REPORT IMMEDIATELY:  Following lower endoscopy (colonoscopy):  Excessive amounts of blood in the stool  Significant tenderness or worsening of abdominal pains  Swelling of the abdomen that is new, acute  Fever of 100F or higher For urgent or emergent issues, a gastroenterologist can be reached at any hour by calling [phone]. Do not use MyChart messaging for urgent concerns.   DIET:  We do recommend a small meal at first, but then you  may proceed to your regular diet.  Drink plenty of fluids but you should avoid alcoholic beverages for 24 hours.  ACTIVITY:  You should plan to take it easy for the rest of today and you should NOT DRIVE or use heavy machinery until tomorrow (because of the sedation medicines used during the test).    FOLLOW UP: Our staff will call the number listed on your records the next business day following your procedure.  We will call around 7:15- 8:00 am to check on you and address any questions or concerns that you may have regarding the information given to you following your procedure. If we do not reach you, we will leave a message.     If any biopsies were taken you will be contacted by phone or by letter within the next 1-3 weeks.  Please call us  at [phone] if you have not heard about the biopsies in 3 weeks.    SIGNATURES/CONFIDENTIALITY: You and/or your care partner have signed paperwork which will be entered into your electronic medical record.  These signatures attest to the fact that that the information above on your After Visit Summary has been reviewed and is understood.  Full responsibility of the confidentiality of this discharge information lies with you and/or your care-partner.

## 2024-10-28 NOTE — Progress Notes (Signed)
 Called to room to assist during endoscopic procedure.  Patient ID and intended procedure confirmed with present staff. Received instructions for my participation in the procedure from the performing physician.

## 2024-10-28 NOTE — Op Note (Signed)
 [facility] Patient Name: Dana Harvey Procedure Date: 10/28/2024 2:25 PM MRN: [id number] Endoscopist: Norleen SAILOR. Abran , MD, [id number] Age: 64 Referring MD:  Date of Birth: Jan 15, 1961 Gender: Female Account #: [id number] Procedure:                Colonoscopy with cold snare polypectomy x 1 Indications:              Rectal bleeding, father with colon cancer in 60s,                            reports colonoscopy elsewhere in 2017 with                            polypstype unknown. Also reports iron deficiency                            and upper abdominal pain postprandially Medicines:                Monitored Anesthesia Care Procedure:                Pre-Anesthesia Assessment:                           - Prior to the procedure, a History and Physical                            was performed, and patient medications and                            allergies were reviewed. The patient's tolerance of                            previous anesthesia was also reviewed. The risks                            and benefits of the procedure and the sedation                            options and risks were discussed with the patient.                            All questions were answered, and informed consent                            was obtained. Prior Anticoagulants: The patient has                            taken no anticoagulant or antiplatelet agents. ASA                            Grade Assessment: II - A patient with mild systemic                            disease. After reviewing the risks and benefits,  the patient was deemed in satisfactory condition to                            undergo the procedure.                           After obtaining informed consent, the colonoscope                            was passed under direct vision. Throughout the                            procedure, the patient's blood pressure, pulse, and                             oxygen saturations were monitored continuously. The                            Olympus Scope SN: [id number] was introduced through                            the anus and advanced to the the cecum, identified                            by appendiceal orifice and ileocecal valve. The                            ileocecal valve, appendiceal orifice, and rectum                            were photographed. The quality of the bowel                            preparation was excellent. The colonoscopy was                            performed without difficulty. The patient tolerated                            the procedure well. The bowel preparation used was                            SUPREP via split dose instruction. Scope In: 3:05:58 PM Scope Out: 3:18:25 PM Scope Withdrawal Time: 0 hours 9 minutes 38 seconds  Total Procedure Duration: 0 hours 12 minutes 27 seconds  Findings:                 A 3 mm polyp was found in the ascending colon. The                            polyp was sessile. The polyp was removed with a                            cold snare. Resection and retrieval  were complete.                           Internal hemorrhoids were found during                            retroflexion. The hemorrhoids were moderate.                           The exam was otherwise without abnormality on                            direct and retroflexion views. Complications:            No immediate complications. Estimated blood loss:                            None. Estimated Blood Loss:     Estimated blood loss: none. Impression:               - One 3 mm polyp in the ascending colon, removed                            with a cold snare. Resected and retrieved.                           - Internal hemorrhoids.                           - The examination was otherwise normal on direct                            and retroflexion views. Recommendation:           - Repeat colonoscopy in 5  years for surveillance                            (family history).                           - Patient has a contact number available for                            emergencies. The signs and symptoms of potential                            delayed complications were discussed with the                            patient. Return to normal activities tomorrow.                            Written discharge instructions were provided to the                            patient.                           -  Resume previous diet.                           - Continue present medications.                           - Await pathology results.                           - PLEASE SCHEDULE EGD in the [facility] postprandial                            abdominal pain, iron deficiency, GERD Vernetta Dizdarevic N. Abran, MD 10/28/2024 3:25:32 PM This report has been signed electronically.

## 2024-10-28 NOTE — Progress Notes (Signed)
 Expand All Collapse All        10/12/2024 Dana Harvey [id number] 06/16/1961     CHIEF COMPLAINT: Rectal bleeding    HISTORY OF PRESENT ILLNESS: Dana Harvey is a 64 year old female with a past medical history of atrial fibrillation, inappropriate sinus tachycardia s/p ablation and s/p pacemaker placement, hypercholesterolemia, chronic fatigue syndrome, anemia, gastric ulcers 2013, gastroparesis, IBS and colon polyps.  Past cholecystectomy.   She presents her office today for further evaluation regarding stomach pain and rectal bleeding.  She describes feeling a rolling knot in her stomach which is more noticeable when eating and she feels full after eating 3-4 bites of food which sometimes results in feeling like she cannot take a deep breath in.  She endorses having a history of gastroparesis and at some point took Reglan  which was ineffective and assess domperidone provided better symptom relief which she is no longer taking.  She endorses belching a lot.  She has infrequent heartburn.  No dysphagia.  She stated having all of these symptoms when she was previously evaluated by Dr. Eda.  She underwent an EGD 07/22/2021 which showed a small hiatal hernia and no evidence of H. pylori or celiac disease. She has constipation, can go 10 to 12 days without passing a bowel movement which results in sitting on the commode passing a large amount of stool simultaneously vomiting which she attributes to likely having a vasovagal response.  She started taking MiraLAX 03/2024 which has controlled her constipation without any recent episodes of vomiting when passing a BM.  However, MiraLAX causes stomach pain.  Since starting MiraLAX, she passes a brown bowel movement most days and once monthly she eats because she cereal with fiber which results in passing multiple bowel movements, she feels cleaned out.  Since a few days before Thanksgiving, she started passing small to moderate amounts of  bright red blood seen in the toilet water and wrapped around stool and also recently saw mucus with her bowel movement.  She also endorses having abdominal gas bloat for the past 6 months for which she takes Gas-X as needed.  She stated undergoing a colonoscopy in 2017 in [place] , a few colon polyps were removed.  Her father was diagnosed with colon cancer in his 34s.  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 Onita yesterday which showed a WBC 8.10. RBC 4.0. Hg 12.8. HCT 37.8. PLT 214.  On Celebrex 100 mg twice daily.  Taking Pantoprazole  40 mg at bedtime.       Latest Ref Rng & Units 04/25/2022    9:36 AM 04/25/2022    9:28 AM 04/19/2021    9:47 AM  CBC  WBC 4.0 - 10.5 K/uL   8.6  9.8   Hemoglobin 12.0 - 15.0 g/dL 86.6  86.4  86.1   Hematocrit 36.0 - 46.0 % 39.0  39.4  40.7   Platelets 150 - 400 K/uL   182  218           Latest Ref Rng & Units 04/25/2022    9:36 AM 04/25/2022    9:28 AM 04/26/2021   12:32 PM  CMP  Glucose 70 - 99 mg/dL 88  93  95   BUN 6 - 20 mg/dL 7  8  10    Creatinine 0.44 - 1.00 mg/dL 9.29  9.17  9.33   Sodium 135 - 145 mmol/L 140  139  141   Potassium 3.5 -  5.1 mmol/L 3.9  3.8  4.2   Chloride 98 - 111 mmol/L 105  108  104   CO2 22 - 32 mmol/L   24  23   Calcium  8.9 - 10.3 mg/dL   9.5  9.9   Total Protein 6.5 - 8.1 g/dL   6.1     Total Bilirubin 0.3 - 1.2 mg/dL   0.8     Alkaline Phos 38 - 126 U/L   70     AST 15 - 41 U/L   20     ALT 0 - 44 U/L   20       ECHO 08/18/2023: Left ventricular ejection fraction, by estimation, is 55 to 60%. Left ventricular ejection fraction by 3D volume is 57 %. The left ventricle has normal function. The left ventricle has no regional wall motion abnormalities. Left ventricular diastolic parameters were normal. The average left ventricular global longitudinal strain is -18.1 %. The global longitudinal strain is normal. 1. Right ventricular systolic function is normal. The right ventricular  size is normal. There is normal pulmonary artery systolic pressure. The estimated right ventricular systolic pressure is 16.0 mmHg. 2. The mitral valve is grossly normal. No evidence of mitral valve regurgitation. No evidence of mitral stenosis. 3. The aortic valve is tricuspid. Aortic valve regurgitation is not visualized. No aortic stenosis is present. 4. The inferior vena cava is normal in size with greater than 50% respiratory variability, suggesting right atrial pressure of 3 mmHg   GI PROCEDURES:   EGD 07/22/2021 by Dr. Eda: - Normal esophagus. Biopsied.  - Small hiatal hernia. - Normal stomach.  - Normal examined duodenum. Biopsied.  - The examination was otherwise normal.    Diagnosis 1. Surgical [P], duodenal - DUODENAL MUCOSA WITH NO SPECIFIC HISTOPATHOLOGIC CHANGES - NEGATIVE FOR INCREASED INTRAEPITHELIAL LYMPHOCYTES OR VILLOUS ARCHITECTURAL CHANGES 2. Surgical [P], gastric - GASTRIC ANTRAL AND OXYNTIC MUCOSA WITH NO SPECIFIC HISTOPATHOLOGIC CHANGES - WARTHIN STARRY STAIN IS NEGATIVE FOR HELICOBACTER PYLORI 3. Surgical [P], distal esophagus - ESOPHAGEAL SQUAMOUS AND CARDIAC MUCOSA WITH NO SPECIFIC HISTOPATHOLOGIC CHANGES - NEGATIVE FOR INCREASED INTRAEPITHELIAL EOSINOPHILS - NEGATIVE FOR INTESTINAL METAPLASIA OR DYSPLASIA 4. Surgical [P], proximal esophageal - ESOPHAGEAL SQUAMOUS MUCOSA WITH NO SPECIFIC HISTOPATHOLOGIC CHANGES - NEGATIVE FOR INCREASED INTRAEPITHELIAL EOSINOPHIL       Past Medical History:  Diagnosis Date   Allergy     Anxiety     Chest pain at rest      normal cors on cath in 2012. Normal coronary CT in 2018. Normal stress test in 05/2019   CVA (cerebral vascular accident) (HCC) 1997   Depression     Diverticulitis     Dyspnea      previous PFT showed normal lung volumes, severe diffusion defect with DLCO 43% predicted   Fainting spell     Frequent headaches     Gastroparesis     GERD (gastroesophageal reflux disease)     Heart murmur     Insomnia      Insomnia     Migraines     Pacemaker      St Jude dual chamber PPM placed on 09/06/2014 by Dr. Evy in [place]   PAF (paroxysmal atrial fibrillation) [facility])      diagnosed by Dr. Evy, remote history, no recurrent   Polyp of intestine 2018    benign per patient   Seizures (HCC)      secondary to potassium level dropping (due to opioid medications per  patient)   Thyroid  disease               Past Surgical History:  Procedure Laterality Date   ABDOMINAL HYSTERECTOMY   1993    heavy vaginal bleeding; later went in and removed Right ovary   APPENDECTOMY   1984   ATRIAL ABLATION SURGERY   2012   BREAST EXCISIONAL BIOPSY        bilateral; benign   CARDIAC CATHETERIZATION   2012    normal cors on cath in [facility] [place]   CESAREAN SECTION        [id number]   CHOLECYSTECTOMY   2010   ELECTROPHYSIOLOGY STUDY   around 2013    with ablation for clinically suspected inappropriate sinus tachycardia, by Dr. Willistine   LOOP RECORDER IMPLANT   2013    by Dr. Willistine   PACEMAKER INSERTION   09/06/2014    St Jude Assurity dual chamber PPM, Dr Alm [place]--per patient; put in after ablation        Social History: She is married.  She has 2 sons and 1 daughter.  She is a engineer, site.  Non-smoker.  No alcohol use.  No drug use.   Family History: Father with diabetes, heart disease and colon cancer diagnosed in his 36s with pancreatic and prostate cancer.  Mother with COPD and ALS.   [Allergies]  [Allergies]      Allergen Reactions   Blueberry [Vaccinium Angustifolium] Anaphylaxis   Metoprolol Shortness Of Breath, Anxiety, Palpitations and Other (See Comments)       Hallucinations, low BP   Morphine Anaphylaxis      Cardiac arrest   Nystatin Anaphylaxis and Cough   Penicillins Anaphylaxis      Per Pt lost hearing for a few days   Aspirin  Itching and Tinitus   Crestor [Rosuvastatin Calcium ] Other (See Comments)      Pain in legs   Diltiazem  Other (See  Comments)   Fleet Enema [Enema] Diarrhea      vomiting   Fluconazole Cough   Gluten Meal Diarrhea and Nausea And Vomiting   Midodrine  Hcl Other (See Comments)      Tachycardia per patient Suicidal thoughts   Miralax [Polyethylene Glycol (Macrogol)] Diarrhea      Vomiting, patient states this causes a-fib and leads to syncope   Motegrity  [Prucalopride] Other (See Comments)      Suicidal thoughts, fatigue    Niacin        Other Reaction(s): nausea and vomiting, cough, sob   Wheat Other (See Comments)      Hiccups     Other Rash      Band-Aid            Outpatient Encounter Medications as of 10/12/2024  Medication Sig   ALPRAZolam  (XANAX ) 0.5 MG tablet TAKE 1 TABLET BY MOUTH TWICE A DAY AS NEEDED FOR ANXIETY   atorvastatin  (LIPITOR) 80 MG tablet Take 1 tablet (80 mg total) by mouth daily.   celecoxib (CELEBREX) 100 MG capsule Take 100 mg by mouth 2 (two) times daily.   cholecalciferol (VITAMIN D3) 25 MCG (1000 UNIT) tablet Take 1,000 Units by mouth at bedtime.   furosemide (LASIX) 20 MG tablet Take 20 mg by mouth as needed for fluid.   pantoprazole  (PROTONIX ) 40 MG tablet Take 2 tablets (80 mg total) by mouth daily.   vitamin E 45 MG (100 UNITS) capsule Take 100 Units by mouth daily.   zolpidem  (AMBIEN ) 10 MG tablet TAKE 1  TABLET BY MOUTH EVERY DAY AT BEDTIME AS NEEDED FOR SLEEP Strength: 10 mg      No facility-administered encounter medications on file as of 10/12/2024.      REVIEW OF SYSTEMS:  Gen: + Fatigue and night sweats. No weight loss.  CV: + Feet swelling. Denies chest pain or palpitations. Resp: Denies cough, shortness of breath of hemoptysis.  GI: See HPI. GU: Denies urinary burning, blood in urine, increased urinary frequency or incontinence. MS: Denies joint pain, muscles aches or weakness. Derm: Denies rash, itchiness, skin lesions or unhealing ulcers. Psych: Denies depression, anxiety, memory loss or confusion. Heme: Denies bruising, easy bleeding. Neuro:   Denies headaches, dizziness or paresthesias. Endo:  Denies any problems with DM, thyroid  or adrenal function.   PHYSICAL EXAM: BP 116/68 (BP Location: Left Arm, Patient Position: Sitting, Cuff Size: Normal)   Pulse 75   Ht 5' 6 (1.676 m)   Wt 137 lb 4 oz (62.3 kg)   BMI 22.15 kg/m    General: 64 year old female in no acute distress. Head: Normocephalic and atraumatic. Eyes:  Sclerae non-icteric, conjunctive pink. Ears: Normal auditory acuity. Mouth: Dentition intact. No ulcers or lesions. Right oral cavity with right sided jaw/bony prominence. Neck: Supple, no lymphadenopathy or thyromegaly.  Lungs: Clear bilaterally to auscultation without wheezes, crackles or rhonchi. Heart: Regular rate and rhythm. No murmur, rub or gallop appreciated.  Abdomen: Soft, nontender, nondistended. No masses. No hepatosplenomegaly. Normoactive bowel sounds x 4 quadrants.  Rectal: Thickened anal folds.  No anal fissure.  Internal hemorrhoids palpated without prolapse.  No mass within reach.  No stool or blood in the rectal vault.  Juanita CMA present during exam. Musculoskeletal: Symmetrical with no gross deformities. Skin: Warm and dry. No rash or lesions on visible extremities. Extremities: No edema. Neurological: Alert oriented x 4, no focal deficits.  Psychological: Alert and cooperative. Normal mood and affect.   ASSESSMENT AND PLAN:   64 year old female with rectal bleeding - Anusol  HC 25 mg suppository 1 PR nightly x 5 nights for internal hemorrhoids - MiraLAX nightly, avoid constipation/straining - Colonoscopy benefits and risks discussed including risk with sedation, risk of bleeding, perforation and infection  -  Recheck CBC in 2 weeks   History of anemia - CBC, IBC + ferritin, B12 and folate level in 2 weeks   Chronic constipation.  Patient endorses going 10 to 12 days without passing a BM which sometimes results in passing numerous bowel movements simultaneously with vomiting. - Avoid  constipation.  Miralax  Q HS - Consider CTAP with oral and IV contrast if patient has further episodes of severe constipation with vomiting - TTG and IgA with next lab draw in 2 weeks   History of gastroparesis with early satiety, nausea and infrequent vomiting  - EGD as ordered above - Avoid eating large meals - Further recommendation to be determined after EGD completed   GERD - Pantoprazole  40 mg to be taken 30 minutes before dinner - May take Pepcid OTC 20 mg daily as needed - See plan above   History of colon polyps. - Request copy of colonoscopy procedure and biopsy results 2017 - Colonoscopy as ordered above   History of atrial fibrillation, not on AC   History of inappropriate sinus tachycardia s/p ablation and s/p pacemaker placement   ADDENDUM: Patient elected to schedule a colonoscopy first, we will schedule an EGD at a later date.     CC:  Onita Rush, MD   Recent office visit as above.  No interval change.  Now for colonoscopy

## 2024-10-31 ENCOUNTER — Telehealth: Payer: Self-pay

## 2024-10-31 NOTE — Telephone Encounter (Signed)
 Returned patient call after my note was addressed by Dr. Abran. Patient states she has not passed any air since her colonoscopy on Friday other that what  her recovery nurse was able to expel with pressure prior to discharge. States abd discomfort is 4/10 under rib cage. Patient has tried warm liquids and heating pad to area as I recommended initially without success. Patient confirms this is a similar discomfort, but not exactly what she was having that led to plan for EGD. Patient is scheduled for an EGD with Dr. Abran 12/08/2024. Patient would like to know if she should be concerned regarding the above symptoms or just needs reassurance this will improve and OK to wait for scheduled EGD to explore further. Message routed to Dr. Abran for additional advisement. Patient verbalizes understanding.

## 2024-10-31 NOTE — Telephone Encounter (Signed)
" °  Follow up Call-     10/28/2024    2:33 PM  Call back number  Post procedure Call Back phone  # [phone]  Permission to leave phone message Yes     Patient questions:  Do you have a fever, pain , or abdominal swelling? Yes.   Pain Score  5 *  Have you tolerated food without any problems? Yes.    Have you been able to return to your normal activities? Yes.    Do you have any questions about your discharge instructions: Diet   No. Medications  No. Follow up visit  No.  Do you have questions or concerns about your Care? Yes.    Patient had colonoscopy with you Friday, Jan 2. States she has only passed a small amount of air since, c/o cramping pressure in stomach, described as a weight below left rib cage at 5/10. Patient took GasX at 0630 this a with a small amount of relief. States she has gone back to eating without difficulty and is currently at work. Suggested warm liquids and heating pad to area of concern as well as continuing GasX as needed. Informed her I will route this message to Dr. Abran and get back with her. Patient verbalizes understanding.  Actions: * If pain score is 4 or above: Physician/ provider Notified : Norleen Abran, MD.   "

## 2024-10-31 NOTE — Telephone Encounter (Signed)
 As long as things do not worsen, continue to monitor. Thanks for the follow-up. Dr.  Abran

## 2024-10-31 NOTE — Telephone Encounter (Signed)
 Called patient back after received most recent advisement from Dr. Abran, no answer, left message to continue to monitor and to call Dr. Nancyann office if her condition worsens as per Dr. Nancyann advisement.

## 2024-11-02 ENCOUNTER — Ambulatory Visit: Payer: Self-pay | Admitting: Internal Medicine

## 2024-11-02 LAB — SURGICAL PATHOLOGY

## 2024-11-14 ENCOUNTER — Ambulatory Visit: Attending: Internal Medicine

## 2024-11-17 ENCOUNTER — Encounter

## 2024-12-08 ENCOUNTER — Encounter: Admitting: Internal Medicine

## 2024-12-12 ENCOUNTER — Ambulatory Visit: Payer: BC Managed Care – PPO

## 2024-12-15 ENCOUNTER — Ambulatory Visit

## 2025-01-16 ENCOUNTER — Ambulatory Visit

## 2025-02-16 ENCOUNTER — Ambulatory Visit

## 2025-03-13 ENCOUNTER — Ambulatory Visit: Payer: BC Managed Care – PPO

## 2025-03-21 ENCOUNTER — Ambulatory Visit

## 2025-04-21 ENCOUNTER — Ambulatory Visit

## 2025-05-22 ENCOUNTER — Ambulatory Visit

## 2025-06-22 ENCOUNTER — Ambulatory Visit
